# Patient Record
Sex: Female | Born: 1945
Health system: Southern US, Community
[De-identification: ages and names within clinical notes are randomized; demographics above are authoritative.]

## PROBLEM LIST (undated history)

## (undated) DIAGNOSIS — E041 Nontoxic single thyroid nodule: Secondary | ICD-10-CM

## (undated) DIAGNOSIS — R6882 Decreased libido: Secondary | ICD-10-CM

## (undated) DIAGNOSIS — N959 Unspecified menopausal and perimenopausal disorder: Secondary | ICD-10-CM

## (undated) DIAGNOSIS — K5790 Diverticulosis of intestine, part unspecified, without perforation or abscess without bleeding: Secondary | ICD-10-CM

## (undated) DIAGNOSIS — K219 Gastro-esophageal reflux disease without esophagitis: Secondary | ICD-10-CM

## (undated) DIAGNOSIS — IMO0002 Reserved for concepts with insufficient information to code with codable children: Secondary | ICD-10-CM

## (undated) DIAGNOSIS — E785 Hyperlipidemia, unspecified: Secondary | ICD-10-CM

## (undated) DIAGNOSIS — K5792 Diverticulitis of intestine, part unspecified, without perforation or abscess without bleeding: Secondary | ICD-10-CM

## (undated) DIAGNOSIS — Z780 Asymptomatic menopausal state: Secondary | ICD-10-CM

## (undated) DIAGNOSIS — M25511 Pain in right shoulder: Secondary | ICD-10-CM

## (undated) DIAGNOSIS — R011 Cardiac murmur, unspecified: Secondary | ICD-10-CM

## (undated) DIAGNOSIS — H25012 Cortical age-related cataract, left eye: Secondary | ICD-10-CM

## (undated) DIAGNOSIS — M8580 Other specified disorders of bone density and structure, unspecified site: Secondary | ICD-10-CM

## (undated) DIAGNOSIS — Z9109 Other allergy status, other than to drugs and biological substances: Secondary | ICD-10-CM

## (undated) DIAGNOSIS — M810 Age-related osteoporosis without current pathological fracture: Secondary | ICD-10-CM

## (undated) DIAGNOSIS — M549 Dorsalgia, unspecified: Secondary | ICD-10-CM

## (undated) DIAGNOSIS — M1990 Unspecified osteoarthritis, unspecified site: Secondary | ICD-10-CM

## (undated) DIAGNOSIS — J189 Pneumonia, unspecified organism: Secondary | ICD-10-CM

## (undated) DIAGNOSIS — N952 Postmenopausal atrophic vaginitis: Secondary | ICD-10-CM

## (undated) DIAGNOSIS — I10 Essential (primary) hypertension: Secondary | ICD-10-CM

## (undated) DIAGNOSIS — M5030 Other cervical disc degeneration, unspecified cervical region: Secondary | ICD-10-CM

## (undated) DIAGNOSIS — N816 Rectocele: Secondary | ICD-10-CM

## (undated) HISTORY — DX: Rectocele: N81.6

## (undated) HISTORY — DX: Asymptomatic menopausal state: Z78.0

## (undated) HISTORY — DX: Other allergy status, other than to drugs and biological substances: Z91.09

## (undated) HISTORY — DX: Postmenopausal atrophic vaginitis: N95.2

## (undated) HISTORY — PX: COLONOSCOPY: SHX5424

## (undated) HISTORY — DX: Essential (primary) hypertension: I10

## (undated) HISTORY — PX: ESOPHAGOGASTRODUODENOSCOPY: SHX1529

## (undated) HISTORY — PX: FOOT SURGERY: SHX648

## (undated) HISTORY — PX: CATARACT EXTRACTION: SUR2

## (undated) HISTORY — DX: Unspecified osteoarthritis, unspecified site: M19.90

## (undated) HISTORY — PX: CRYOTHERAPY: SHX1416

## (undated) HISTORY — DX: Other specified disorders of bone density and structure, unspecified site: M85.80

## (undated) HISTORY — DX: Reserved for concepts with insufficient information to code with codable children: IMO0002

## (undated) HISTORY — PX: JOINT REPLACEMENT: SHX530

## (undated) HISTORY — DX: Dorsalgia, unspecified: M54.9

## (undated) HISTORY — PX: TUBAL LIGATION: SHX77

## (undated) HISTORY — DX: Diverticulitis of intestine, part unspecified, without perforation or abscess without bleeding: K57.92

## (undated) HISTORY — DX: Decreased libido: R68.82

## (undated) HISTORY — DX: Unspecified menopausal and perimenopausal disorder: N95.9

## (undated) HISTORY — PX: EYE SURGERY: SHX253

## (undated) HISTORY — PX: BUNIONECTOMY: SHX129

---

## 1991-09-09 HISTORY — PX: ESOPHAGOGASTRODUODENOSCOPY: SHX1529

## 1998-09-08 HISTORY — PX: BREAST CYST ASPIRATION: SHX578

## 2007-04-22 ENCOUNTER — Ambulatory Visit: Payer: Self-pay | Admitting: Internal Medicine

## 2007-10-13 ENCOUNTER — Ambulatory Visit: Payer: Self-pay | Admitting: Unknown Physician Specialty

## 2012-08-26 ENCOUNTER — Ambulatory Visit: Payer: Self-pay | Admitting: Obstetrics and Gynecology

## 2013-08-02 ENCOUNTER — Ambulatory Visit: Payer: Self-pay | Admitting: Ophthalmology

## 2013-08-02 LAB — POTASSIUM: Potassium: 4 mmol/L (ref 3.5–5.1)

## 2013-08-17 ENCOUNTER — Ambulatory Visit: Payer: Self-pay | Admitting: Ophthalmology

## 2013-08-29 ENCOUNTER — Ambulatory Visit: Payer: Self-pay | Admitting: Obstetrics and Gynecology

## 2014-07-26 ENCOUNTER — Ambulatory Visit: Payer: Self-pay | Admitting: General Practice

## 2014-07-26 LAB — BASIC METABOLIC PANEL
ANION GAP: 2 — AB (ref 7–16)
BUN: 14 mg/dL (ref 7–18)
CREATININE: 0.67 mg/dL (ref 0.60–1.30)
Calcium, Total: 9.6 mg/dL (ref 8.5–10.1)
Chloride: 102 mmol/L (ref 98–107)
Co2: 33 mmol/L — ABNORMAL HIGH (ref 21–32)
EGFR (African American): >60
Glucose: 90 mg/dL (ref 65–99)
Osmolality: 274 (ref 275–301)
POTASSIUM: 3.9 mmol/L (ref 3.5–5.1)
SODIUM: 137 mmol/L (ref 136–145)

## 2014-07-26 LAB — CBC
HCT: 40 % (ref 35.0–47.0)
HGB: 13.2 g/dL (ref 12.0–16.0)
MCH: 31.8 pg (ref 26.0–34.0)
MCHC: 32.9 g/dL (ref 32.0–36.0)
MCV: 97 fL (ref 80–100)
PLATELETS: 407 10*3/uL (ref 150–440)
RBC: 4.14 10*6/uL (ref 3.80–5.20)
RDW: 13.1 % (ref 11.5–14.5)
WBC: 8.4 10*3/uL (ref 3.6–11.0)

## 2014-07-26 LAB — URINALYSIS, COMPLETE
BILIRUBIN, UR: NEGATIVE
Blood: NEGATIVE
Glucose,UR: NEGATIVE mg/dL (ref 0–75)
Ketone: NEGATIVE
LEUKOCYTE ESTERASE: NEGATIVE
Nitrite: NEGATIVE
PH: 6 (ref 4.5–8.0)
Protein: NEGATIVE
RBC,UR: 1 /HPF (ref 0–5)
Specific Gravity: 1.005 (ref 1.003–1.030)
Squamous Epithelial: <1
WBC UR: NONE SEEN /HPF (ref 0–5)

## 2014-07-26 LAB — SEDIMENTATION RATE: ERYTHROCYTE SED RATE: 18 mm/h (ref 0–30)

## 2014-07-26 LAB — PROTIME-INR
INR: 0.9
PROTHROMBIN TIME: 12.5 s (ref 11.5–14.7)

## 2014-07-26 LAB — MRSA PCR SCREENING

## 2014-07-26 LAB — APTT: ACTIVATED PTT: 30.1 s (ref 23.6–35.9)

## 2014-07-28 LAB — URINE CULTURE

## 2014-08-07 ENCOUNTER — Inpatient Hospital Stay: Payer: Self-pay | Admitting: General Practice

## 2014-08-07 HISTORY — PX: TOTAL HIP ARTHROPLASTY: SHX124

## 2014-08-08 LAB — BASIC METABOLIC PANEL
ANION GAP: 6 — AB (ref 7–16)
BUN: 7 mg/dL (ref 7–18)
CALCIUM: 8.1 mg/dL — AB (ref 8.5–10.1)
Chloride: 107 mmol/L (ref 98–107)
Co2: 29 mmol/L (ref 21–32)
Creatinine: 0.63 mg/dL (ref 0.60–1.30)
EGFR (African American): >60
GLUCOSE: 105 mg/dL — AB (ref 65–99)
Osmolality: 281 (ref 275–301)
Potassium: 3.8 mmol/L (ref 3.5–5.1)
SODIUM: 142 mmol/L (ref 136–145)

## 2014-08-08 LAB — PLATELET COUNT: Platelet: 328 10*3/uL (ref 150–440)

## 2014-08-08 LAB — HEMOGLOBIN: HGB: 10.1 g/dL — ABNORMAL LOW (ref 12.0–16.0)

## 2014-08-09 LAB — BASIC METABOLIC PANEL
Anion Gap: 7 (ref 7–16)
BUN: 14 mg/dL (ref 7–18)
CHLORIDE: 104 mmol/L (ref 98–107)
Calcium, Total: 8.3 mg/dL — ABNORMAL LOW (ref 8.5–10.1)
Co2: 28 mmol/L (ref 21–32)
Creatinine: 0.81 mg/dL (ref 0.60–1.30)
EGFR (African American): >60
EGFR (Non-African Amer.): >60
GLUCOSE: 113 mg/dL — AB (ref 65–99)
OSMOLALITY: 279 (ref 275–301)
POTASSIUM: 3.5 mmol/L (ref 3.5–5.1)
Sodium: 139 mmol/L (ref 136–145)

## 2014-08-09 LAB — PLATELET COUNT: Platelet: 326 10*3/uL (ref 150–440)

## 2014-08-09 LAB — HEMOGLOBIN: HGB: 10.1 g/dL — AB (ref 12.0–16.0)

## 2014-10-05 ENCOUNTER — Ambulatory Visit: Payer: Self-pay | Admitting: Obstetrics and Gynecology

## 2014-10-05 LAB — HM MAMMOGRAPHY

## 2014-12-29 NOTE — Op Note (Signed)
PATIENT NAME:  Christina Foley, Christina Foley MR#:  [id number] DATE OF BIRTH:  11-06-1945  DATE OF PROCEDURE:  08/17/2013  PREOPERATIVE DIAGNOSIS:  Senile cataract left eye.  POSTOPERATIVE DIAGNOSIS:  Senile cataract left eye.  PROCEDURE:  Phacoemulsification with posterior chamber intraocular lens implantation of the left eye.  LENS IMPLANT:  ZCB00 22.5-diopter posterior chamber intraocular lens.  ULTRASOUND TIME:  15% of 1 minute, 5 seconds.  CDE 9.3  SURGEON:  Mali Devetta Hagenow, MD  ANESTHESIA:  Topical with tetracaine drops and 2% Xylocaine jelly.  COMPLICATIONS:  None.  DESCRIPTION OF PROCEDURE:  The patient was identified in the holding room and transported to the operating room and placed in the supine position under the operating microscope.  The left eye was identified as the operative eye and it was prepped and draped in the usual sterile ophthalmic fashion.  A 1 millimeter clear-corneal paracentesis was made at the 1:30 position.  The anterior chamber was filled with Viscoat  viscoelastic.  A 2.4 millimeter keratome was used to make a near-clear corneal incision at the 10:30 position.  A curvilinear capsulorrhexis was made with a cystotome and capsulorrhexis forceps.  Balanced salt solution was used to hydrodissect and hydrodelineate the nucleus.  Phacoemulsification was then used in stop and chop fashion to remove the lens nucleus and epinucleus.  The remaining cortex was then removed using the irrigation and aspiration handpiece. Provisc was then placed into the capsular bag to distend it for lens placement.  A ZCB00 22.5-diopter  lens was then injected into the capsular bag.  The remaining viscoelastic was aspirated.  Wounds were hydrated with balanced salt solution.  The anterior chamber was inflated to a physiologic pressure with balanced salt solution.  0.1 mL of cefuroxime 10 mg/mL were injected into the anterior chamber for a dose of 1 mg of intracameral antibiotic at the completion  of the case. Miostat was placed into the anterior chamber to constrict the pupil.  No wound leaks were noted.  Topical Vigamox drops and Maxitrol ointment were applied to the eye.    The patient was taken to the recovery room in stable condition without complications of anesthesia or surgery.   ____________________________ Wyonia Hough, MD crb:lb D: 08/17/2013 14:31:00 ET T: 08/17/2013 14:47:04 ET JOB#: [id number]  cc: Wyonia Hough, MD, <Dictator> Leandrew Koyanagi MD ELECTRONICALLY SIGNED 08/24/2013 13:46

## 2014-12-30 NOTE — Op Note (Signed)
PATIENT NAME:  Christina Foley, Christina Foley MR#:  [id number] DATE OF BIRTH:  1945/10/11  DATE OF PROCEDURE:  08/07/2014  PREOPERATIVE DIAGNOSIS: Degenerative arthrosis of the right hip (primary).   POSTOPERATIVE DIAGNOSIS: Degenerative arthrosis of the right hip (primary).   PROCEDURE PERFORMED: Right total hip arthroplasty.   SURGEON: Skip Estimable, MD   ASSISTANT:   Vance Peper, PA (required to maintain retraction throughout the procedure)   ANESTHESIA: Spinal.   ESTIMATED BLOOD LOSS: 125 mL.   FLUIDS REPLACED: 2000 mL of crystalloid.   DRAINS:  Two medium drains, Hemovac reservoir.   IMPLANTS UTILIZED:  DePuy 12 mm small stature AML femoral stem, 50 mm outer diameter Pinnacle 100 acetabular component, +4 mm neutral Pinnacle Marathon polyethylene liner, and a 32 mm cobalt chrome hip ball with a +1 mm neck length.   INDICATIONS FOR SURGERY:  The patient is a 69 year old female who has been seen for complaints of progressive right hip and groin pain.  She has noted some decrease in her hip range of motion.  X-rays demonstrated severe degenerative changes with full-thickness loss of articular cartilage.  After discussion of the risks and benefits of surgical intervention, the patient expressed understanding of the risks, benefits, and agreed with plans for surgical intervention.   PROCEDURE IN DETAIL: The patient was brought into the Operating Room and, after adequate spinal anesthesia was achieved, the patient was placed in a left lateral decubitus position. Axillary roll was placed, and all bony prominences were well padded. The patient's right hip and leg were cleaned and prepped with alcohol and DuraPrep draped in the usual sterile fashion.  A "timeout" was performed as per usual protocol.    A lateral curvilinear incision was made, gently curving towards the posterior superior iliac spine.  IT band was incised along with the skin incision, and fibers of the gluteus maximus were split in line.  The piriformis tendon was identified, skeletonized, incised at its insertion on the proximal femur and reflected posteriorly. In a similar fashion, short external rotators were incised and reflected posteriorly. A T-type posterior capsulotomy was performed. Prior to dislocation of the femoral head, a threaded Steinmann pin was inserted through a separate stab incision into the pelvis superior to the acetabulum and then bent in the form of a stylus so as to assess limb length and hip offset throughout the procedure.   The femoral head was then dislocated posteriorly.  Inspection of the femoral head demonstrated a full-thickness loss of articular cartilage superiorly. The femoral neck cut was performed using an oscillating saw.  Attention was then directed to the acetabulum. The labrum was excised using electrocautery. The acetabulum was reamed in a sequential fashion up to a 49 mm diameter.  Good punctate bleeding bone was encountered. A 50 mm outer diameter Pinnacle 100 acetabular component was positioned and impacted into place. Excellent scratch fit was appreciated.  A +4 neutral polyethylene trial was inserted and attention was directed to the proximal femur.  Pilot hole for reaming of the proximal femoral canal was performed using a high-speed bur.  Proximal femoral canal was then reamed in a sequential fashion up to an 11.5 mm diameter.  This allowed for approximately 5.5 to 6 cm of scratch fit.   The proximal portion of the femur was then prepared using a 12 mm aggressive side-biting reamer. Serial broaches were inserted up to a 10 mm small stature broach. The calcar region was planed and trial reduction was performed with a 32 mm hip ball with  a +1 mm neck length. This allowed for excellent equalization of limb lengths and improved hip offset as well as excellent stability both anteriorly and posteriorly. Trial components were removed.   A silver shell was irrigated with normal saline with antibiotic  solution. A+ 4 mm neutral Pinnacle Marathon polyethylene liner was positioned and impacted into place.  Next, a 12 mm small stature AML femoral component was positioned and impacted into place.  Excellent scratch fit was appreciated. Trial reduction was again performed with a 32 mm hip ball with a +1 mm neck length.  Again, excellent equalization of limb lengths and hip offset was appreciated.  Excellent stability was noted both anteriorly and posteriorly.  Trial hip ball was removed.    The Morse  taper was cleaned and dried and a 32 mm cobalt chrome hip ball with a +1 mm neck length was placed on the trunnion and impacted into place. The hip was reduced and placed through a range of motion again with excellent stability appreciated both anteriorly and posteriorly.    The wound was irrigated with copious amounts of normal saline with antibiotic solution using pulsatile lavage and then suctioned dry.  Good hemostasis was appreciated. The posterior capsulotomy was repaired using #5 Ethibond. The piriformis tendon was reapproximated on the surface of the gluteus medius tendon using #5 Ethibond.  Two medium drains were placed in the wound bed and brought out through a separate stab incision and reattached to a Hemovac reservoir.  IT band was repaired using interrupted sutures of #1 Vicryl. The subcutaneous tissue was approximated in layers using first #0 Vicryl followed by 2-0 Vicryl. Skin was closed with skin staples. A sterile dressing was applied.   The patient tolerated the procedure well. She was transported to the recovery room in stable condition.      ____________________________ Laurice Record. Holley Bouche., MD jph:DT D: 08/07/2014 11:03:02 ET T: 08/07/2014 13:32:54 ET JOB#: [id number]  cc: Laurice Record. Holley Bouche., MD, <Dictator> JAMES P Holley Bouche MD ELECTRONICALLY SIGNED 08/11/2014 12:55

## 2014-12-30 NOTE — Discharge Summary (Signed)
PATIENT NAME:  Christina Foley, PONTILLO MR#:  [id number] DATE OF BIRTH:  03-10-1946  DATE OF ADMISSION:  08/07/2014 DATE OF DISCHARGE:  08/10/2014  ADMITTING DIAGNOSIS: Degenerative arthrosis of the right hip.   DISCHARGE DIAGNOSIS: Degenerative arthrosis of the right hip.   OPERATION: On 08/07/2014 the patient had a right total hip arthroplasty.   SURGEON: Skip Estimable, MD   ASSISTANT: Vance Peper, PA.   ANESTHESIA: Spinal.   ESTIMATED BLOOD LOSS: 125 mL.   IMPLANTS USED: DePuy 12 mm small stature AML femoral stem, 50 mm outer diameter Pinnacle 100 acetabular component, +4 mm neutral Pinnacle Marathon polyethylene liner, 32 mm cobalt chrome hip ball with a +1 mm neck length. The patient was stabilized, brought to the recovery room, and then brought down to the orthopedic floor.   HISTORY: The patient is a 69 year old female who presented for persistent pain involving her right hip. The patient has had activity modification with weightbearing activities. The patient has had decreased motion has tried is any articular injections with no relief.   PHYSICAL EXAMINATION:  GENERAL: An alert female with an antalgic gait involving her right lower extremity with no evidence of abductor lurch.   LUNGS: Clear to auscultation.  CARDIOVASCULAR: Regular rate and rhythm.  MUSCULOSKELETAL: In regard to the right hip, the patient has no soft tissue swelling or crepitus. The patient does have some tenderness anteriorly and with the axial load test. The patient has range of motion with full extension to 110 degrees flexion with 30 degrees abduction and adduction. The patient has 20 degrees internal rotation and 30 degrees of external rotation.   HOSPITAL COURSE: After initial admission on 08/07/2014, the patient was brought to the orthopedic floor. On postoperative day 1, the patient had a hemoglobin of 10.1, which remained stable at 10.1 the following day. The patient received no transfusions. The patient  worked with physical therapy, initially ambulating bed to chair and progressively ambulating 400 feet including stairs on the day before discharge. The patient was ready to go home with home health and physical therapy.   DISCHARGE INSTRUCTIONS: The patient will followup with [facility] orthopedics on 09/19/2014. The patient will do weight bear as tolerated on the affected leg. The patient will elevate her leg with 1-2 pillows and use posterior hip precautions. The patient will use knee-high TED hose on both legs, removed at bedtime. The patient will elevate her heels off the bed and use incentive spirometer. The patient was encouraged to do cough and deep breathing. The patient will do a regular diet. The patient will try not to get her dressing dirty or wet. The patient will have a dressing change as needed with physical therapy. The patient will have her staples removed and benzoin and Steri-Strips applied with physical therapy. The patient will call the clinic if there is any bright red bleeding, calf pain, bowel or bladder difficulty, or fever greater than 101.5. The patient will be doing home health physical therapy working on gait training and strengthening.   DISCHARGE MEDICATIONS: Resume home medications and to add tramadol 50 mg 1-2 tablets every 4 hours as needed for mild to moderate pain, Oxycodone 5 mg 1-2 tablets every 4 hours as needed for severe pain, and Lovenox 30 mg subcutaneous every 12 hours for 14 days and then discontinue.    ____________________________ Lenna Sciara. Reche Dixon, [place] jtm:bm D: 08/10/2014 06:33:36 ET T: 08/10/2014 07:31:43 ET JOB#: [id number]  cc: J. Reche Dixon, [place], <Dictator> J Selim Durden [facility] PA ELECTRONICALLY SIGNED 08/15/2014  9:57 

## 2015-01-01 LAB — SURGICAL PATHOLOGY

## 2015-01-23 LAB — HM PAP SMEAR: HM PAP: NEGATIVE

## 2015-09-21 ENCOUNTER — Other Ambulatory Visit: Payer: Self-pay | Admitting: Obstetrics and Gynecology

## 2015-09-21 DIAGNOSIS — Z1231 Encounter for screening mammogram for malignant neoplasm of breast: Secondary | ICD-10-CM

## 2015-09-21 DIAGNOSIS — X32XXXA Exposure to sunlight, initial encounter: Secondary | ICD-10-CM | POA: Diagnosis not present

## 2015-09-21 DIAGNOSIS — L570 Actinic keratosis: Secondary | ICD-10-CM | POA: Diagnosis not present

## 2015-09-21 DIAGNOSIS — D2261 Melanocytic nevi of right upper limb, including shoulder: Secondary | ICD-10-CM | POA: Diagnosis not present

## 2015-09-25 DIAGNOSIS — Z96641 Presence of right artificial hip joint: Secondary | ICD-10-CM | POA: Diagnosis not present

## 2015-10-08 ENCOUNTER — Ambulatory Visit
Admission: RE | Admit: 2015-10-08 | Discharge: 2015-10-08 | Disposition: A | Discharge status: Home / Self Care | Payer: PPO | Source: Ambulatory Visit | Attending: Obstetrics and Gynecology | Admitting: Obstetrics and Gynecology

## 2015-10-08 DIAGNOSIS — Z1231 Encounter for screening mammogram for malignant neoplasm of breast: Secondary | ICD-10-CM | POA: Diagnosis not present

## 2015-11-16 DIAGNOSIS — D2271 Melanocytic nevi of right lower limb, including hip: Secondary | ICD-10-CM | POA: Diagnosis not present

## 2015-11-16 DIAGNOSIS — H61031 Chondritis of right external ear: Secondary | ICD-10-CM | POA: Diagnosis not present

## 2015-11-16 DIAGNOSIS — L820 Inflamed seborrheic keratosis: Secondary | ICD-10-CM | POA: Diagnosis not present

## 2015-11-16 DIAGNOSIS — D225 Melanocytic nevi of trunk: Secondary | ICD-10-CM | POA: Diagnosis not present

## 2016-01-07 DIAGNOSIS — M7542 Impingement syndrome of left shoulder: Secondary | ICD-10-CM | POA: Diagnosis not present

## 2016-01-07 DIAGNOSIS — M25512 Pain in left shoulder: Secondary | ICD-10-CM | POA: Diagnosis not present

## 2016-01-29 ENCOUNTER — Encounter: Payer: Self-pay | Admitting: Obstetrics and Gynecology

## 2016-02-07 ENCOUNTER — Encounter: Payer: Self-pay | Admitting: Obstetrics and Gynecology

## 2016-02-07 ENCOUNTER — Ambulatory Visit (INDEPENDENT_AMBULATORY_CARE_PROVIDER_SITE_OTHER): Payer: PPO | Admitting: Obstetrics and Gynecology

## 2016-02-07 VITALS — BP 149/85 | HR 72 | Ht 63.0 in | Wt 139.6 lb

## 2016-02-07 DIAGNOSIS — K5790 Diverticulosis of intestine, part unspecified, without perforation or abscess without bleeding: Secondary | ICD-10-CM | POA: Diagnosis not present

## 2016-02-07 DIAGNOSIS — I10 Essential (primary) hypertension: Secondary | ICD-10-CM | POA: Diagnosis not present

## 2016-02-07 DIAGNOSIS — R888 Abnormal findings in other body fluids and substances: Secondary | ICD-10-CM

## 2016-02-07 DIAGNOSIS — Z0000 Encounter for general adult medical examination without abnormal findings: Secondary | ICD-10-CM

## 2016-02-07 DIAGNOSIS — B977 Papillomavirus as the cause of diseases classified elsewhere: Secondary | ICD-10-CM | POA: Diagnosis not present

## 2016-02-07 DIAGNOSIS — N762 Acute vulvitis: Secondary | ICD-10-CM

## 2016-02-07 DIAGNOSIS — Z01419 Encounter for gynecological examination (general) (routine) without abnormal findings: Secondary | ICD-10-CM

## 2016-02-07 DIAGNOSIS — IMO0002 Reserved for concepts with insufficient information to code with codable children: Secondary | ICD-10-CM | POA: Insufficient documentation

## 2016-02-07 DIAGNOSIS — Z1211 Encounter for screening for malignant neoplasm of colon: Secondary | ICD-10-CM

## 2016-02-07 DIAGNOSIS — M8580 Other specified disorders of bone density and structure, unspecified site: Secondary | ICD-10-CM | POA: Insufficient documentation

## 2016-02-07 DIAGNOSIS — Z780 Asymptomatic menopausal state: Secondary | ICD-10-CM | POA: Insufficient documentation

## 2016-02-07 DIAGNOSIS — N952 Postmenopausal atrophic vaginitis: Secondary | ICD-10-CM | POA: Diagnosis not present

## 2016-02-07 MED ORDER — CLOBETASOL PROPIONATE 0.05 % EX OINT: 1.0000 "application " | TOPICAL_OINTMENT | Freq: Two times a day (BID) | CUTANEOUS | Status: DC

## 2016-02-07 MED ORDER — CLOBETASOL PROPIONATE 0.05 % EX OINT
1.0000 | TOPICAL_OINTMENT | Freq: Two times a day (BID) | CUTANEOUS | Status: DC
Start: 2016-02-07 — End: 2016-02-07

## 2016-02-07 NOTE — Progress Notes (Signed)
GYN ANNUAL PREVENTATIVE CARE ENCOUNTER NOTE  Subjective:       Christina Foley is a 70 y.o. G3P2002 female here for a routine annual gynecologic exam.  Current complaints: None  Currently using Estrace vaginal cream for atrophy..     Gynecologic History No LMP recorded. Patient is postmenopausal. Contraception: post menopausal status Last Pap: . Results were: normal Last mammogram: 10/08/2015. Results were: normal  vitamins 1  Obstetric History OB History  Gravida Para Term Preterm AB SAB TAB Ectopic Multiple Living  2 2 2       2     # Outcome Date GA Lbr Len/2nd Weight Sex Delivery Anes PTL Lv  2 Term      Vag-Spont   Y  1 Term      Vag-Spont   Y      Past Medical History  Diagnosis Date  . Osteoarthritis   . Environmental allergies   . Back pain   . Hypertension   . Diverticulitis   . HPV test positive   . Postmenopausal atrophic vaginitis   . Menopause   . Osteopenia   . Menopausal and postmenopausal disorder   . Rectocele   . Decreased libido     Past Surgical History  Procedure Laterality Date  . Eye surgery      cataract   . Breast cyst aspiration Bilateral 2000    benign  . Foot surgery    . Cryotherapy      Current Outpatient Prescriptions on File Prior to Visit  Medication Sig Dispense Refill  . calcium carbonate (OS-CAL - DOSED IN MG OF ELEMENTAL CALCIUM) 1250 (500 Ca) MG tablet Take by mouth.    . estradiol (ESTRACE VAGINAL) 0.1 MG/GM vaginal cream Place vaginally.    Marland Kitchen lisinopril-hydrochlorothiazide (PRINZIDE,ZESTORETIC) 20-12.5 MG tablet Take by mouth.    . Loratadine 10 MG CAPS Take by mouth.    . vitamin C (ASCORBIC ACID) 500 MG tablet Take by mouth.     No current facility-administered medications on file prior to visit.    Allergies no known allergies  Social History   Social History  . Marital Status: Married    Spouse Name: N/A  . Number of Children: N/A  . Years of Education: N/A   Occupational History  . Not on file.    Social History Main Topics  . Smoking status: Never Smoker   . Smokeless tobacco: Not on file  . Alcohol Use: Yes     Comment: wine q nite  . Drug Use: No  . Sexual Activity: Yes   Other Topics Concern  . Not on file   Social History Narrative    Family History  Problem Relation Age of Onset  . Breast cancer Maternal Aunt 70  . Thyroid cancer Mother   . Heart disease Father   . Depression Paternal Grandmother   . Ovarian cancer Neg Hx     The following portions of the patient's history were reviewed and updated as appropriate: allergies, current medications, past family history, past medical history, past social history, past surgical history and problem list.  Review of Systems Review of Systems  Constitutional: Negative.  Negative for fever, chills, weight loss and malaise/fatigue.  HENT: Negative.   Eyes: Negative.   Respiratory: Negative.   Cardiovascular: Negative.   Gastrointestinal: Negative.   Genitourinary: Negative.   Musculoskeletal: Negative.   Skin: Negative for itching and rash.  Endo/Heme/Allergies: Negative.   Psychiatric/Behavioral: Negative.      Objective:  Physical Exam  Constitutional: She is oriented to person, place, and time. She appears well-developed and well-nourished.  HENT:  Head: Normocephalic and atraumatic.  Eyes: Conjunctivae and EOM are normal. No scleral icterus.  Neck: Normal range of motion. Neck supple. No thyromegaly present.  Cardiovascular: Normal rate and regular rhythm.   No murmur heard. Pulmonary/Chest: Breath sounds normal.  Abdominal: Soft. Bowel sounds are normal. She exhibits no distension and no mass. There is no tenderness. No hernia.  Genitourinary:  External genitalia-moderate vulvar hyperemia without epithelial skin breakdown or leukoplakia, symmetric BUS normal Vagina-fair estrogen effect Cervix-no lesions Uterus-midplane, normal size and shape, mobile, nontender Adnexa-nonpalpable and  nontender Rectovaginal-normal external exam; normal sphincter tone; no rectal masses  Musculoskeletal: Normal range of motion. She exhibits no edema or tenderness.  Lymphadenopathy:    She has no cervical adenopathy.  Neurological: She is alert and oriented to person, place, and time.  Skin: Skin is warm and dry. No rash noted. No erythema.  Psychiatric: She has a normal mood and affect. Her behavior is normal.    Assessment:   Annual gynecologic examination 70 y.o. Contraception: post menopausal status Normal BMI There are no active problems to display for this patient.    Plan:  Pap: Not needed and Not done Mammogram: Ordered Labs: Lipid 1, FBS, TSH, Hemoglobin A1C and Vit D Level"". Routine preventative health maintenance measures emphasized: Diet/Weight control, Tobacco Cessation and Alcohol/Drug use Refill Estrace cream; continue intravaginal twice weekly Temovate ointment 0.05% to be applied topically to the vulva twice a day for 1 week, then once a day for 1 week. If symptoms persist over the next 4 weeks, return for evaluation and possible biopsy Return to [place], [place]   Note: This dictation was prepared with Dragon dictation along with smaller phrase technology. Any transcriptional errors that result from this process are unintentional.

## 2016-02-07 NOTE — Patient Instructions (Signed)
1. No Pap smear is needed until 2019 2. Mammogram is to be done in January 2018 3. Stool guaiac cards for colon cancer screening given 4. Screening blood work is to be done by Dr. Emily Filbert 5. Estrace cream is refilled; continue using it twice a week intravaginal 6. Temovate ointment 0.05% is to be applied topically to the vulva twice a day for 1 week, then once a day for 1 week for vulvar irritation 7. If vulvar symptoms persist over the next month, return for follow-up 8. Return in 1 year for routine physical

## 2016-02-12 ENCOUNTER — Telehealth: Payer: Self-pay | Admitting: Obstetrics and Gynecology

## 2016-02-12 MED ORDER — ESTRADIOL 0.1 MG/GM VA CREA: 0.5000 | TOPICAL_CREAM | VAGINAL | Status: DC

## 2016-02-12 NOTE — Telephone Encounter (Signed)
SHE GOT MEDICATION FROM PHARMACY/ THE INSTRUCTIONS WERE DIFFERENT THAN WHAT DR DE SAID. SHE WANTS CLARIFICATION. AND SHE HAD A REACTION WITH HER LUBRICANT AND WANTS TO KNOW IF THERE IS A HYPO-ALERGINIC LUBRICANT.

## 2016-02-12 NOTE — Telephone Encounter (Signed)
Pts questions answered. Refilled estrace. Recommended JH20 gel.

## 2016-04-28 DIAGNOSIS — Z0000 Encounter for general adult medical examination without abnormal findings: Secondary | ICD-10-CM | POA: Diagnosis not present

## 2016-05-05 DIAGNOSIS — N951 Menopausal and female climacteric states: Secondary | ICD-10-CM | POA: Diagnosis not present

## 2016-05-05 DIAGNOSIS — Z23 Encounter for immunization: Secondary | ICD-10-CM | POA: Diagnosis not present

## 2016-05-05 DIAGNOSIS — Z0000 Encounter for general adult medical examination without abnormal findings: Secondary | ICD-10-CM | POA: Diagnosis not present

## 2016-05-06 DIAGNOSIS — Z1211 Encounter for screening for malignant neoplasm of colon: Secondary | ICD-10-CM | POA: Diagnosis not present

## 2016-05-09 LAB — FECAL OCCULT BLOOD, IMMUNOCHEMICAL: FECAL OCCULT BLD: NEGATIVE

## 2016-05-13 ENCOUNTER — Telehealth: Payer: Self-pay | Admitting: Obstetrics and Gynecology

## 2016-05-13 NOTE — Telephone Encounter (Signed)
ERROR

## 2016-05-21 DIAGNOSIS — N951 Menopausal and female climacteric states: Secondary | ICD-10-CM | POA: Diagnosis not present

## 2016-07-11 DIAGNOSIS — H2511 Age-related nuclear cataract, right eye: Secondary | ICD-10-CM | POA: Diagnosis not present

## 2016-08-15 DIAGNOSIS — B078 Other viral warts: Secondary | ICD-10-CM | POA: Diagnosis not present

## 2016-08-15 DIAGNOSIS — D485 Neoplasm of uncertain behavior of skin: Secondary | ICD-10-CM | POA: Diagnosis not present

## 2016-08-15 DIAGNOSIS — D0439 Carcinoma in situ of skin of other parts of face: Secondary | ICD-10-CM | POA: Diagnosis not present

## 2016-09-11 DIAGNOSIS — M818 Other osteoporosis without current pathological fracture: Secondary | ICD-10-CM | POA: Diagnosis not present

## 2016-09-11 DIAGNOSIS — K219 Gastro-esophageal reflux disease without esophagitis: Secondary | ICD-10-CM | POA: Diagnosis not present

## 2016-09-12 ENCOUNTER — Other Ambulatory Visit: Payer: Self-pay | Admitting: Obstetrics and Gynecology

## 2016-09-12 DIAGNOSIS — Z1231 Encounter for screening mammogram for malignant neoplasm of breast: Secondary | ICD-10-CM

## 2016-09-16 DIAGNOSIS — Z96641 Presence of right artificial hip joint: Secondary | ICD-10-CM | POA: Diagnosis not present

## 2016-10-14 ENCOUNTER — Ambulatory Visit
Admission: RE | Admit: 2016-10-14 | Discharge: 2016-10-14 | Disposition: A | Discharge status: Home / Self Care | Payer: PPO | Source: Ambulatory Visit | Attending: Obstetrics and Gynecology | Admitting: Obstetrics and Gynecology

## 2016-10-14 DIAGNOSIS — Z1231 Encounter for screening mammogram for malignant neoplasm of breast: Secondary | ICD-10-CM

## 2016-11-17 DIAGNOSIS — B078 Other viral warts: Secondary | ICD-10-CM | POA: Diagnosis not present

## 2016-11-17 DIAGNOSIS — D225 Melanocytic nevi of trunk: Secondary | ICD-10-CM | POA: Diagnosis not present

## 2016-11-17 DIAGNOSIS — R208 Other disturbances of skin sensation: Secondary | ICD-10-CM | POA: Diagnosis not present

## 2016-11-17 DIAGNOSIS — Z85828 Personal history of other malignant neoplasm of skin: Secondary | ICD-10-CM | POA: Diagnosis not present

## 2016-11-17 DIAGNOSIS — D2261 Melanocytic nevi of right upper limb, including shoulder: Secondary | ICD-10-CM | POA: Diagnosis not present

## 2016-11-17 DIAGNOSIS — L570 Actinic keratosis: Secondary | ICD-10-CM | POA: Diagnosis not present

## 2017-01-02 DIAGNOSIS — M25511 Pain in right shoulder: Secondary | ICD-10-CM | POA: Diagnosis not present

## 2017-01-02 DIAGNOSIS — M7521 Bicipital tendinitis, right shoulder: Secondary | ICD-10-CM | POA: Diagnosis not present

## 2017-03-10 NOTE — Progress Notes (Signed)
GYN ANNUAL PREVENTATIVE CARE ENCOUNTER NOTE  Subjective:       Christina Foley is a 71 y.o. G38P2002 female here for a routine annual gynecologic exam.  Current complaints: None  Currently using Estrace vaginal cream once a week for atrophy.  Major interval health issues include possible diagnosis of osteopenia from DEXA scan ordered by Dr. Emily Filbert; patient tried Fosamax but had to discontinue it due to GERD symptoms. Patient is not currently interested in alternative regimen such as Evista, Miacalcin nasal spray, or hormone replacement therapy. Patient is exercising and taking calcium with vitamin D supplementation  Bowel and bladder function are normal.   Gynecologic History No LMP recorded. Patient is postmenopausal. Contraception: post menopausal status Last Pap: . 01/2015 neg/neg Results were: normal Last mammogram: 10/2016 birad 1Results were: normal    Obstetric History OB History  Gravida Para Term Preterm AB Living  2 2 2     2   SAB TAB Ectopic Multiple Live Births          2    # Outcome Date GA Lbr Len/2nd Weight Sex Delivery Anes PTL Lv  2 Term      Vag-Spont   LIV  1 Term      Vag-Spont   LIV      Past Medical History:  Diagnosis Date  . Back pain   . Decreased libido   . Diverticulitis   . Environmental allergies   . HPV test positive   . Hypertension   . Menopausal and postmenopausal disorder   . Menopause   . Osteoarthritis   . Osteopenia   . Postmenopausal atrophic vaginitis   . Rectocele     Past Surgical History:  Procedure Laterality Date  . BREAST CYST ASPIRATION Bilateral 2000   benign  . CRYOTHERAPY    . EYE SURGERY     cataract   . FOOT SURGERY      Current Outpatient Prescriptions on File Prior to Visit  Medication Sig Dispense Refill  . ALPRAZolam (XANAX) 0.25 MG tablet Take by mouth.    . calcium carbonate (OS-CAL - DOSED IN MG OF ELEMENTAL CALCIUM) 1250 (500 Ca) MG tablet Take by mouth.    . clobetasol ointment  (TEMOVATE) 1.49 % Apply 1 application topically 2 (two) times daily. Twice a week for 1 week, once a week for 1 week 30 g 0  . diphenoxylate-atropine (LOMOTIL) 2.5-0.025 MG tablet Take by mouth.    . estradiol (ESTRACE VAGINAL) 0.1 MG/GM vaginal cream Place 0.5 Applicatorfuls vaginally 2 (two) times a week. 42.5 g 1  . lisinopril-hydrochlorothiazide (PRINZIDE,ZESTORETIC) 20-12.5 MG tablet Take by mouth.    . Loratadine 10 MG CAPS Take by mouth.    . vitamin C (ASCORBIC ACID) 500 MG tablet Take by mouth.    . zolpidem (AMBIEN) 10 MG tablet Take by mouth.     No current facility-administered medications on file prior to visit.     No Known Allergies  Social History   Social History  . Marital status: Married    Spouse name: N/A  . Number of children: N/A  . Years of education: N/A   Occupational History  . Not on file.   Social History Main Topics  . Smoking status: Never Smoker  . Smokeless tobacco: Not on file  . Alcohol use Yes     Comment: wine q nite  . Drug use: No  . Sexual activity: Yes   Other Topics Concern  . Not on file  Social History Narrative  . No narrative on file    Family History  Problem Relation Age of Onset  . Breast cancer Maternal Aunt 70  . Thyroid cancer Mother   . Heart disease Father   . Depression Paternal Grandmother   . Ovarian cancer Neg Hx     The following portions of the patient's history were reviewed and updated as appropriate: allergies, current medications, past family history, past medical history, past social history, past surgical history and problem list.  Review of Systems Review of Systems  Constitutional: Negative.   HENT: Negative.   Eyes: Negative.   Respiratory: Negative.   Cardiovascular: Negative.   Gastrointestinal: Positive for heartburn.       History of GERD  Genitourinary: Negative.   Musculoskeletal: Negative.   Skin: Negative.   Neurological: Negative.   Endo/Heme/Allergies: Negative.    Psychiatric/Behavioral: Negative.      Objective:   BP (!) 148/70   Pulse 66   Ht 5\' 3"  (1.6 m)   Wt 140 lb 4.8 oz (63.6 kg)   BMI 24.85 kg/m   Physical Exam  Constitutional: She is oriented to person, place, and time. She appears well-developed and well-nourished.  HENT:  Head: Normocephalic and atraumatic.  Eyes: Conjunctivae and EOM are normal. No scleral icterus.  Neck: Normal range of motion. Neck supple. No thyromegaly present.  Cardiovascular: Normal rate and regular rhythm.   Murmur heard. 2/6 systolic ejection murmur  Pulmonary/Chest: Breath sounds normal.  Abdominal: Soft. Bowel sounds are normal. She exhibits no distension and no mass. There is no tenderness. No hernia.  Genitourinary:  Genitourinary Comments: External genitalia-mild vulvar hyperemia without epithelial skin breakdown or leukoplakia, symmetric BUS normal Vagina-fair estrogen effect Cervix-no lesions Uterus-midplane, normal size and shape, mobile, nontender Adnexa-nonpalpable and nontender Rectovaginal-normal external exam; normal sphincter tone; no rectal masses  Musculoskeletal: Normal range of motion. She exhibits no edema or tenderness.  Lymphadenopathy:    She has no cervical adenopathy.  Neurological: She is alert and oriented to person, place, and time.  Skin: Skin is warm and dry. No rash noted. No erythema.  Psychiatric: She has a normal mood and affect. Her behavior is normal.    Assessment:   Annual gynecologic examination 71 y.o. Contraception: post menopausal status Normal BMI Patient Active Problem List   Diagnosis Date Noted  . Vaginal atrophy 02/07/2016  . Vulvitis 02/07/2016  . Menopause 02/07/2016  . Osteopenia 02/07/2016  . HPV test positive 02/07/2016  . Essential hypertension 02/07/2016  . Diverticulosis 02/07/2016    Plan:  Pap: Not needed and Not done Mammogram: utd Labs:thru pcp Routine preventative health maintenance measures emphasized: Diet/Weight  control, Tobacco Cessation and Alcohol/Drug use Refill Estrace cream; continue intravaginal twice weekly Continue with calcium and vitamin D supplementation; alternative medication management for osteopenia was reviewed Return to [place], CMA  Brayton Mars, MD   Note: This dictation was prepared with Dragon dictation along with smaller phrase technology. Any transcriptional errors that result from this process are unintentional.

## 2017-03-17 ENCOUNTER — Encounter: Payer: Self-pay | Admitting: Obstetrics and Gynecology

## 2017-03-17 ENCOUNTER — Ambulatory Visit (INDEPENDENT_AMBULATORY_CARE_PROVIDER_SITE_OTHER): Payer: PPO | Admitting: Obstetrics and Gynecology

## 2017-03-17 VITALS — BP 148/70 | HR 66 | Ht 63.0 in | Wt 140.3 lb

## 2017-03-17 DIAGNOSIS — M8580 Other specified disorders of bone density and structure, unspecified site: Secondary | ICD-10-CM | POA: Diagnosis not present

## 2017-03-17 DIAGNOSIS — Z780 Asymptomatic menopausal state: Secondary | ICD-10-CM | POA: Diagnosis not present

## 2017-03-17 DIAGNOSIS — K5790 Diverticulosis of intestine, part unspecified, without perforation or abscess without bleeding: Secondary | ICD-10-CM

## 2017-03-17 DIAGNOSIS — N952 Postmenopausal atrophic vaginitis: Secondary | ICD-10-CM

## 2017-03-17 DIAGNOSIS — Z1211 Encounter for screening for malignant neoplasm of colon: Secondary | ICD-10-CM | POA: Diagnosis not present

## 2017-03-17 DIAGNOSIS — I10 Essential (primary) hypertension: Secondary | ICD-10-CM | POA: Diagnosis not present

## 2017-03-17 DIAGNOSIS — Z01419 Encounter for gynecological examination (general) (routine) without abnormal findings: Secondary | ICD-10-CM

## 2017-03-17 MED ORDER — ESTRADIOL 0.1 MG/GM VA CREA: 0.5000 | TOPICAL_CREAM | VAGINAL | 6 refills | Status: AC

## 2017-03-17 NOTE — Patient Instructions (Signed)
1. No Pap smear needed 2. Mammogram already completed 3. Stool guaiac cards are given for colon cancer screening 4. Continue with calcium and vitamin D supplementation 5. Continue with exercise, weightbearing, and healthy dieting 6. Return in 1 year for annual exam   Health Maintenance for Postmenopausal Women Menopause is a normal process in which your reproductive ability comes to an end. This process happens gradually over a span of months to years, usually between the ages of 45 and 15. Menopause is complete when you have missed 12 consecutive menstrual periods. It is important to talk with your health care provider about some of the most common conditions that affect postmenopausal women, such as heart disease, cancer, and bone loss (osteoporosis). Adopting a healthy lifestyle and getting preventive care can help to promote your health and wellness. Those actions can also lower your chances of developing some of these common conditions. What should I know about menopause? During menopause, you may experience a number of symptoms, such as:  Moderate-to-severe hot flashes.  Night sweats.  Decrease in sex drive.  Mood swings.  Headaches.  Tiredness.  Irritability.  Memory problems.  Insomnia.  Choosing to treat or not to treat menopausal changes is an individual decision that you make with your health care provider. What should I know about hormone replacement therapy and supplements? Hormone therapy products are effective for treating symptoms that are associated with menopause, such as hot flashes and night sweats. Hormone replacement carries certain risks, especially as you become older. If you are thinking about using estrogen or estrogen with progestin treatments, discuss the benefits and risks with your health care provider. What should I know about heart disease and stroke? Heart disease, heart attack, and stroke become more likely as you age. This may be due, in part, to  the hormonal changes that your body experiences during menopause. These can affect how your body processes dietary fats, triglycerides, and cholesterol. Heart attack and stroke are both medical emergencies. There are many things that you can do to help prevent heart disease and stroke:  Have your blood pressure checked at least every 1-2 years. High blood pressure causes heart disease and increases the risk of stroke.  If you are 26-47 years old, ask your health care provider if you should take aspirin to prevent a heart attack or a stroke.  Do not use any tobacco products, including cigarettes, chewing tobacco, or electronic cigarettes. If you need help quitting, ask your health care provider.  It is important to eat a healthy diet and maintain a healthy weight. ? Be sure to include plenty of vegetables, fruits, low-fat dairy products, and lean protein. ? Avoid eating foods that are high in solid fats, added sugars, or salt (sodium).  Get regular exercise. This is one of the most important things that you can do for your health. ? Try to exercise for at least 150 minutes each week. The type of exercise that you do should increase your heart rate and make you sweat. This is known as moderate-intensity exercise. ? Try to do strengthening exercises at least twice each week. Do these in addition to the moderate-intensity exercise.  Know your numbers.Ask your health care provider to check your cholesterol and your blood glucose. Continue to have your blood tested as directed by your health care provider.  What should I know about cancer screening? There are several types of cancer. Take the following steps to reduce your risk and to catch any cancer development as early  as possible. Breast Cancer  Practice breast self-awareness. ? This means understanding how your breasts normally appear and feel. ? It also means doing regular breast self-exams. Let your health care provider know about any  changes, no matter how small.  If you are 40 or older, have a clinician do a breast exam (clinical breast exam or CBE) every year. Depending on your age, family history, and medical history, it may be recommended that you also have a yearly breast X-ray (mammogram).  If you have a family history of breast cancer, talk with your health care provider about genetic screening.  If you are at high risk for breast cancer, talk with your health care provider about having an MRI and a mammogram every year.  Breast cancer (BRCA) gene test is recommended for women who have family members with BRCA-related cancers. Results of the assessment will determine the need for genetic counseling and BRCA1 and for BRCA2 testing. BRCA-related cancers include these types: ? Breast. This occurs in males or females. ? Ovarian. ? Tubal. This may also be called fallopian tube cancer. ? Cancer of the abdominal or pelvic lining (peritoneal cancer). ? Prostate. ? Pancreatic.  Cervical, Uterine, and Ovarian Cancer Your health care provider may recommend that you be screened regularly for cancer of the pelvic organs. These include your ovaries, uterus, and vagina. This screening involves a pelvic exam, which includes checking for microscopic changes to the surface of your cervix (Pap test).  For women ages 21-65, health care providers may recommend a pelvic exam and a Pap test every three years. For women ages 32-65, they may recommend the Pap test and pelvic exam, combined with testing for human papilloma virus (HPV), every five years. Some types of HPV increase your risk of cervical cancer. Testing for HPV may also be done on women of any age who have unclear Pap test results.  Other health care providers may not recommend any screening for nonpregnant women who are considered low risk for pelvic cancer and have no symptoms. Ask your health care provider if a screening pelvic exam is right for you.  If you have had past  treatment for cervical cancer or a condition that could lead to cancer, you need Pap tests and screening for cancer for at least 20 years after your treatment. If Pap tests have been discontinued for you, your risk factors (such as having a new sexual partner) need to be reassessed to determine if you should start having screenings again. Some women have medical problems that increase the chance of getting cervical cancer. In these cases, your health care provider may recommend that you have screening and Pap tests more often.  If you have a family history of uterine cancer or ovarian cancer, talk with your health care provider about genetic screening.  If you have vaginal bleeding after reaching menopause, tell your health care provider.  There are currently no reliable tests available to screen for ovarian cancer.  Lung Cancer Lung cancer screening is recommended for adults 16-32 years old who are at high risk for lung cancer because of a history of smoking. A yearly low-dose CT scan of the lungs is recommended if you:  Currently smoke.  Have a history of at least 30 pack-years of smoking and you currently smoke or have quit within the past 15 years. A pack-year is smoking an average of one pack of cigarettes per day for one year.  Yearly screening should:  Continue until it has been 15  years since you quit.  Stop if you develop a health problem that would prevent you from having lung cancer treatment.  Colorectal Cancer  This type of cancer can be detected and can often be prevented.  Routine colorectal cancer screening usually begins at age 63 and continues through age 66.  If you have risk factors for colon cancer, your health care provider may recommend that you be screened at an earlier age.  If you have a family history of colorectal cancer, talk with your health care provider about genetic screening.  Your health care provider may also recommend using home test kits to check  for hidden blood in your stool.  A small camera at the end of a tube can be used to examine your colon directly (sigmoidoscopy or colonoscopy). This is done to check for the earliest forms of colorectal cancer.  Direct examination of the colon should be repeated every 5-10 years until age 15. However, if early forms of precancerous polyps or small growths are found or if you have a family history or genetic risk for colorectal cancer, you may need to be screened more often.  Skin Cancer  Check your skin from head to toe regularly.  Monitor any moles. Be sure to tell your health care provider: ? About any new moles or changes in moles, especially if there is a change in a mole's shape or color. ? If you have a mole that is larger than the size of a pencil eraser.  If any of your family members has a history of skin cancer, especially at a young age, talk with your health care provider about genetic screening.  Always use sunscreen. Apply sunscreen liberally and repeatedly throughout the day.  Whenever you are outside, protect yourself by wearing long sleeves, pants, a wide-brimmed hat, and sunglasses.  What should I know about osteoporosis? Osteoporosis is a condition in which bone destruction happens more quickly than new bone creation. After menopause, you may be at an increased risk for osteoporosis. To help prevent osteoporosis or the bone fractures that can happen because of osteoporosis, the following is recommended:  If you are 29-87 years old, get at least 1,000 mg of calcium and at least 600 mg of vitamin D per day.  If you are older than age 33 but younger than age 72, get at least 1,200 mg of calcium and at least 600 mg of vitamin D per day.  If you are older than age 32, get at least 1,200 mg of calcium and at least 800 mg of vitamin D per day.  Smoking and excessive alcohol intake increase the risk of osteoporosis. Eat foods that are rich in calcium and vitamin D, and do  weight-bearing exercises several times each week as directed by your health care provider. What should I know about how menopause affects my mental health? Depression may occur at any age, but it is more common as you become older. Common symptoms of depression include:  Low or sad mood.  Changes in sleep patterns.  Changes in appetite or eating patterns.  Feeling an overall lack of motivation or enjoyment of activities that you previously enjoyed.  Frequent crying spells.  Talk with your health care provider if you think that you are experiencing depression. What should I know about immunizations? It is important that you get and maintain your immunizations. These include:  Tetanus, diphtheria, and pertussis (Tdap) booster vaccine.  Influenza every year before the flu season begins.  Pneumonia vaccine.  Shingles vaccine.  Your health care provider may also recommend other immunizations. This information is not intended to replace advice given to you by your health care provider. Make sure you discuss any questions you have with your health care provider. Document Released: 10/17/2005 Document Revised: 03/14/2016 Document Reviewed: 05/29/2015 Elsevier Interactive Patient Education  2018 [employer].

## 2017-04-08 DIAGNOSIS — M7062 Trochanteric bursitis, left hip: Secondary | ICD-10-CM | POA: Diagnosis not present

## 2017-04-08 DIAGNOSIS — M25552 Pain in left hip: Secondary | ICD-10-CM | POA: Diagnosis not present

## 2017-04-30 DIAGNOSIS — E782 Mixed hyperlipidemia: Secondary | ICD-10-CM | POA: Diagnosis not present

## 2017-04-30 DIAGNOSIS — Z0000 Encounter for general adult medical examination without abnormal findings: Secondary | ICD-10-CM | POA: Diagnosis not present

## 2017-04-30 DIAGNOSIS — M7062 Trochanteric bursitis, left hip: Secondary | ICD-10-CM | POA: Diagnosis not present

## 2017-05-07 DIAGNOSIS — Z0000 Encounter for general adult medical examination without abnormal findings: Secondary | ICD-10-CM | POA: Diagnosis not present

## 2017-05-07 DIAGNOSIS — Z79899 Other long term (current) drug therapy: Secondary | ICD-10-CM | POA: Diagnosis not present

## 2017-05-07 DIAGNOSIS — E782 Mixed hyperlipidemia: Secondary | ICD-10-CM | POA: Diagnosis not present

## 2017-07-17 ENCOUNTER — Other Ambulatory Visit: Payer: Self-pay | Admitting: Obstetrics and Gynecology

## 2017-07-17 DIAGNOSIS — Z1211 Encounter for screening for malignant neoplasm of colon: Secondary | ICD-10-CM | POA: Diagnosis not present

## 2017-07-21 LAB — FECAL OCCULT BLOOD, IMMUNOCHEMICAL: FECAL OCCULT BLD: NEGATIVE

## 2017-07-21 LAB — SPECIMEN STATUS REPORT

## 2017-08-03 DIAGNOSIS — D2271 Melanocytic nevi of right lower limb, including hip: Secondary | ICD-10-CM | POA: Diagnosis not present

## 2017-08-03 DIAGNOSIS — D2262 Melanocytic nevi of left upper limb, including shoulder: Secondary | ICD-10-CM | POA: Diagnosis not present

## 2017-08-03 DIAGNOSIS — D225 Melanocytic nevi of trunk: Secondary | ICD-10-CM | POA: Diagnosis not present

## 2017-08-03 DIAGNOSIS — Z85828 Personal history of other malignant neoplasm of skin: Secondary | ICD-10-CM | POA: Diagnosis not present

## 2017-08-05 ENCOUNTER — Other Ambulatory Visit: Payer: Self-pay | Admitting: Obstetrics and Gynecology

## 2017-08-05 DIAGNOSIS — Z1231 Encounter for screening mammogram for malignant neoplasm of breast: Secondary | ICD-10-CM

## 2017-08-14 DIAGNOSIS — H169 Unspecified keratitis: Secondary | ICD-10-CM | POA: Diagnosis not present

## 2017-08-27 DIAGNOSIS — M7541 Impingement syndrome of right shoulder: Secondary | ICD-10-CM | POA: Diagnosis not present

## 2017-08-27 DIAGNOSIS — M25511 Pain in right shoulder: Secondary | ICD-10-CM | POA: Diagnosis not present

## 2017-08-28 ENCOUNTER — Other Ambulatory Visit: Payer: Self-pay | Admitting: Physician Assistant

## 2017-08-28 DIAGNOSIS — M7541 Impingement syndrome of right shoulder: Secondary | ICD-10-CM

## 2017-09-09 ENCOUNTER — Ambulatory Visit
Admission: RE | Admit: 2017-09-09 | Discharge: 2017-09-09 | Disposition: A | Discharge status: Home / Self Care | Payer: PPO | Source: Ambulatory Visit | Attending: Physician Assistant | Admitting: Physician Assistant

## 2017-09-09 DIAGNOSIS — M7541 Impingement syndrome of right shoulder: Secondary | ICD-10-CM | POA: Diagnosis not present

## 2017-09-09 DIAGNOSIS — M75121 Complete rotator cuff tear or rupture of right shoulder, not specified as traumatic: Secondary | ICD-10-CM | POA: Diagnosis not present

## 2017-09-09 DIAGNOSIS — M25511 Pain in right shoulder: Secondary | ICD-10-CM | POA: Insufficient documentation

## 2017-10-02 DIAGNOSIS — Z96641 Presence of right artificial hip joint: Secondary | ICD-10-CM | POA: Diagnosis not present

## 2017-10-02 DIAGNOSIS — M75121 Complete rotator cuff tear or rupture of right shoulder, not specified as traumatic: Secondary | ICD-10-CM | POA: Diagnosis not present

## 2017-10-05 DIAGNOSIS — M7581 Other shoulder lesions, right shoulder: Secondary | ICD-10-CM | POA: Diagnosis not present

## 2017-10-05 DIAGNOSIS — M75121 Complete rotator cuff tear or rupture of right shoulder, not specified as traumatic: Secondary | ICD-10-CM | POA: Diagnosis not present

## 2017-10-16 ENCOUNTER — Other Ambulatory Visit: Payer: Self-pay | Admitting: Obstetrics and Gynecology

## 2017-10-16 ENCOUNTER — Ambulatory Visit
Admission: RE | Admit: 2017-10-16 | Discharge: 2017-10-16 | Disposition: A | Discharge status: Home / Self Care | Payer: PPO | Source: Ambulatory Visit | Attending: Obstetrics and Gynecology | Admitting: Obstetrics and Gynecology

## 2017-10-16 DIAGNOSIS — Z1231 Encounter for screening mammogram for malignant neoplasm of breast: Secondary | ICD-10-CM | POA: Diagnosis not present

## 2017-10-16 DIAGNOSIS — R928 Other abnormal and inconclusive findings on diagnostic imaging of breast: Secondary | ICD-10-CM

## 2017-10-21 ENCOUNTER — Other Ambulatory Visit: Payer: Self-pay

## 2017-10-23 ENCOUNTER — Ambulatory Visit
Admission: RE | Admit: 2017-10-23 | Discharge: 2017-10-23 | Disposition: A | Discharge status: Home / Self Care | Payer: PPO | Source: Ambulatory Visit | Attending: Obstetrics and Gynecology | Admitting: Obstetrics and Gynecology

## 2017-10-23 DIAGNOSIS — R928 Other abnormal and inconclusive findings on diagnostic imaging of breast: Secondary | ICD-10-CM | POA: Diagnosis not present

## 2017-10-23 DIAGNOSIS — R922 Inconclusive mammogram: Secondary | ICD-10-CM | POA: Diagnosis not present

## 2017-10-28 ENCOUNTER — Encounter
Admission: RE | Disposition: A | Discharge status: Home / Self Care | Payer: Self-pay | Source: Ambulatory Visit | Attending: Surgery

## 2017-10-28 ENCOUNTER — Ambulatory Visit
Admission: RE | Admit: 2017-10-28 | Discharge: 2017-10-28 | Disposition: A | Discharge status: Home / Self Care | Payer: PPO | Source: Ambulatory Visit | Attending: Surgery | Admitting: Surgery

## 2017-10-28 ENCOUNTER — Ambulatory Visit: Payer: PPO | Admitting: Anesthesiology

## 2017-10-28 DIAGNOSIS — M75101 Unspecified rotator cuff tear or rupture of right shoulder, not specified as traumatic: Secondary | ICD-10-CM | POA: Diagnosis not present

## 2017-10-28 DIAGNOSIS — I10 Essential (primary) hypertension: Secondary | ICD-10-CM | POA: Insufficient documentation

## 2017-10-28 DIAGNOSIS — M7581 Other shoulder lesions, right shoulder: Secondary | ICD-10-CM | POA: Diagnosis not present

## 2017-10-28 DIAGNOSIS — Z79899 Other long term (current) drug therapy: Secondary | ICD-10-CM | POA: Insufficient documentation

## 2017-10-28 DIAGNOSIS — K219 Gastro-esophageal reflux disease without esophagitis: Secondary | ICD-10-CM | POA: Insufficient documentation

## 2017-10-28 DIAGNOSIS — M75121 Complete rotator cuff tear or rupture of right shoulder, not specified as traumatic: Secondary | ICD-10-CM | POA: Diagnosis not present

## 2017-10-28 DIAGNOSIS — M7521 Bicipital tendinitis, right shoulder: Secondary | ICD-10-CM | POA: Insufficient documentation

## 2017-10-28 DIAGNOSIS — M25511 Pain in right shoulder: Secondary | ICD-10-CM | POA: Diagnosis not present

## 2017-10-28 DIAGNOSIS — G8918 Other acute postprocedural pain: Secondary | ICD-10-CM | POA: Diagnosis not present

## 2017-10-28 DIAGNOSIS — M7541 Impingement syndrome of right shoulder: Secondary | ICD-10-CM | POA: Insufficient documentation

## 2017-10-28 DIAGNOSIS — M24111 Other articular cartilage disorders, right shoulder: Secondary | ICD-10-CM | POA: Diagnosis not present

## 2017-10-28 HISTORY — PX: SHOULDER ARTHROSCOPY: SHX128

## 2017-10-28 HISTORY — DX: Pain in right shoulder: M25.511

## 2017-10-28 HISTORY — DX: Cardiac murmur, unspecified: R01.1

## 2017-10-28 HISTORY — DX: Gastro-esophageal reflux disease without esophagitis: K21.9

## 2017-10-28 SURGERY — ARTHROSCOPY, SHOULDER
Anesthesia: Regional | Laterality: Right

## 2017-10-28 MED ORDER — LACTATED RINGERS IV SOLN
INTRAVENOUS | Status: DC
  Administered 2017-10-28 (×2): via INTRAVENOUS

## 2017-10-28 MED ORDER — ONDANSETRON HCL 4 MG PO TABS: 4.0000 mg | ORAL_TABLET | Freq: Four times a day (QID) | ORAL | Status: DC | PRN

## 2017-10-28 MED ORDER — LIDOCAINE HCL (CARDIAC) 20 MG/ML IV SOLN
INTRAVENOUS | Status: DC | PRN
  Administered 2017-10-28: 30 mg via INTRATRACHEAL

## 2017-10-28 MED ORDER — DEXAMETHASONE SODIUM PHOSPHATE 4 MG/ML IJ SOLN
INTRAMUSCULAR | Status: DC | PRN
  Administered 2017-10-28: 4 mg via INTRAVENOUS

## 2017-10-28 MED ORDER — LIDOCAINE-EPINEPHRINE 1 %-1:100000 IJ SOLN
INTRAMUSCULAR | Status: DC | PRN
  Administered 2017-10-28: 30 mL

## 2017-10-28 MED ORDER — ACETAMINOPHEN 10 MG/ML IV SOLN: 1000.0000 mg | Freq: Once | INTRAVENOUS | Status: DC | PRN

## 2017-10-28 MED ORDER — OXYCODONE HCL 5 MG PO TABS: 5.0000 mg | ORAL_TABLET | ORAL | 0 refills | Status: DC | PRN

## 2017-10-28 MED ORDER — OXYCODONE HCL 5 MG PO TABS: 5.0000 mg | ORAL_TABLET | Freq: Once | ORAL | Status: DC | PRN

## 2017-10-28 MED ORDER — METOCLOPRAMIDE HCL 5 MG/ML IJ SOLN: 5.0000 mg | Freq: Three times a day (TID) | INTRAMUSCULAR | Status: DC | PRN

## 2017-10-28 MED ORDER — OXYCODONE HCL 5 MG PO TABS: 5.0000 mg | ORAL_TABLET | ORAL | Status: DC | PRN

## 2017-10-28 MED ORDER — METOCLOPRAMIDE HCL 5 MG PO TABS: 5.0000 mg | ORAL_TABLET | Freq: Three times a day (TID) | ORAL | Status: DC | PRN

## 2017-10-28 MED ORDER — GLYCOPYRROLATE 0.2 MG/ML IJ SOLN
INTRAMUSCULAR | Status: DC | PRN
  Administered 2017-10-28: 0.1 mg via INTRAVENOUS

## 2017-10-28 MED ORDER — CEFAZOLIN SODIUM-DEXTROSE 2-4 GM/100ML-% IV SOLN
2.0000 g | Freq: Once | INTRAVENOUS | Status: AC
  Administered 2017-10-28: 2 g via INTRAVENOUS

## 2017-10-28 MED ORDER — LACTATED RINGERS IV SOLN: INTRAVENOUS | Status: DC

## 2017-10-28 MED ORDER — FENTANYL CITRATE (PF) 100 MCG/2ML IJ SOLN
25.0000 ug | INTRAMUSCULAR | Status: DC | PRN
Start: 2017-10-28 — End: 2017-10-28

## 2017-10-28 MED ORDER — OXYCODONE HCL 5 MG/5ML PO SOLN: 5.0000 mg | Freq: Once | ORAL | Status: DC | PRN

## 2017-10-28 MED ORDER — ONDANSETRON HCL 4 MG/2ML IJ SOLN
4.0000 mg | Freq: Four times a day (QID) | INTRAMUSCULAR | Status: DC | PRN
  Administered 2017-10-28: 4 mg via INTRAVENOUS

## 2017-10-28 MED ORDER — MIDAZOLAM HCL 5 MG/5ML IJ SOLN
INTRAMUSCULAR | Status: DC | PRN
  Administered 2017-10-28: 1 mg via INTRAVENOUS

## 2017-10-28 MED ORDER — ONDANSETRON HCL 4 MG/2ML IJ SOLN: 4.0000 mg | Freq: Once | INTRAMUSCULAR | Status: DC | PRN

## 2017-10-28 MED ORDER — EPHEDRINE SULFATE 50 MG/ML IJ SOLN
INTRAMUSCULAR | Status: DC | PRN
  Administered 2017-10-28: 10 mg via INTRAVENOUS

## 2017-10-28 MED ORDER — PROPOFOL 10 MG/ML IV BOLUS
INTRAVENOUS | Status: DC | PRN
  Administered 2017-10-28: 150 mg via INTRAVENOUS

## 2017-10-28 MED ORDER — FENTANYL CITRATE (PF) 100 MCG/2ML IJ SOLN
INTRAMUSCULAR | Status: DC | PRN
  Administered 2017-10-28: 50 ug via INTRAVENOUS

## 2017-10-28 MED ORDER — ROPIVACAINE HCL 5 MG/ML IJ SOLN
INTRAMUSCULAR | Status: DC | PRN
  Administered 2017-10-28: 35 mL

## 2017-10-28 MED ORDER — POTASSIUM CHLORIDE IN NACL 20-0.9 MEQ/L-% IV SOLN: INTRAVENOUS | Status: DC

## 2017-10-28 SURGICAL SUPPLY — 40 items
ANCHOR JUGGERKNOT WTAP NDL 2.9 (Anchor) ×4 IMPLANT
ANCHOR SUT QUATTRO KNTLS 4.5 (Anchor) ×4 IMPLANT
ANCHOR SUT W/ ORTHOCORD (Anchor) ×2 IMPLANT
BIT DRILL JUGRKNT W/NDL BIT2.9 (DRILL) ×1 IMPLANT
BLADE FULL RADIUS 3.5 (BLADE) ×2 IMPLANT
BUR ACROMIONIZER 4.0 (BURR) ×2 IMPLANT
CANNULA SHAVER 8MMX76MM (CANNULA) ×4 IMPLANT
CHLORAPREP W/TINT 26ML (MISCELLANEOUS) ×2 IMPLANT
COVER LIGHT HANDLE UNIVERSAL (MISCELLANEOUS) ×4 IMPLANT
COVER MAYO STAND STRL (DRAPES) ×2 IMPLANT
DRAPE IMP U-DRAPE 54X76 (DRAPES) ×4 IMPLANT
DRILL JUGGERKNOT W/NDL BIT 2.9 (DRILL) ×2
ELECT REM PT RETURN 9FT ADLT (ELECTROSURGICAL) ×2
ELECTRODE REM PT RTRN 9FT ADLT (ELECTROSURGICAL) ×1 IMPLANT
GAUZE PETRO XEROFOAM 1X8 (MISCELLANEOUS) ×2 IMPLANT
GAUZE SPONGE 4X4 12PLY STRL (GAUZE/BANDAGES/DRESSINGS) ×2 IMPLANT
GLOVE BIO SURGEON STRL SZ8 (GLOVE) ×4 IMPLANT
GLOVE INDICATOR 8.0 STRL GRN (GLOVE) ×2 IMPLANT
GOWN STRL REUS W/ TWL LRG LVL3 (GOWN DISPOSABLE) ×1 IMPLANT
GOWN STRL REUS W/ TWL XL LVL3 (GOWN DISPOSABLE) ×1 IMPLANT
GOWN STRL REUS W/TWL LRG LVL3 (GOWN DISPOSABLE) ×1
GOWN STRL REUS W/TWL XL LVL3 (GOWN DISPOSABLE) ×1
GRASPER SUT 15 45D LOW PRO (SUTURE) ×2 IMPLANT
IV LACTATED RINGER IRRG 3000ML (IV SOLUTION) ×2
IV LR IRRIG 3000ML ARTHROMATIC (IV SOLUTION) ×2 IMPLANT
MANIFOLD 4PT FOR NEPTUNE1 (MISCELLANEOUS) ×2 IMPLANT
MAT BLUE FLOOR 46X72 FLO (MISCELLANEOUS) ×2 IMPLANT
NEEDLE HYPO 21X1.5 SAFETY (NEEDLE) ×2 IMPLANT
NEEDLE REVERSE CUT 1/2 CRC (NEEDLE) IMPLANT
PACK ARTHROSCOPY SHOULDER (MISCELLANEOUS) ×2 IMPLANT
SLING ULTRA II M (MISCELLANEOUS) ×2 IMPLANT
STAPLER SKIN PROX 35W (STAPLE) ×2 IMPLANT
STRAP BODY AND KNEE 60X3 (MISCELLANEOUS) ×4 IMPLANT
SUT ETHIBOND 0 MO6 C/R (SUTURE) ×2 IMPLANT
SUT VIC AB 2-0 CT1 27 (SUTURE) ×2
SUT VIC AB 2-0 CT1 TAPERPNT 27 (SUTURE) ×2 IMPLANT
TAPE MICROFOAM 4IN (TAPE) ×2 IMPLANT
TUBING ARTHRO INFLOW-ONLY STRL (TUBING) ×2 IMPLANT
TUBING CONNECTING 10 (TUBING) ×2 IMPLANT
WAND HAND CNTRL MULTIVAC 90 (MISCELLANEOUS) ×2 IMPLANT

## 2017-10-28 NOTE — Transfer of Care (Signed)
Immediate Anesthesia Transfer of Care Note  Patient: Christina Foley  Procedure(s) Performed: ARTHROSCOPY SHOULDER EITH DEBRIDEMENT DECOMPRESSIOIN AND REAPIR OF ROTATOR CUFF REPAIR (Right )  Patient Location: PACU  Anesthesia Type: General, Regional  Level of Consciousness: awake, alert  and patient cooperative  Airway and Oxygen Therapy: Patient Spontanous Breathing and Patient connected to supplemental oxygen  Post-op Assessment: Post-op Vital signs reviewed, Patient's Cardiovascular Status Stable, Respiratory Function Stable, Patent Airway and No signs of Nausea or vomiting  Post-op Vital Signs: Reviewed and stable  Complications: No apparent anesthesia complications

## 2017-10-28 NOTE — Anesthesia Preprocedure Evaluation (Signed)
Anesthesia Evaluation  Patient identified by MRN, date of birth, ID band Patient awake    Reviewed: Allergy & Precautions, NPO status , Patient's Chart, lab work & pertinent test results  History of Anesthesia Complications Negative for: history of anesthetic complications  Airway Mallampati: I  TM Distance: >3 FB Neck ROM: Full    Dental  (+)    Pulmonary neg pulmonary ROS,    Pulmonary exam normal breath sounds clear to auscultation       Cardiovascular Exercise Tolerance: Good hypertension, + Valvular Problems/Murmurs (murmur)  Rhythm:Regular Rate:Normal + Systolic murmurs    Neuro/Psych negative neurological ROS     GI/Hepatic GERD  ,  Endo/Other  negative endocrine ROS  Renal/GU negative Renal ROS     Musculoskeletal   Abdominal   Peds  Hematology negative hematology ROS (+)   Anesthesia Other Findings   Reproductive/Obstetrics                             Anesthesia Physical Anesthesia Plan  ASA: II  Anesthesia Plan: General and Regional   Post-op Pain Management:  Regional for Post-op pain and GA combined w/ Regional for post-op pain   Induction: Intravenous  PONV Risk Score and Plan: 2 and Ondansetron and Treatment may vary due to age or medical condition  Airway Management Planned: LMA  Additional Equipment:   Intra-op Plan:   Post-operative Plan: Extubation in OR  Informed Consent: I have reviewed the patients History and Physical, chart, labs and discussed the procedure including the risks, benefits and alternatives for the proposed anesthesia with the patient or authorized representative who has indicated his/her understanding and acceptance.     Plan Discussed with: CRNA  Anesthesia Plan Comments:         Anesthesia Quick Evaluation

## 2017-10-28 NOTE — Anesthesia Postprocedure Evaluation (Signed)
Anesthesia Post Note  Patient: Christina Foley  Procedure(s) Performed: ARTHROSCOPY SHOULDER EITH DEBRIDEMENT DECOMPRESSIOIN AND REAPIR OF ROTATOR CUFF REPAIR (Right )  Patient location during evaluation: PACU Anesthesia Type: Regional Level of consciousness: awake and alert, oriented and patient cooperative Pain management: pain level controlled Vital Signs Assessment: post-procedure vital signs reviewed and stable Respiratory status: spontaneous breathing, nonlabored ventilation and respiratory function stable Cardiovascular status: blood pressure returned to baseline and stable Postop Assessment: adequate PO intake Anesthetic complications: no    Darrin Nipper

## 2017-10-28 NOTE — Anesthesia Procedure Notes (Addendum)
Procedure Name: LMA Insertion Date/Time: 10/28/2017 12:18 PM Performed by: Cameron Ali, CRNA Pre-anesthesia Checklist: Patient identified, Emergency Drugs available, Suction available, Timeout performed and Patient being monitored Patient Re-evaluated:Patient Re-evaluated prior to induction Oxygen Delivery Method: Circle system utilized Preoxygenation: Pre-oxygenation with 100% oxygen Induction Type: IV induction LMA: LMA inserted LMA Size: 3.0 Number of attempts: 1 Placement Confirmation: positive ETCO2 and breath sounds checked- equal and bilateral Tube secured with: Tape Dental Injury: Teeth and Oropharynx as per pre-operative assessment

## 2017-10-28 NOTE — H&P (Signed)
Paper H&P to be scanned into permanent record. H&P reviewed and patient re-examined. No changes. 

## 2017-10-28 NOTE — Discharge Instructions (Signed)
General Anesthesia, Adult, Care After These instructions provide you with information about caring for yourself after your procedure. Your health care provider may also give you more specific instructions. Your treatment has been planned according to current medical practices, but problems sometimes occur. Call your health care provider if you have any problems or questions after your procedure. What can I expect after the procedure? After the procedure, it is common to have:  Vomiting.  A sore throat.  Mental slowness.  It is common to feel:  Nauseous.  Cold or shivery.  Sleepy.  Tired.  Sore or achy, even in parts of your body where you did not have surgery.  Follow these instructions at home: For at least 24 hours after the procedure:  Do not: ? Participate in activities where you could fall or become injured. ? Drive. ? Use heavy machinery. ? Drink alcohol. ? Take sleeping pills or medicines that cause drowsiness. ? Make important decisions or sign legal documents. ? Take care of children on your own.  Rest. Eating and drinking  If you vomit, drink water, juice, or soup when you can drink without vomiting.  Drink enough fluid to keep your urine clear or pale yellow.  Make sure you have little or no nausea before eating solid foods.  Follow the diet recommended by your health care provider. General instructions  Have a responsible adult stay with you until you are awake and alert.  Return to your normal activities as told by your health care provider. Ask your health care provider what activities are safe for you.  Take over-the-counter and prescription medicines only as told by your health care provider.  If you smoke, do not smoke without supervision.  Keep all follow-up visits as told by your health care provider. This is important. Contact a health care provider if:  You continue to have nausea or vomiting at home, and medicines are not helpful.  You  cannot drink fluids or start eating again.  You cannot urinate after 8-12 hours.  You develop a skin rash.  You have fever.  You have increasing redness at the site of your procedure. Get help right away if:  You have difficulty breathing.  You have chest pain.  You have unexpected bleeding.  You feel that you are having a life-threatening or urgent problem. This information is not intended to replace advice given to you by your health care provider. Make sure you discuss any questions you have with your health care provider. Document Released: 12/01/2000 Document Revised: 01/28/2016 Document Reviewed: 08/09/2015 Elsevier Interactive Patient Education  2018 [employer].   Keep dressing dry and intact.  May shower after dressing changed on post-op day #4 (Sunday).  Cover staples with Band-Aids after drying off. Apply ice frequently to shoulder. Take ibuprofen 600-800 mg TID with meals for 7-10 days, then as necessary. Take oxycodone as prescribed when needed.  May supplement with ES Tylenol if necessary. Keep shoulder immobilizer on at all times except may remove for bathing purposes. Follow-up in 10-14 days or as scheduled.

## 2017-10-28 NOTE — Progress Notes (Signed)
Assisted Darrin Nipper, ANMD with right, ultrasound guided, supraclavicular block. Side rails up, monitors on throughout procedure. See vital signs in flow sheet. Tolerated Procedure well.

## 2017-10-28 NOTE — Op Note (Signed)
10/28/2017  2:47 PM  Patient:   Christina Foley  Pre-Op Diagnosis:   Impingement/tendinopathy with rotator cuff tear, right shoulder.  Post-Op Diagnosis: Impingement/tendinopathy with rotator cuff tear, labral fraying, and biceps tendinopathy, right shoulder.  Procedure: Limited arthroscopic debridement, arthroscopic subscapularis tendon repair, arthroscopic subacromial decompression, mini-open rotator cuff repair, and mini-open biceps tenodesis, right shoulder.  Anesthesia: General LMA with interscalene block placed preoperatively by the anesthesiologist.  Surgeon:   Pascal Lux, MD  Assistant:   None  Findings: As above.  There was mild fraying of the labrum anteriorly and posterior superiorly without frank detachment from the glenoid.  There was a tongue-type full-thickness tear involving the entire supraspinatus insertion, as well as a partial-thickness tear involving the superior articular insertional fibers of the subscapularis tendon.  The articular surfaces of the glenoid and humerus both were in excellent condition.  Complications: None  Fluids:   1100 cc  Estimated blood loss: 5 cc  Tourniquet time: None  Drains: None  Closure: Staples   Brief clinical note: The patient is a 72 year old female with a history of right shoulder pain. The patient's symptoms have progressed despite medications, activity modification, etc. The patient's history and examination are consistent with impingement/tendinopathy with a rotator cuff tear. These findings were confirmed by MRI scan. The patient presents at this time for definitive management of these shoulder symptoms.  Procedure: The patient underwent placement of an interscalene block by the anesthesiologist in the preoperative holding area before being brought into the operating room and lain in the supine position. The patient then underwent general laryngeal mask anesthesia before being  repositioned in the beach chair position using the beach chair positioner. The right shoulder and upper extremity were prepped with ChloraPrep solution before being draped sterilely. Preoperative antibiotics were administered. A timeout was performed to confirm the proper surgical site before the expected portal sites and incision site were injected with 0.5% Sensorcaine with epinephrine. A posterior portal was created and the glenohumeral joint thoroughly inspected with the findings as described above. An anterior portal was created using an outside-in technique. The labrum and rotator cuff were further probed, again confirming the above-noted findings. Areas of labral fraying were debrided back to stable margins using the full-radius resector, as were the frayed margins of the subscapularis and supraspinatus tendon tears. The ArthroCare wand was inserted and used to obtain hemostasis as well as to "anneal" the labrum superiorly and anteriorly.   Utilizing a separate superolateral portal site created via an outside-in technique, the superior portion of the subscapularis tendon was repaired using a single Mitek BioKnotless anchor placed to the anterior portal. The repair was deemed stable both to probing as well as to external rotatory stressing. The instruments were removed from the joint after suctioning the excess fluid.  The camera was repositioned through the posterior portal into the subacromial space. A separate lateral portal was created using an outside-in technique. The 3.5 mm full-radius resector was introduced and used to perform a subtotal bursectomy. The ArthroCare wand was then inserted and used to remove the periosteal tissue off the undersurface of the anterior third of the acromion as well as to recess the coracoacromial ligament from its attachment along the anterior and lateral margins of the acromion. The 4.0 mm acromionizing bur was introduced and used to complete the decompression by  removing the undersurface of the anterior third of the acromion. The full radius resector was reintroduced to remove any residual bony debris before the ArthroCare wand was  reintroduced to obtain hemostasis. The instruments were then removed from the subacromial space after suctioning the excess fluid.  An approximately 4-5 cm incision was made over the anterolateral aspect of the shoulder beginning at the anterolateral corner of the acromion and extending distally in line with the bicipital groove, incorporating the superolateral portal site. This incision was carried down through the subcutaneous tissues to expose the deltoid fascia. The raphae between the anterior and middle thirds was identified and this plane developed to provide access into the subacromial space. Additional bursal tissues were debrided sharply using Metzenbaum scissors. The rotator cuff tear was readily identified. The margins were debrided sharply with a #15 blade and the exposed greater tuberosity roughened with a rongeur. The tear was repaired using two Biomet 2.9 mm JuggerKnot anchors. These sutures were then brought back laterally and secured using two [place] QuatroLink anchors to create a two-layer closure. In addition, several side-to-side sutures were placed anteriorly and posteriorly to complete the repair. An apparent watertight closure was obtained.  The bicipital groove was identified by palpation and opened for 1-1.5 cm. The biceps tendon stump was retrieved through this defect. The floor of the bicipital groove was roughened with a curet before another Biomet 2.9 mm JuggerKnot anchor was inserted. Both sets of sutures were passed through the biceps tendon and tied securely to effect the tenodesis. The bicipital sheath was reapproximated using two #0 Ethibond interrupted sutures, incorporating the biceps tendon to further reinforce the tenodesis.  The wound was copiously irrigated with sterile saline solution before the  deltoid raphae was reapproximated using 2-0 Vicryl interrupted sutures. The subcutaneous tissues were closed in two layers using 2-0 Vicryl interrupted sutures before the skin was closed using staples. The portal sites also were closed using staples. A sterile bulky dressing was applied to the shoulder before the arm was placed into a shoulder immobilizer. The patient was then awakened, extubated, and returned to the recovery room in satisfactory condition after tolerating the procedure well.

## 2017-10-28 NOTE — Anesthesia Procedure Notes (Signed)
Anesthesia Regional Block: Interscalene brachial plexus block   Pre-Anesthetic Checklist: ,, timeout performed, Correct Patient, Correct Site, Correct Laterality, Correct Procedure, Correct Position, site marked, Risks and benefits discussed,  Surgical consent,  Pre-op evaluation,  At surgeon's request and post-op pain management  Laterality: Right  Prep: chloraprep       Needles:  Injection technique: Single-shot  Needle Type: Stimiplex     Needle Length: 4cm  Needle Gauge: 21     Additional Needles:   Procedures:,,,, ultrasound used (permanent image in chart),,,,  Narrative:  Start time: 10/28/2017 11:40 AM End time: 10/28/2017 11:47 AM Injection made incrementally with aspirations every 5 mL.  Performed by: Personally  Anesthesiologist: Darrin Nipper, MD  Additional Notes: Functioning IV was confirmed and monitors applied. Ultrasound guidance: relevant anatomy identified, needle position confirmed, local anesthetic spread visualized around nerve(s)., vascular puncture avoided.  Image printed for medical record.  Negative aspiration and no paresthesias; incremental administration of local anesthetic. The patient tolerated the procedure well. Vitals signes recorded in RN notes.

## 2017-10-29 ENCOUNTER — Encounter: Payer: Self-pay | Admitting: Surgery

## 2017-11-06 DIAGNOSIS — E782 Mixed hyperlipidemia: Secondary | ICD-10-CM | POA: Diagnosis not present

## 2017-11-09 DIAGNOSIS — Z9889 Other specified postprocedural states: Secondary | ICD-10-CM | POA: Diagnosis not present

## 2017-11-19 DIAGNOSIS — Z9889 Other specified postprocedural states: Secondary | ICD-10-CM | POA: Diagnosis not present

## 2017-11-26 DIAGNOSIS — Z9889 Other specified postprocedural states: Secondary | ICD-10-CM | POA: Diagnosis not present

## 2017-12-03 DIAGNOSIS — Z9889 Other specified postprocedural states: Secondary | ICD-10-CM | POA: Diagnosis not present

## 2017-12-07 DIAGNOSIS — Z9889 Other specified postprocedural states: Secondary | ICD-10-CM | POA: Diagnosis not present

## 2017-12-10 DIAGNOSIS — Z9889 Other specified postprocedural states: Secondary | ICD-10-CM | POA: Diagnosis not present

## 2017-12-14 DIAGNOSIS — Z9889 Other specified postprocedural states: Secondary | ICD-10-CM | POA: Diagnosis not present

## 2017-12-17 DIAGNOSIS — Z9889 Other specified postprocedural states: Secondary | ICD-10-CM | POA: Diagnosis not present

## 2017-12-29 DIAGNOSIS — Z9889 Other specified postprocedural states: Secondary | ICD-10-CM | POA: Diagnosis not present

## 2017-12-31 DIAGNOSIS — Z9889 Other specified postprocedural states: Secondary | ICD-10-CM | POA: Diagnosis not present

## 2018-01-04 DIAGNOSIS — M4726 Other spondylosis with radiculopathy, lumbar region: Secondary | ICD-10-CM | POA: Diagnosis not present

## 2018-01-04 DIAGNOSIS — M5416 Radiculopathy, lumbar region: Secondary | ICD-10-CM | POA: Diagnosis not present

## 2018-01-04 DIAGNOSIS — M7062 Trochanteric bursitis, left hip: Secondary | ICD-10-CM | POA: Diagnosis not present

## 2018-01-05 DIAGNOSIS — Z9889 Other specified postprocedural states: Secondary | ICD-10-CM | POA: Diagnosis not present

## 2018-01-06 DIAGNOSIS — R12 Heartburn: Secondary | ICD-10-CM | POA: Diagnosis not present

## 2018-01-06 DIAGNOSIS — Z1211 Encounter for screening for malignant neoplasm of colon: Secondary | ICD-10-CM | POA: Diagnosis not present

## 2018-01-08 DIAGNOSIS — Z9889 Other specified postprocedural states: Secondary | ICD-10-CM | POA: Diagnosis not present

## 2018-01-12 DIAGNOSIS — Z9889 Other specified postprocedural states: Secondary | ICD-10-CM | POA: Diagnosis not present

## 2018-01-14 DIAGNOSIS — Z9889 Other specified postprocedural states: Secondary | ICD-10-CM | POA: Diagnosis not present

## 2018-01-19 DIAGNOSIS — Z9889 Other specified postprocedural states: Secondary | ICD-10-CM | POA: Diagnosis not present

## 2018-01-20 DIAGNOSIS — R12 Heartburn: Secondary | ICD-10-CM | POA: Diagnosis not present

## 2018-01-21 DIAGNOSIS — Z9889 Other specified postprocedural states: Secondary | ICD-10-CM | POA: Diagnosis not present

## 2018-01-26 DIAGNOSIS — Z9889 Other specified postprocedural states: Secondary | ICD-10-CM | POA: Diagnosis not present

## 2018-01-28 DIAGNOSIS — Z9889 Other specified postprocedural states: Secondary | ICD-10-CM | POA: Diagnosis not present

## 2018-02-02 DIAGNOSIS — Z9889 Other specified postprocedural states: Secondary | ICD-10-CM | POA: Diagnosis not present

## 2018-02-16 DIAGNOSIS — Z9889 Other specified postprocedural states: Secondary | ICD-10-CM | POA: Diagnosis not present

## 2018-02-26 DIAGNOSIS — M24111 Other articular cartilage disorders, right shoulder: Secondary | ICD-10-CM | POA: Diagnosis not present

## 2018-02-26 DIAGNOSIS — M75121 Complete rotator cuff tear or rupture of right shoulder, not specified as traumatic: Secondary | ICD-10-CM | POA: Diagnosis not present

## 2018-02-26 DIAGNOSIS — M7581 Other shoulder lesions, right shoulder: Secondary | ICD-10-CM | POA: Diagnosis not present

## 2018-03-15 NOTE — Progress Notes (Signed)
GYN ANNUAL PREVENTATIVE CARE ENCOUNTER NOTE  Subjective:       Christina Foley is a 72 y.o. G52P2002 female here for a routine annual gynecologic exam.    Current complaints: None  Currently using Estrace vaginal cream once a week for atrophy.  Patient still has intermittent dryness symptoms.  Osteopenia was noted by DEXA scan last year.  She is taking calcium and vitamin D supplementation.  She is not interested in additional supplemental therapies at this time.  In the past she did use HRT prescribed previously by Dr. Davis Gourd.  Dr. Emily Filbert is monitoring her osteopenia and she will have a DEXA scan in 1 year.  Bowel and bladder function are normal.   Gynecologic History No LMP recorded. Patient is postmenopausal. Contraception: post menopausal status Last Pap: . 01/2015 neg/neg Results were: normal Last mammogram: 10/2017 birad 1Results were: normal    Obstetric History OB History  Gravida Para Term Preterm AB Living  2 2 2     2   SAB TAB Ectopic Multiple Live Births          2    # Outcome Date GA Lbr Len/2nd Weight Sex Delivery Anes PTL Lv  2 Term      Vag-Spont   LIV  1 Term      Vag-Spont   LIV    Past Medical History:  Diagnosis Date  . Back pain   . Decreased libido   . Diverticulitis   . Environmental allergies    seasonal  . GERD (gastroesophageal reflux disease)   . Heart murmur    1970s  . HPV test positive   . Hypertension   . Menopausal and postmenopausal disorder   . Menopause   . Osteoarthritis   . Osteopenia   . Postmenopausal atrophic vaginitis   . Rectocele   . Shoulder pain, right    rotator cuff tear    Past Surgical History:  Procedure Laterality Date  . BREAST CYST ASPIRATION Bilateral 2000   benign  . COLONOSCOPY    . CRYOTHERAPY    . ESOPHAGOGASTRODUODENOSCOPY    . EYE SURGERY Left    cataract   . FOOT SURGERY    . JOINT REPLACEMENT Right    total hip  . SHOULDER ARTHROSCOPY Right 10/28/2017   Procedure: ARTHROSCOPY  SHOULDER EITH DEBRIDEMENT DECOMPRESSIOIN AND REAPIR OF ROTATOR CUFF REPAIR;  Surgeon: Corky Mull, MD;  Location: [facility];  Service: Orthopedics;  Laterality: Right;  . TUBAL LIGATION      Current Outpatient Medications on File Prior to Visit  Medication Sig Dispense Refill  . acetaminophen (TYLENOL) 500 MG tablet Take 500 mg by mouth every 6 (six) hours as needed.    . ALPRAZolam (XANAX) 0.25 MG tablet Take by mouth.    . Biotin 2500 MCG CAPS Take by mouth daily.    . calcium carbonate (OS-CAL - DOSED IN MG OF ELEMENTAL CALCIUM) 1250 (500 Ca) MG tablet Take by mouth.    . Cholecalciferol (VITAMIN D3) 2000 units TABS Take by mouth daily.    . diphenoxylate-atropine (LOMOTIL) 2.5-0.025 MG tablet Take 1 tablet by mouth 4 (four) times daily as needed.     Marland Kitchen EPINEPHrine (EPIPEN IJ) Inject as directed. For yellow jacket stings    . estradiol (ESTRACE VAGINAL) 0.1 MG/GM vaginal cream Place 0.5 Applicatorfuls vaginally 2 (two) times a week. 42.5 g 6  . Lactobacillus (PROBIOTIC ACIDOPHILUS) TABS Take by mouth.    Marland Kitchen lisinopril-hydrochlorothiazide (PRINZIDE,ZESTORETIC) 20-12.5 MG  tablet Take by mouth.    . Loratadine 10 MG CAPS Take by mouth.    . metaxalone (SKELAXIN) 800 MG tablet Take 800 mg by mouth 3 (three) times daily as needed for muscle spasms.    . Omega 3-6-9 Fatty Acids (OMEGA-3 & OMEGA-6 FISH OIL PO) Take by mouth.    Marland Kitchen omeprazole (PRILOSEC) 20 MG capsule Take 20 mg by mouth daily.    Marland Kitchen oxyCODONE (ROXICODONE) 5 MG immediate release tablet Take 1-2 tablets (5-10 mg total) by mouth every 4 (four) hours as needed for severe pain. 50 tablet 0  . prednisoLONE acetate (PRED FORTE) 1 % ophthalmic suspension 1 drop 4 (four) times daily.    . ranitidine (ZANTAC) 150 MG capsule Take 150 mg by mouth 2 (two) times daily.    Marland Kitchen triamcinolone (NASACORT ALLERGY 24HR) 55 MCG/ACT AERO nasal inhaler Place 2 sprays into the nose daily.    . vitamin C (ASCORBIC ACID) 500 MG tablet Take by mouth  daily.      No current facility-administered medications on file prior to visit.     Allergies  Allergen Reactions  . Alendronate Other (See Comments)    Severe reflux    Social History   Socioeconomic History  . Marital status: Married    Spouse name: Not on file  . Number of children: Not on file  . Years of education: Not on file  . Highest education level: Not on file  Occupational History  . Not on file  Social Needs  . Financial resource strain: Not on file  . Food insecurity:    Worry: Not on file    Inability: Not on file  . Transportation needs:    Medical: Not on file    Non-medical: Not on file  Tobacco Use  . Smoking status: Never Smoker  . Smokeless tobacco: Never Used  Substance and Sexual Activity  . Alcohol use: Yes    Alcohol/week: 1.2 oz    Types: 2 Glasses of wine per week    Comment: occas  . Drug use: No  . Sexual activity: Yes    Birth control/protection: Post-menopausal  Lifestyle  . Physical activity:    Days per week: Not on file    Minutes per session: Not on file  . Stress: Not on file  Relationships  . Social connections:    Talks on phone: Not on file    Gets together: Not on file    Attends religious service: Not on file    Active member of club or organization: Not on file    Attends meetings of clubs or organizations: Not on file    Relationship status: Not on file  . Intimate partner violence:    Fear of current or ex partner: Not on file    Emotionally abused: Not on file    Physically abused: Not on file    Forced sexual activity: Not on file  Other Topics Concern  . Not on file  Social History Narrative  . Not on file    Family History  Problem Relation Age of Onset  . Breast cancer Maternal Aunt 70  . Thyroid cancer Mother   . Heart disease Father   . Hypertension Father   . Depression Paternal Grandmother   . Hypertension Brother   . Ovarian cancer Neg Hx     The following portions of the patient's  history were reviewed and updated as appropriate: allergies, current medications, past family history, past medical history,  past social history, past surgical history and problem list.  Review of Systems Review of Systems  Constitutional:       No vasomotor symptoms  Eyes: Negative.   Respiratory: Negative.   Cardiovascular: Negative.   Gastrointestinal: Negative.   Genitourinary: Negative.        Vaginal dryness persists; has not used Estrace cream due to recent  shoulder surgery  Musculoskeletal: Negative.        Status post rotator cuff surgery-right  Skin:       Vulvar irritation x1 week  Neurological: Negative.   Endo/Heme/Allergies: Negative.   Psychiatric/Behavioral: Negative.     Objective:  BP 120/67   Pulse 69   Ht 5\' 4"  (1.626 m)   Wt 140 lb 6.4 oz (63.7 kg)   BMI 24.10 kg/m   Physical Exam  Constitutional: She is oriented to person, place, and time. She appears well-developed and well-nourished.  HENT:  Head: Normocephalic and atraumatic.  Eyes: Conjunctivae and EOM are normal. No scleral icterus.  Neck: Normal range of motion. Neck supple. No thyromegaly present.  Cardiovascular: Normal rate and regular rhythm.  2/6 systolic ejection murmur  Pulmonary/Chest: Breath sounds normal.  Abdominal: Soft. She exhibits no distension and no mass. There is no tenderness. No hernia.  Genitourinary:  Genitourinary Comments: External genitalia-mild vulvar hyperemia without epithelial skin breakdown or leukoplakia, symmetric BUS normal Vagina-fair estrogen effect Cervix-no lesions; no cervical motion tenderness Uterus-midplane, norm; al size and shape, mobile, nontender Adnexa-nonpalpable and nontender Rectovaginal-normal external exam; internal exam deferred due to upcoming colonoscopy  Musculoskeletal: Normal range of motion. She exhibits no edema or tenderness.  Lymphadenopathy:    She has no cervical adenopathy.  Neurological: She is alert and oriented to person,  place, and time.  Skin: Skin is warm and dry. No rash noted. No erythema.  Psychiatric: She has a normal mood and affect. Her behavior is normal.    Assessment:   Annual gynecologic examination 72 y.o. Contraception: post menopausal status Normal BMI Patient Active Problem List   Diagnosis Date Noted  . Vaginal atrophy 02/07/2016  . Vulvitis 02/07/2016  . Menopause 02/07/2016  . Osteopenia 02/07/2016  . HPV test positive 02/07/2016  . Essential hypertension 02/07/2016  . Diverticulosis 02/07/2016    Plan:  Pap: Not needed and Not done Mammogram: utd Labs:thru pcp Routine preventative health maintenance measures emphasized: Diet/Weight control, Tobacco Cessation and Alcohol/Drug use Refill Estrace cream; continue intravaginal twice weekly Continue with calcium and vitamin D supplementation; alternative medication management for osteopenia was reviewed Patient may use Mycolog cream topically to the vulva twice a day for 10 to 14 days for vulvar irritation Return to [place], CMA  Brayton Mars, MD   Note: This dictation was prepared with Dragon dictation along with smaller phrase technology. Any transcriptional errors that result from this process are unintentional.

## 2018-03-18 ENCOUNTER — Ambulatory Visit (INDEPENDENT_AMBULATORY_CARE_PROVIDER_SITE_OTHER): Payer: PPO | Admitting: Obstetrics and Gynecology

## 2018-03-18 ENCOUNTER — Encounter: Payer: Self-pay | Admitting: Obstetrics and Gynecology

## 2018-03-18 VITALS — BP 120/67 | HR 69 | Ht 64.0 in | Wt 140.4 lb

## 2018-03-18 DIAGNOSIS — I10 Essential (primary) hypertension: Secondary | ICD-10-CM

## 2018-03-18 DIAGNOSIS — Z01419 Encounter for gynecological examination (general) (routine) without abnormal findings: Secondary | ICD-10-CM | POA: Diagnosis not present

## 2018-03-18 DIAGNOSIS — Z1211 Encounter for screening for malignant neoplasm of colon: Secondary | ICD-10-CM

## 2018-03-18 DIAGNOSIS — Z780 Asymptomatic menopausal state: Secondary | ICD-10-CM

## 2018-03-18 DIAGNOSIS — M8580 Other specified disorders of bone density and structure, unspecified site: Secondary | ICD-10-CM | POA: Diagnosis not present

## 2018-03-18 DIAGNOSIS — N952 Postmenopausal atrophic vaginitis: Secondary | ICD-10-CM | POA: Diagnosis not present

## 2018-03-18 NOTE — Patient Instructions (Signed)
1.  Pap smear not done.  No further Pap smears are needed. 2.  Mammogram already done this year 3.  Screening labs are to be done through primary care 4.  Screening colonoscopy is scheduled in 2 weeks 5.  Continue with healthy eating and exercise. 6.  Continue with calcium and vitamin D supplementation daily 7.  Continue with Estrace cream intravaginal twice a week 8.  Recommend Mycolog topically to the vulva twice a day for 10 days for vulvar irritation 9.  Return in 1 year for annual exam   Health Maintenance for Postmenopausal Women Menopause is a normal process in which your reproductive ability comes to an end. This process happens gradually over a span of months to years, usually between the ages of 75 and 72. Menopause is complete when you have missed 12 consecutive menstrual periods. It is important to talk with your health care provider about some of the most common conditions that affect postmenopausal women, such as heart disease, cancer, and bone loss (osteoporosis). Adopting a healthy lifestyle and getting preventive care can help to promote your health and wellness. Those actions can also lower your chances of developing some of these common conditions. What should I know about menopause? During menopause, you may experience a number of symptoms, such as:  Moderate-to-severe hot flashes.  Night sweats.  Decrease in sex drive.  Mood swings.  Headaches.  Tiredness.  Irritability.  Memory problems.  Insomnia.  Choosing to treat or not to treat menopausal changes is an individual decision that you make with your health care provider. What should I know about hormone replacement therapy and supplements? Hormone therapy products are effective for treating symptoms that are associated with menopause, such as hot flashes and night sweats. Hormone replacement carries certain risks, especially as you become older. If you are thinking about using estrogen or estrogen with  progestin treatments, discuss the benefits and risks with your health care provider. What should I know about heart disease and stroke? Heart disease, heart attack, and stroke become more likely as you age. This may be due, in part, to the hormonal changes that your body experiences during menopause. These can affect how your body processes dietary fats, triglycerides, and cholesterol. Heart attack and stroke are both medical emergencies. There are many things that you can do to help prevent heart disease and stroke:  Have your blood pressure checked at least every 1-2 years. High blood pressure causes heart disease and increases the risk of stroke.  If you are 40-37 years old, ask your health care provider if you should take aspirin to prevent a heart attack or a stroke.  Do not use any tobacco products, including cigarettes, chewing tobacco, or electronic cigarettes. If you need help quitting, ask your health care provider.  It is important to eat a healthy diet and maintain a healthy weight. ? Be sure to include plenty of vegetables, fruits, low-fat dairy products, and lean protein. ? Avoid eating foods that are high in solid fats, added sugars, or salt (sodium).  Get regular exercise. This is one of the most important things that you can do for your health. ? Try to exercise for at least 150 minutes each week. The type of exercise that you do should increase your heart rate and make you sweat. This is known as moderate-intensity exercise. ? Try to do strengthening exercises at least twice each week. Do these in addition to the moderate-intensity exercise.  Know your numbers.Ask your health care provider  to check your cholesterol and your blood glucose. Continue to have your blood tested as directed by your health care provider.  What should I know about cancer screening? There are several types of cancer. Take the following steps to reduce your risk and to catch any cancer development as  early as possible. Breast Cancer  Practice breast self-awareness. ? This means understanding how your breasts normally appear and feel. ? It also means doing regular breast self-exams. Let your health care provider know about any changes, no matter how small.  If you are 41 or older, have a clinician do a breast exam (clinical breast exam or CBE) every year. Depending on your age, family history, and medical history, it may be recommended that you also have a yearly breast X-ray (mammogram).  If you have a family history of breast cancer, talk with your health care provider about genetic screening.  If you are at high risk for breast cancer, talk with your health care provider about having an MRI and a mammogram every year.  Breast cancer (BRCA) gene test is recommended for women who have family members with BRCA-related cancers. Results of the assessment will determine the need for genetic counseling and BRCA1 and for BRCA2 testing. BRCA-related cancers include these types: ? Breast. This occurs in males or females. ? Ovarian. ? Tubal. This may also be called fallopian tube cancer. ? Cancer of the abdominal or pelvic lining (peritoneal cancer). ? Prostate. ? Pancreatic.  Cervical, Uterine, and Ovarian Cancer Your health care provider may recommend that you be screened regularly for cancer of the pelvic organs. These include your ovaries, uterus, and vagina. This screening involves a pelvic exam, which includes checking for microscopic changes to the surface of your cervix (Pap test).  For women ages 21-65, health care providers may recommend a pelvic exam and a Pap test every three years. For women ages 52-65, they may recommend the Pap test and pelvic exam, combined with testing for human papilloma virus (HPV), every five years. Some types of HPV increase your risk of cervical cancer. Testing for HPV may also be done on women of any age who have unclear Pap test results.  Other health  care providers may not recommend any screening for nonpregnant women who are considered low risk for pelvic cancer and have no symptoms. Ask your health care provider if a screening pelvic exam is right for you.  If you have had past treatment for cervical cancer or a condition that could lead to cancer, you need Pap tests and screening for cancer for at least 20 years after your treatment. If Pap tests have been discontinued for you, your risk factors (such as having a new sexual partner) need to be reassessed to determine if you should start having screenings again. Some women have medical problems that increase the chance of getting cervical cancer. In these cases, your health care provider may recommend that you have screening and Pap tests more often.  If you have a family history of uterine cancer or ovarian cancer, talk with your health care provider about genetic screening.  If you have vaginal bleeding after reaching menopause, tell your health care provider.  There are currently no reliable tests available to screen for ovarian cancer.  Lung Cancer Lung cancer screening is recommended for adults 34-35 years old who are at high risk for lung cancer because of a history of smoking. A yearly low-dose CT scan of the lungs is recommended if you:  Currently  smoke.  Have a history of at least 30 pack-years of smoking and you currently smoke or have quit within the past 15 years. A pack-year is smoking an average of one pack of cigarettes per day for one year.  Yearly screening should:  Continue until it has been 15 years since you quit.  Stop if you develop a health problem that would prevent you from having lung cancer treatment.  Colorectal Cancer  This type of cancer can be detected and can often be prevented.  Routine colorectal cancer screening usually begins at age 22 and continues through age 36.  If you have risk factors for colon cancer, your health care provider may  recommend that you be screened at an earlier age.  If you have a family history of colorectal cancer, talk with your health care provider about genetic screening.  Your health care provider may also recommend using home test kits to check for hidden blood in your stool.  A small camera at the end of a tube can be used to examine your colon directly (sigmoidoscopy or colonoscopy). This is done to check for the earliest forms of colorectal cancer.  Direct examination of the colon should be repeated every 5-10 years until age 78. However, if early forms of precancerous polyps or small growths are found or if you have a family history or genetic risk for colorectal cancer, you may need to be screened more often.  Skin Cancer  Check your skin from head to toe regularly.  Monitor any moles. Be sure to tell your health care provider: ? About any new moles or changes in moles, especially if there is a change in a mole's shape or color. ? If you have a mole that is larger than the size of a pencil eraser.  If any of your family members has a history of skin cancer, especially at a young age, talk with your health care provider about genetic screening.  Always use sunscreen. Apply sunscreen liberally and repeatedly throughout the day.  Whenever you are outside, protect yourself by wearing long sleeves, pants, a wide-brimmed hat, and sunglasses.  What should I know about osteoporosis? Osteoporosis is a condition in which bone destruction happens more quickly than new bone creation. After menopause, you may be at an increased risk for osteoporosis. To help prevent osteoporosis or the bone fractures that can happen because of osteoporosis, the following is recommended:  If you are 42-50 years old, get at least 1,000 mg of calcium and at least 600 mg of vitamin D per day.  If you are older than age 76 but younger than age 41, get at least 1,200 mg of calcium and at least 600 mg of vitamin D per  day.  If you are older than age 38, get at least 1,200 mg of calcium and at least 800 mg of vitamin D per day.  Smoking and excessive alcohol intake increase the risk of osteoporosis. Eat foods that are rich in calcium and vitamin D, and do weight-bearing exercises several times each week as directed by your health care provider. What should I know about how menopause affects my mental health? Depression may occur at any age, but it is more common as you become older. Common symptoms of depression include:  Low or sad mood.  Changes in sleep patterns.  Changes in appetite or eating patterns.  Feeling an overall lack of motivation or enjoyment of activities that you previously enjoyed.  Frequent crying spells.  Talk  with your health care provider if you think that you are experiencing depression. What should I know about immunizations? It is important that you get and maintain your immunizations. These include:  Tetanus, diphtheria, and pertussis (Tdap) booster vaccine.  Influenza every year before the flu season begins.  Pneumonia vaccine.  Shingles vaccine.  Your health care provider may also recommend other immunizations. This information is not intended to replace advice given to you by your health care provider. Make sure you discuss any questions you have with your health care provider. Document Released: 10/17/2005 Document Revised: 03/14/2016 Document Reviewed: 05/29/2015 Elsevier Interactive Patient Education  2018 [employer]  

## 2018-03-31 ENCOUNTER — Ambulatory Visit
Admission: RE | Admit: 2018-03-31 | Discharge: 2018-03-31 | Disposition: A | Discharge status: Home / Self Care | Payer: PPO | Source: Ambulatory Visit | Attending: Unknown Physician Specialty | Admitting: Unknown Physician Specialty

## 2018-03-31 ENCOUNTER — Encounter
Admission: RE | Disposition: A | Discharge status: Home / Self Care | Payer: Self-pay | Source: Ambulatory Visit | Attending: Unknown Physician Specialty

## 2018-03-31 ENCOUNTER — Ambulatory Visit: Payer: PPO | Admitting: Family

## 2018-03-31 ENCOUNTER — Encounter: Payer: Self-pay | Admitting: Anesthesiology

## 2018-03-31 DIAGNOSIS — M1990 Unspecified osteoarthritis, unspecified site: Secondary | ICD-10-CM | POA: Insufficient documentation

## 2018-03-31 DIAGNOSIS — Z7951 Long term (current) use of inhaled steroids: Secondary | ICD-10-CM | POA: Diagnosis not present

## 2018-03-31 DIAGNOSIS — K219 Gastro-esophageal reflux disease without esophagitis: Secondary | ICD-10-CM | POA: Diagnosis present

## 2018-03-31 DIAGNOSIS — K449 Diaphragmatic hernia without obstruction or gangrene: Secondary | ICD-10-CM | POA: Insufficient documentation

## 2018-03-31 DIAGNOSIS — K5790 Diverticulosis of intestine, part unspecified, without perforation or abscess without bleeding: Secondary | ICD-10-CM | POA: Diagnosis not present

## 2018-03-31 DIAGNOSIS — K648 Other hemorrhoids: Secondary | ICD-10-CM | POA: Diagnosis not present

## 2018-03-31 DIAGNOSIS — Z79890 Hormone replacement therapy (postmenopausal): Secondary | ICD-10-CM | POA: Insufficient documentation

## 2018-03-31 DIAGNOSIS — K209 Esophagitis, unspecified: Secondary | ICD-10-CM | POA: Diagnosis not present

## 2018-03-31 DIAGNOSIS — Z1211 Encounter for screening for malignant neoplasm of colon: Secondary | ICD-10-CM | POA: Insufficient documentation

## 2018-03-31 DIAGNOSIS — I10 Essential (primary) hypertension: Secondary | ICD-10-CM | POA: Insufficient documentation

## 2018-03-31 DIAGNOSIS — K210 Gastro-esophageal reflux disease with esophagitis: Secondary | ICD-10-CM | POA: Insufficient documentation

## 2018-03-31 DIAGNOSIS — K5730 Diverticulosis of large intestine without perforation or abscess without bleeding: Secondary | ICD-10-CM | POA: Diagnosis not present

## 2018-03-31 DIAGNOSIS — K208 Other esophagitis: Secondary | ICD-10-CM | POA: Diagnosis not present

## 2018-03-31 DIAGNOSIS — K635 Polyp of colon: Secondary | ICD-10-CM | POA: Diagnosis not present

## 2018-03-31 DIAGNOSIS — M8580 Other specified disorders of bone density and structure, unspecified site: Secondary | ICD-10-CM | POA: Diagnosis not present

## 2018-03-31 DIAGNOSIS — Z79899 Other long term (current) drug therapy: Secondary | ICD-10-CM | POA: Diagnosis not present

## 2018-03-31 DIAGNOSIS — K222 Esophageal obstruction: Secondary | ICD-10-CM | POA: Diagnosis not present

## 2018-03-31 DIAGNOSIS — R12 Heartburn: Secondary | ICD-10-CM | POA: Diagnosis not present

## 2018-03-31 DIAGNOSIS — C189 Malignant neoplasm of colon, unspecified: Secondary | ICD-10-CM | POA: Diagnosis not present

## 2018-03-31 HISTORY — PX: ESOPHAGOGASTRODUODENOSCOPY (EGD) WITH PROPOFOL: SHX5813

## 2018-03-31 HISTORY — PX: COLONOSCOPY WITH PROPOFOL: SHX5780

## 2018-03-31 SURGERY — COLONOSCOPY WITH PROPOFOL
Anesthesia: General

## 2018-03-31 MED ORDER — SODIUM CHLORIDE 0.9 % IV SOLN
INTRAVENOUS | Status: DC
  Administered 2018-03-31: 1000 mL via INTRAVENOUS

## 2018-03-31 MED ORDER — PROPOFOL 10 MG/ML IV BOLUS
INTRAVENOUS | Status: DC | PRN
  Administered 2018-03-31: 30 mg via INTRAVENOUS
  Administered 2018-03-31: 80 mg via INTRAVENOUS
  Administered 2018-03-31: 20 mg via INTRAVENOUS

## 2018-03-31 MED ORDER — LIDOCAINE HCL (PF) 1 % IJ SOLN
2.0000 mL | Freq: Once | INTRAMUSCULAR | Status: AC
  Administered 2018-03-31: 0.3 mL via INTRADERMAL

## 2018-03-31 MED ORDER — LIDOCAINE HCL (PF) 1 % IJ SOLN
INTRAMUSCULAR | Status: AC
  Administered 2018-03-31: 0.3 mL via INTRADERMAL
  Filled 2018-03-31: qty 2

## 2018-03-31 MED ORDER — PROPOFOL 500 MG/50ML IV EMUL
INTRAVENOUS | Status: AC
  Filled 2018-03-31: qty 50

## 2018-03-31 MED ORDER — PIPERACILLIN-TAZOBACTAM 3.375 G IVPB 30 MIN
3.3750 g | Freq: Once | INTRAVENOUS | Status: AC
  Administered 2018-03-31: 3.375 g via INTRAVENOUS
  Filled 2018-03-31: qty 50

## 2018-03-31 MED ORDER — PIPERACILLIN-TAZOBACTAM 3.375 G IVPB
INTRAVENOUS | Status: AC
  Filled 2018-03-31: qty 50

## 2018-03-31 MED ORDER — PROPOFOL 500 MG/50ML IV EMUL
INTRAVENOUS | Status: DC | PRN
  Administered 2018-03-31: 110 ug/kg/min via INTRAVENOUS

## 2018-03-31 MED ORDER — SODIUM CHLORIDE 0.9 % IV SOLN: INTRAVENOUS | Status: DC

## 2018-03-31 NOTE — Op Note (Signed)
[facility] Gastroenterology Patient Name: Christina Foley Procedure Date: 03/31/2018 7:18 AM MRN: [id number] Account #: [id number] Date of Birth: 01-02-1946 Admit Type: Outpatient Age: 72 Room: [facility] ENDO ROOM 3 Gender: Female Note Status: Finalized Procedure:            Upper GI endoscopy Indications:          Heartburn, Follow-up of gastro-esophageal reflux disease Providers:            Manya Silvas, MD Referring MD:         Rusty Aus, MD (Referring MD) Medicines:            Propofol per Anesthesia Complications:        No immediate complications. Procedure:            Pre-Anesthesia Assessment:                       - After reviewing the risks and benefits, the patient                        was deemed in satisfactory condition to undergo the                        procedure.                       After obtaining informed consent, the endoscope was                        passed under direct vision. Throughout the procedure,                        the patient's blood pressure, pulse, and oxygen                        saturations were monitored continuously. The Endoscope                        was introduced through the mouth, and advanced to the                        second part of duodenum. The upper GI endoscopy was                        accomplished without difficulty. The patient tolerated                        the procedure well. Findings:      LA Grade A (one or more mucosal breaks less than 5 mm, not extending       between tops of 2 mucosal folds) esophagitis with no bleeding was found       36 cm from the incisors. Biopsies were taken with a cold forceps for       histology. At the end of the procedure I passed a guidewire into the       stomach and removed thet scope And passed a 45F Savary dilator.      A small hiatal hernia was present. Stomach other wise normal.      The examined duodenum was normal. Impression:           - LA  Grade A reflux esophagitis.  Rule out Barrett's                        esophagus. Biopsied.                       - Small hiatal hernia.                       - Normal examined duodenum. Recommendation:       - Await pathology results.                       - Perform a colonoscopy as previously scheduled. Manya Silvas, MD 03/31/2018 7:51:52 AM This report has been signed electronically. Number of Addenda: 0 Note Initiated On: 03/31/2018 7:18 AM      [facility]

## 2018-03-31 NOTE — Anesthesia Post-op Follow-up Note (Signed)
Anesthesia QCDR form completed.        

## 2018-03-31 NOTE — Anesthesia Preprocedure Evaluation (Signed)
Anesthesia Evaluation  Patient identified by MRN, date of birth, ID band Patient awake    Reviewed: Allergy & Precautions, NPO status , Patient's Chart, lab work & pertinent test results  History of Anesthesia Complications Negative for: history of anesthetic complications  Airway Mallampati: II  TM Distance: >3 FB Neck ROM: Full    Dental  (+)    Pulmonary neg pulmonary ROS,    Pulmonary exam normal breath sounds clear to auscultation       Cardiovascular Exercise Tolerance: Good hypertension, Normal cardiovascular exam+ Valvular Problems/Murmurs  Rhythm:Regular Rate:Normal + Systolic murmurs    Neuro/Psych negative neurological ROS  negative psych ROS   GI/Hepatic Neg liver ROS, GERD  ,  Endo/Other  negative endocrine ROS  Renal/GU negative Renal ROS  negative genitourinary   Musculoskeletal  (+) Arthritis , Osteoarthritis,    Abdominal Normal abdominal exam  (+)   Peds  Hematology negative hematology ROS (+)   Anesthesia Other Findings   Reproductive/Obstetrics                             Anesthesia Physical  Anesthesia Plan  ASA: II  Anesthesia Plan: General   Post-op Pain Management:  Regional for Post-op pain and GA combined w/ Regional for post-op pain   Induction: Intravenous  PONV Risk Score and Plan: 2 and TIVA  Airway Management Planned: Nasal Cannula  Additional Equipment:   Intra-op Plan:   Post-operative Plan: Extubation in OR  Informed Consent: I have reviewed the patients History and Physical, chart, labs and discussed the procedure including the risks, benefits and alternatives for the proposed anesthesia with the patient or authorized representative who has indicated his/her understanding and acceptance.   Dental advisory given  Plan Discussed with: CRNA and Surgeon  Anesthesia Plan Comments:         Anesthesia Quick Evaluation

## 2018-03-31 NOTE — Op Note (Signed)
[facility] Gastroenterology Patient Name: Christina Foley Procedure Date: 03/31/2018 7:19 AM MRN: [id number] Account #: [id number] Date of Birth: 07-17-1946 Admit Type: Outpatient Age: 71 Room: [facility] ENDO ROOM 3 Gender: Female Note Status: Finalized Procedure:            Colonoscopy Indications:          Screening for colorectal malignant neoplasm Providers:            Manya Silvas, MD Referring MD:         Rusty Aus, MD (Referring MD) Medicines:            Propofol per Anesthesia Complications:        No immediate complications. Procedure:            Pre-Anesthesia Assessment:                       - After reviewing the risks and benefits, the patient                        was deemed in satisfactory condition to undergo the                        procedure.                       After obtaining informed consent, the colonoscope was                        passed under direct vision. Throughout the procedure,                        the patient's blood pressure, pulse, and oxygen                        saturations were monitored continuously. The                        Colonoscope was introduced through the anus and                        advanced to the the cecum, identified by appendiceal                        orifice and ileocecal valve. The colonoscopy was                        performed without difficulty. The patient tolerated the                        procedure well. The quality of the bowel preparation                        was good. Findings:      A diminutive polyp was found in the cecum. The polyp was sessile. The       polyp was removed with a jumbo cold forceps. Resection and retrieval       were complete.      Many small-mouthed diverticula were found in the sigmoid colon,       descending colon, transverse colon and ascending colon.      The exam was otherwise without abnormality.  Impression:           - One diminutive polyp in  the cecum, removed with a                        jumbo cold forceps. Resected and retrieved.                       - Diverticulosis in the sigmoid colon, in the                        descending colon, in the transverse colon and in the                        ascending colon.                       - The examination was otherwise normal. Recommendation:       - Await pathology results. Manya Silvas, MD 03/31/2018 8:16:27 AM This report has been signed electronically. Number of Addenda: 0 Note Initiated On: 03/31/2018 7:19 AM Scope Withdrawal Time: 0 hours 8 minutes 15 seconds  Total Procedure Duration: 0 hours 17 minutes 57 seconds       [facility]

## 2018-03-31 NOTE — Transfer of Care (Signed)
Immediate Anesthesia Transfer of Care Note  Patient: Christina Foley  Procedure(s) Performed: COLONOSCOPY WITH PROPOFOL (N/A ) ESOPHAGOGASTRODUODENOSCOPY (EGD) WITH PROPOFOL (N/A )  Patient Location: PACU  Anesthesia Type:General  Level of Consciousness: drowsy  Airway & Oxygen Therapy: Patient Spontanous Breathing and Patient connected to nasal cannula oxygen  Post-op Assessment: Report given to RN and Post -op Vital signs reviewed and stable  Post vital signs: Reviewed and stable  Last Vitals:  Vitals Value Taken Time  BP    Temp 36.1 C 03/31/2018  8:15 AM  Pulse 51 03/31/2018  8:16 AM  Resp 16 03/31/2018  8:16 AM  SpO2 96 % 03/31/2018  8:16 AM  Vitals shown include unvalidated device data.  Last Pain:  Vitals:   03/31/18 0815  TempSrc: Tympanic  PainSc:          Complications: No apparent anesthesia complications

## 2018-03-31 NOTE — H&P (Signed)
Primary Care Physician:  Rusty Aus, MD Primary Gastroenterologist:  Dr. Vira Agar  Pre-Procedure History & Physical: HPI:  Christina Foley is a 72 y.o. female is here for an endoscopy and colonoscopy.  Done for screening colonoscopy and GERD.   Past Medical History:  Diagnosis Date  . Back pain   . Decreased libido   . Diverticulitis   . Environmental allergies    seasonal  . GERD (gastroesophageal reflux disease)   . Heart murmur    1970s  . HPV test positive   . Hypertension   . Menopausal and postmenopausal disorder   . Menopause   . Osteoarthritis   . Osteopenia   . Postmenopausal atrophic vaginitis   . Rectocele   . Shoulder pain, right    rotator cuff tear    Past Surgical History:  Procedure Laterality Date  . BREAST CYST ASPIRATION Bilateral 2000   benign  . COLONOSCOPY    . CRYOTHERAPY    . ESOPHAGOGASTRODUODENOSCOPY    . EYE SURGERY Left    cataract   . FOOT SURGERY    . JOINT REPLACEMENT Right    total hip  . SHOULDER ARTHROSCOPY Right 10/28/2017   Procedure: ARTHROSCOPY SHOULDER EITH DEBRIDEMENT DECOMPRESSIOIN AND REAPIR OF ROTATOR CUFF REPAIR;  Surgeon: Corky Mull, MD;  Location: [facility];  Service: Orthopedics;  Laterality: Right;  . TUBAL LIGATION      Prior to Admission medications   Medication Sig Start Date End Date Taking? Authorizing Provider  fluticasone (FLONASE) 50 MCG/ACT nasal spray Place 2 sprays into both nostrils daily.   Yes [provider]  metaxalone (SKELAXIN) 800 MG tablet Take 800 mg by mouth 3 (three) times daily. As needed   Yes [provider]  omeprazole (PRILOSEC) 20 MG capsule Take 20 mg by mouth daily.   Yes [provider]  acetaminophen (TYLENOL) 500 MG tablet Take 500 mg by mouth every 6 (six) hours as needed.    [provider]  ALPRAZolam Duanne Moron) 0.25 MG tablet Take by mouth. 05/03/15   [provider]  Biotin 2500 MCG CAPS Take by mouth daily.     [provider]  calcium carbonate (OS-CAL - DOSED IN MG OF ELEMENTAL CALCIUM) 1250 (500 Ca) MG tablet Take by mouth.    [provider]  Cholecalciferol (VITAMIN D3) 2000 units TABS Take by mouth daily.    [provider]  diphenoxylate-atropine (LOMOTIL) 2.5-0.025 MG tablet Take 1 tablet by mouth 4 (four) times daily as needed.  05/03/15   [provider]  EPINEPHrine (EPIPEN IJ) Inject as directed. For yellow jacket stings    [provider]  estradiol (ESTRACE VAGINAL) 0.1 MG/GM vaginal cream Place 0.5 Applicatorfuls vaginally 2 (two) times a week. 03/19/17   Defrancesco, Alanda Slim, MD  Lactobacillus (PROBIOTIC ACIDOPHILUS) TABS Take by mouth.    [provider]  lisinopril-hydrochlorothiazide (PRINZIDE,ZESTORETIC) 20-12.5 MG tablet Take by mouth. 05/03/15   [provider]  Loratadine 10 MG CAPS Take by mouth.    [provider]  Omega 3-6-9 Fatty Acids (OMEGA-3 & OMEGA-6 FISH OIL PO) Take by mouth.    [provider]  ranitidine (ZANTAC) 150 MG capsule Take 150 mg by mouth 2 (two) times daily.    [provider]  vitamin C (ASCORBIC ACID) 500 MG tablet Take by mouth daily.     [provider]    Allergies as of 01/08/2018 - Review Complete 10/28/2017  Allergen Reaction Noted  .  Alendronate Other (See Comments) 09/11/2016    Family History  Problem Relation Age of Onset  . Breast cancer Maternal Aunt 70  . Thyroid cancer Mother   . Heart disease Father   . Hypertension Father   . Depression Paternal Grandmother   . Hypertension Brother   . Ovarian cancer Neg Hx     Social History   Socioeconomic History  . Marital status: Married    Spouse name: Not on file  . Number of children: Not on file  . Years of education: Not on file  . Highest education level: Not on file  Occupational History  . Not on file  Social Needs  . Financial resource strain: Not on file  . Food insecurity:     Worry: Not on file    Inability: Not on file  . Transportation needs:    Medical: Not on file    Non-medical: Not on file  Tobacco Use  . Smoking status: Never Smoker  . Smokeless tobacco: Never Used  Substance and Sexual Activity  . Alcohol use: Yes    Alcohol/week: 1.2 oz    Types: 2 Glasses of wine per week    Comment: occas  . Drug use: No  . Sexual activity: Yes    Birth control/protection: Post-menopausal  Lifestyle  . Physical activity:    Days per week: 5 days    Minutes per session: 30 min  . Stress: Not on file  Relationships  . Social connections:    Talks on phone: Not on file    Gets together: Not on file    Attends religious service: Not on file    Active member of club or organization: Not on file    Attends meetings of clubs or organizations: Not on file    Relationship status: Not on file  . Intimate partner violence:    Fear of current or ex partner: Not on file    Emotionally abused: Not on file    Physically abused: Not on file    Forced sexual activity: Not on file  Other Topics Concern  . Not on file  Social History Narrative  . Not on file    Review of Systems: See HPI, otherwise negative ROS  Physical Exam: Temp 97.7 F (36.5 C) (Tympanic)   Resp 17   Ht 5\' 4"  (1.626 m)   Wt 61.7 kg (136 lb)   BMI 23.34 kg/m  General:   Alert,  pleasant and cooperative in NAD Head:  Normocephalic and atraumatic. Neck:  Supple; no masses or thyromegaly. Lungs:  Clear throughout to auscultation.    Heart:  Regular rate and rhythm. Abdomen:  Soft, nontender and nondistended. Normal bowel sounds, without guarding, and without rebound.   Neurologic:  Alert and  oriented x4;  grossly normal neurologically.  Impression/Plan: Christina Foley is here for an endoscopy and colonoscopy to be performed for screening colonoscopy and GERD.  Risks, benefits, limitations, and alternatives regarding  endoscopy and colonoscopy have been reviewed with the  patient.  Questions have been answered.  All parties agreeable.   Gaylyn Cheers, MD  03/31/2018, 7:22 AM

## 2018-04-01 ENCOUNTER — Encounter: Payer: Self-pay | Admitting: Unknown Physician Specialty

## 2018-04-01 LAB — SURGICAL PATHOLOGY

## 2018-04-01 NOTE — Anesthesia Postprocedure Evaluation (Signed)
Anesthesia Post Note  Patient: Christina Foley  Procedure(s) Performed: COLONOSCOPY WITH PROPOFOL (N/A ) ESOPHAGOGASTRODUODENOSCOPY (EGD) WITH PROPOFOL (N/A )  Patient location during evaluation: PACU Anesthesia Type: General Level of consciousness: awake and alert and oriented Pain management: pain level controlled Vital Signs Assessment: post-procedure vital signs reviewed and stable Respiratory status: spontaneous breathing Cardiovascular status: blood pressure returned to baseline Anesthetic complications: no     Last Vitals:  Vitals:   03/31/18 0815 03/31/18 0818  BP:  (!) 90/50  Resp:    Temp: (!) 36.1 C     Last Pain:  Vitals:   04/01/18 0741  TempSrc:   PainSc: 0-No pain                 Solon Alban

## 2018-05-17 DIAGNOSIS — E782 Mixed hyperlipidemia: Secondary | ICD-10-CM | POA: Diagnosis not present

## 2018-05-17 DIAGNOSIS — Z79899 Other long term (current) drug therapy: Secondary | ICD-10-CM | POA: Diagnosis not present

## 2018-05-24 DIAGNOSIS — Z0000 Encounter for general adult medical examination without abnormal findings: Secondary | ICD-10-CM | POA: Diagnosis not present

## 2018-05-24 DIAGNOSIS — Z79899 Other long term (current) drug therapy: Secondary | ICD-10-CM | POA: Diagnosis not present

## 2018-05-24 DIAGNOSIS — M818 Other osteoporosis without current pathological fracture: Secondary | ICD-10-CM | POA: Diagnosis not present

## 2018-05-24 DIAGNOSIS — E782 Mixed hyperlipidemia: Secondary | ICD-10-CM | POA: Diagnosis not present

## 2018-06-22 DIAGNOSIS — M810 Age-related osteoporosis without current pathological fracture: Secondary | ICD-10-CM | POA: Diagnosis not present

## 2018-07-26 DIAGNOSIS — D2261 Melanocytic nevi of right upper limb, including shoulder: Secondary | ICD-10-CM | POA: Diagnosis not present

## 2018-07-26 DIAGNOSIS — Z872 Personal history of diseases of the skin and subcutaneous tissue: Secondary | ICD-10-CM | POA: Diagnosis not present

## 2018-07-26 DIAGNOSIS — I788 Other diseases of capillaries: Secondary | ICD-10-CM | POA: Diagnosis not present

## 2018-07-26 DIAGNOSIS — D2272 Melanocytic nevi of left lower limb, including hip: Secondary | ICD-10-CM | POA: Diagnosis not present

## 2018-07-26 DIAGNOSIS — Z85828 Personal history of other malignant neoplasm of skin: Secondary | ICD-10-CM | POA: Diagnosis not present

## 2018-07-26 DIAGNOSIS — D2262 Melanocytic nevi of left upper limb, including shoulder: Secondary | ICD-10-CM | POA: Diagnosis not present

## 2018-07-29 DIAGNOSIS — K219 Gastro-esophageal reflux disease without esophagitis: Secondary | ICD-10-CM | POA: Diagnosis not present

## 2018-07-29 DIAGNOSIS — M810 Age-related osteoporosis without current pathological fracture: Secondary | ICD-10-CM | POA: Diagnosis not present

## 2018-08-10 DIAGNOSIS — H169 Unspecified keratitis: Secondary | ICD-10-CM | POA: Diagnosis not present

## 2018-08-26 DIAGNOSIS — M810 Age-related osteoporosis without current pathological fracture: Secondary | ICD-10-CM | POA: Diagnosis not present

## 2018-09-13 ENCOUNTER — Other Ambulatory Visit: Payer: Self-pay | Admitting: Internal Medicine

## 2018-09-13 DIAGNOSIS — Z1231 Encounter for screening mammogram for malignant neoplasm of breast: Secondary | ICD-10-CM

## 2018-09-15 DIAGNOSIS — H11001 Unspecified pterygium of right eye: Secondary | ICD-10-CM | POA: Diagnosis not present

## 2018-10-05 DIAGNOSIS — R300 Dysuria: Secondary | ICD-10-CM | POA: Diagnosis not present

## 2018-10-19 ENCOUNTER — Ambulatory Visit
Admission: RE | Admit: 2018-10-19 | Discharge: 2018-10-19 | Disposition: A | Discharge status: Home / Self Care | Payer: PPO | Source: Ambulatory Visit | Attending: Internal Medicine | Admitting: Internal Medicine

## 2018-10-19 DIAGNOSIS — Z1231 Encounter for screening mammogram for malignant neoplasm of breast: Secondary | ICD-10-CM | POA: Diagnosis not present

## 2018-10-27 DIAGNOSIS — M25552 Pain in left hip: Secondary | ICD-10-CM | POA: Diagnosis not present

## 2018-10-29 DIAGNOSIS — G8929 Other chronic pain: Secondary | ICD-10-CM | POA: Diagnosis not present

## 2018-10-29 DIAGNOSIS — M76892 Other specified enthesopathies of left lower limb, excluding foot: Secondary | ICD-10-CM | POA: Diagnosis not present

## 2018-10-29 DIAGNOSIS — M7062 Trochanteric bursitis, left hip: Secondary | ICD-10-CM | POA: Diagnosis not present

## 2018-10-29 DIAGNOSIS — M25552 Pain in left hip: Secondary | ICD-10-CM | POA: Diagnosis not present

## 2018-10-29 DIAGNOSIS — M1612 Unilateral primary osteoarthritis, left hip: Secondary | ICD-10-CM | POA: Diagnosis not present

## 2018-11-11 DIAGNOSIS — Z96641 Presence of right artificial hip joint: Secondary | ICD-10-CM | POA: Diagnosis not present

## 2018-11-11 DIAGNOSIS — M1611 Unilateral primary osteoarthritis, right hip: Secondary | ICD-10-CM | POA: Diagnosis not present

## 2019-02-22 DIAGNOSIS — K228 Other specified diseases of esophagus: Secondary | ICD-10-CM | POA: Diagnosis not present

## 2019-02-22 DIAGNOSIS — E782 Mixed hyperlipidemia: Secondary | ICD-10-CM | POA: Diagnosis not present

## 2019-02-22 DIAGNOSIS — M810 Age-related osteoporosis without current pathological fracture: Secondary | ICD-10-CM | POA: Diagnosis not present

## 2019-02-22 DIAGNOSIS — K210 Gastro-esophageal reflux disease with esophagitis: Secondary | ICD-10-CM | POA: Diagnosis not present

## 2019-02-22 DIAGNOSIS — I10 Essential (primary) hypertension: Secondary | ICD-10-CM | POA: Diagnosis not present

## 2019-03-23 ENCOUNTER — Encounter: Payer: PPO | Admitting: Obstetrics and Gynecology

## 2019-03-24 DIAGNOSIS — M810 Age-related osteoporosis without current pathological fracture: Secondary | ICD-10-CM | POA: Diagnosis not present

## 2019-03-31 DIAGNOSIS — M810 Age-related osteoporosis without current pathological fracture: Secondary | ICD-10-CM | POA: Diagnosis not present

## 2019-04-05 DIAGNOSIS — M79671 Pain in right foot: Secondary | ICD-10-CM | POA: Diagnosis not present

## 2019-04-05 DIAGNOSIS — M722 Plantar fascial fibromatosis: Secondary | ICD-10-CM | POA: Diagnosis not present

## 2019-04-26 DIAGNOSIS — M722 Plantar fascial fibromatosis: Secondary | ICD-10-CM | POA: Diagnosis not present

## 2019-04-26 DIAGNOSIS — M79671 Pain in right foot: Secondary | ICD-10-CM | POA: Diagnosis not present

## 2019-04-26 DIAGNOSIS — B070 Plantar wart: Secondary | ICD-10-CM | POA: Diagnosis not present

## 2019-05-19 DIAGNOSIS — M722 Plantar fascial fibromatosis: Secondary | ICD-10-CM | POA: Diagnosis not present

## 2019-05-19 DIAGNOSIS — B070 Plantar wart: Secondary | ICD-10-CM | POA: Diagnosis not present

## 2019-05-20 DIAGNOSIS — M818 Other osteoporosis without current pathological fracture: Secondary | ICD-10-CM | POA: Diagnosis not present

## 2019-05-20 DIAGNOSIS — E782 Mixed hyperlipidemia: Secondary | ICD-10-CM | POA: Diagnosis not present

## 2019-05-20 DIAGNOSIS — Z79899 Other long term (current) drug therapy: Secondary | ICD-10-CM | POA: Diagnosis not present

## 2019-05-27 DIAGNOSIS — E782 Mixed hyperlipidemia: Secondary | ICD-10-CM | POA: Diagnosis not present

## 2019-05-27 DIAGNOSIS — M818 Other osteoporosis without current pathological fracture: Secondary | ICD-10-CM | POA: Diagnosis not present

## 2019-05-27 DIAGNOSIS — Z0000 Encounter for general adult medical examination without abnormal findings: Secondary | ICD-10-CM | POA: Diagnosis not present

## 2019-05-31 ENCOUNTER — Other Ambulatory Visit: Payer: Self-pay | Admitting: Internal Medicine

## 2019-05-31 DIAGNOSIS — N644 Mastodynia: Secondary | ICD-10-CM

## 2019-05-31 DIAGNOSIS — Z1231 Encounter for screening mammogram for malignant neoplasm of breast: Secondary | ICD-10-CM

## 2019-06-02 ENCOUNTER — Ambulatory Visit
Admission: RE | Admit: 2019-06-02 | Discharge: 2019-06-02 | Disposition: A | Discharge status: Home / Self Care | Payer: PPO | Source: Ambulatory Visit | Attending: Internal Medicine | Admitting: Internal Medicine

## 2019-06-02 DIAGNOSIS — N644 Mastodynia: Secondary | ICD-10-CM

## 2019-06-02 DIAGNOSIS — R928 Other abnormal and inconclusive findings on diagnostic imaging of breast: Secondary | ICD-10-CM | POA: Diagnosis not present

## 2019-06-05 IMAGING — MG MM DIGITAL SCREENING BILAT W/ TOMO W/ CAD
8 of 12 series · 8 of 28 positions shown · non-contrast
Comparison: Previous exam(s).

CLINICAL DATA: Screening.

EXAM:
DIGITAL SCREENING BILATERAL MAMMOGRAM WITH TOMO AND CAD

[R CC]
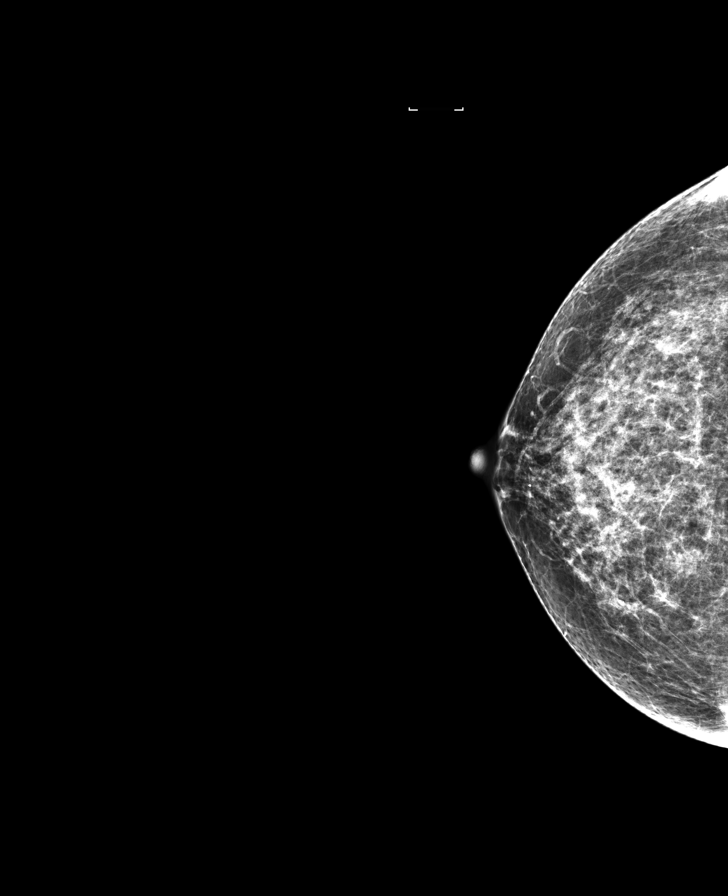

[L CC synth-2D]
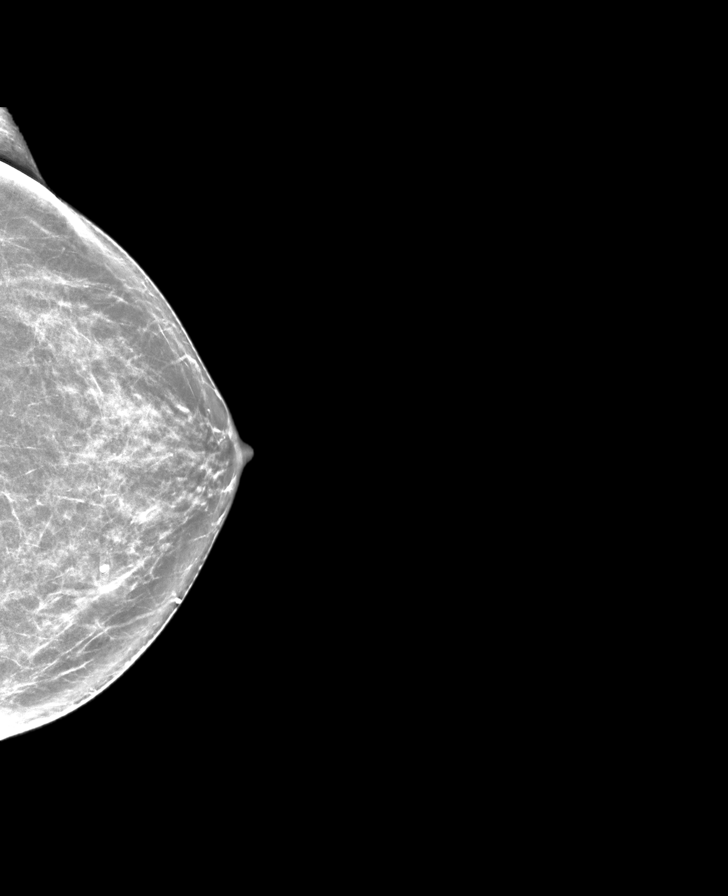

[R MLO]
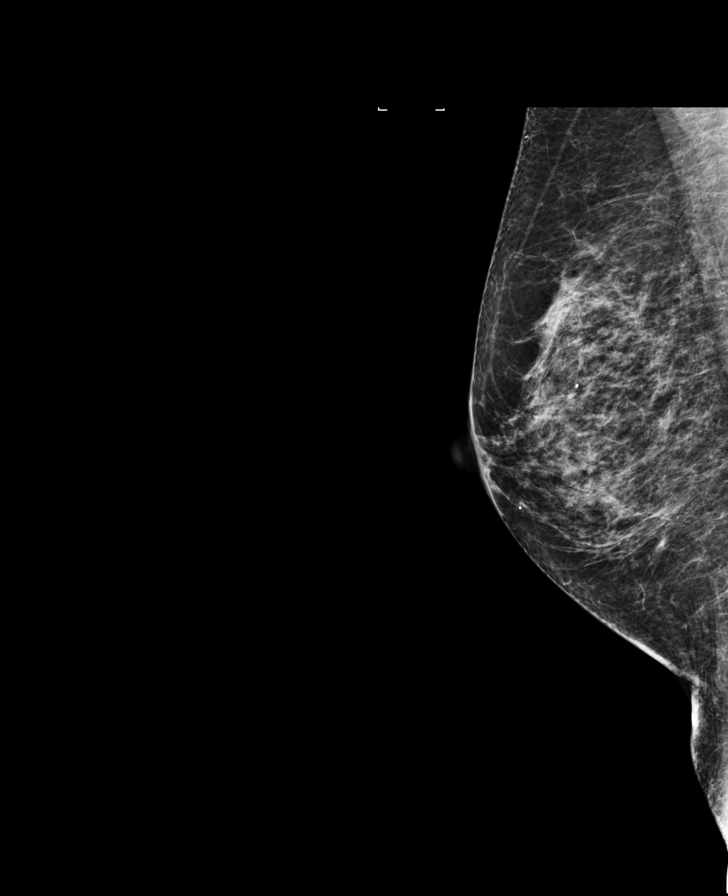

[L CC]
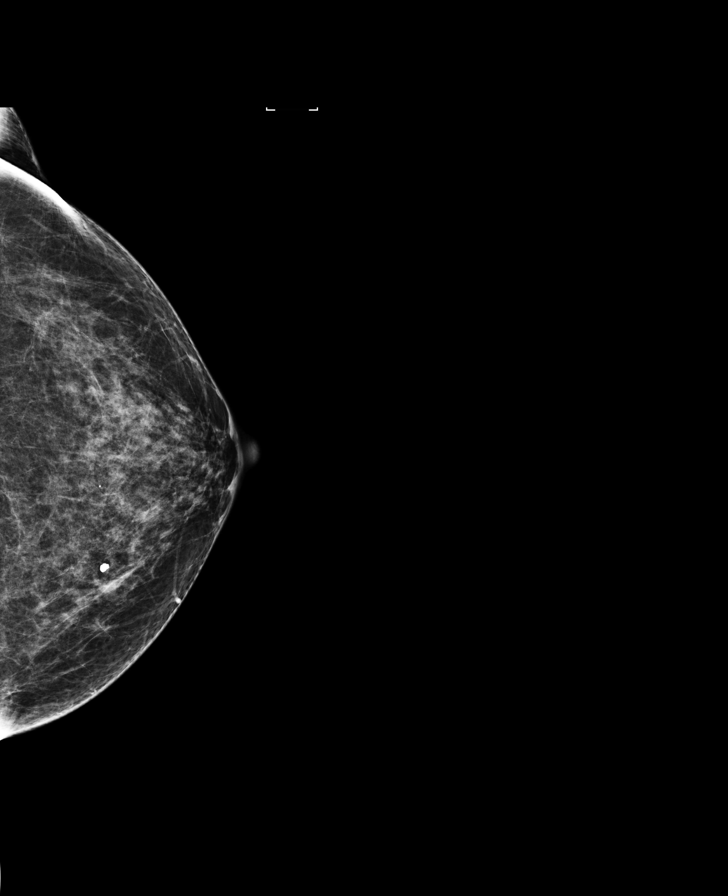

[R CC synth-2D]
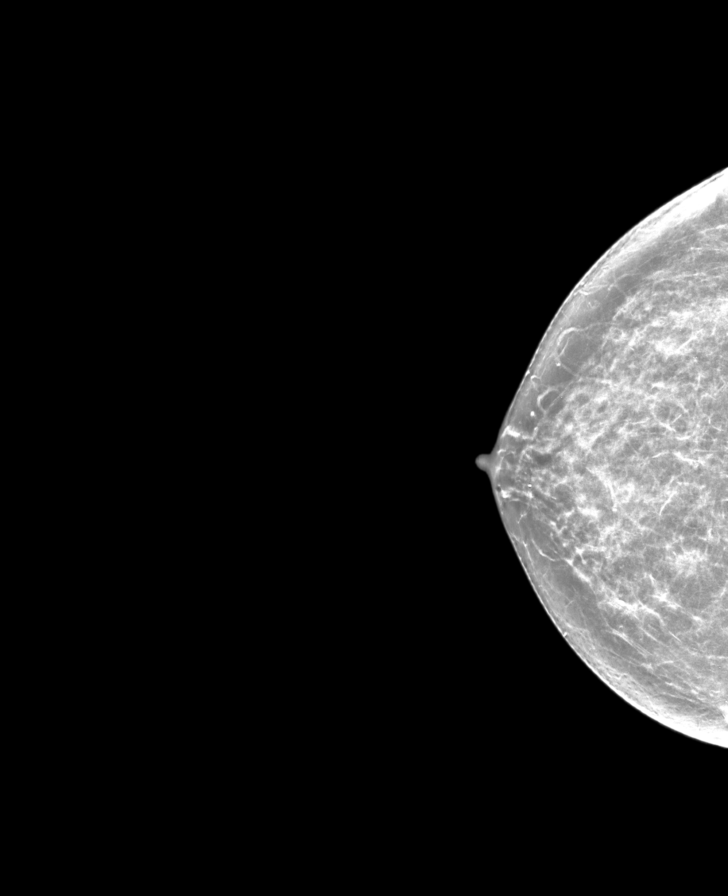

[L MLO synth-2D]
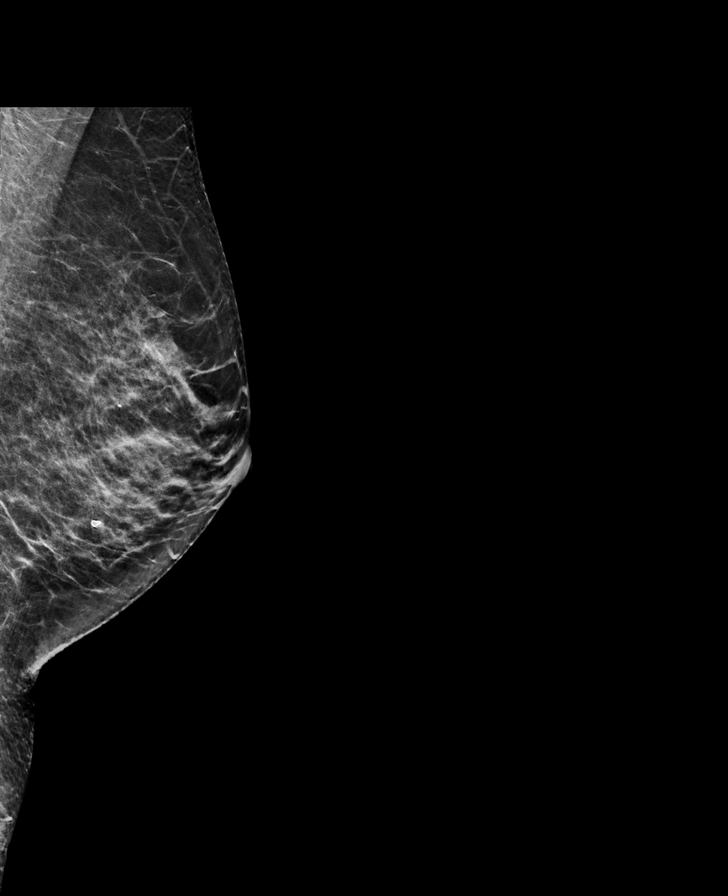

[R MLO synth-2D]
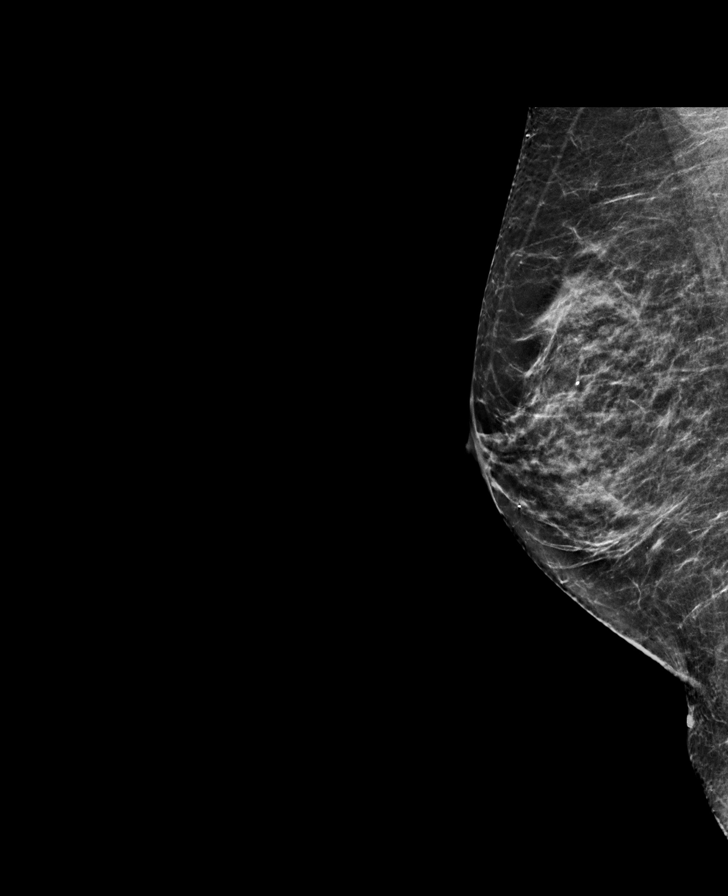

[L MLO]
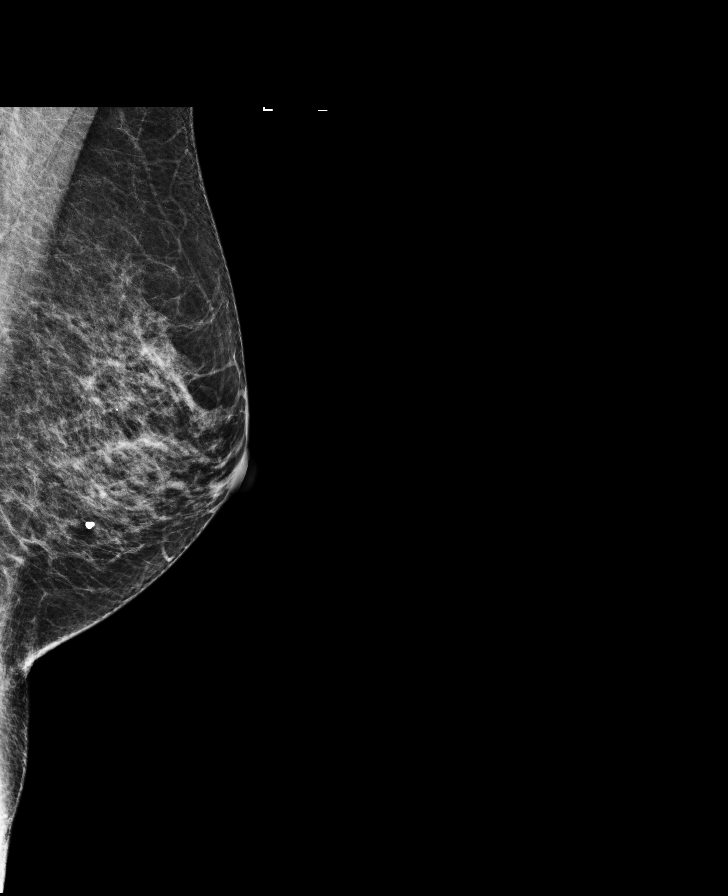

[8 of 28 positions shown; findings below may reference images not displayed]

ACR Breast Density Category c: The breast tissue is heterogeneously
dense, which may obscure small masses.
FINDINGS: In the right breast, possible distortion warrants further
evaluation. In the left breast, no findings suspicious for
malignancy. Images were processed with CAD.
IMPRESSION: Further evaluation is suggested for possible distortion in the right
breast.

RECOMMENDATION:
Diagnostic mammogram and possibly ultrasound of the right breast.
(Code:[id number])

The patient will be contacted regarding the findings, and additional
imaging will be scheduled.

BI-RADS CATEGORY  0: Incomplete. Need additional imaging evaluation
and/or prior mammograms for comparison.

## 2019-06-09 DIAGNOSIS — B070 Plantar wart: Secondary | ICD-10-CM | POA: Diagnosis not present

## 2019-06-09 DIAGNOSIS — M722 Plantar fascial fibromatosis: Secondary | ICD-10-CM | POA: Diagnosis not present

## 2019-06-28 DIAGNOSIS — B070 Plantar wart: Secondary | ICD-10-CM | POA: Diagnosis not present

## 2019-06-28 DIAGNOSIS — M722 Plantar fascial fibromatosis: Secondary | ICD-10-CM | POA: Diagnosis not present

## 2019-07-27 DIAGNOSIS — D2262 Melanocytic nevi of left upper limb, including shoulder: Secondary | ICD-10-CM | POA: Diagnosis not present

## 2019-07-27 DIAGNOSIS — D225 Melanocytic nevi of trunk: Secondary | ICD-10-CM | POA: Diagnosis not present

## 2019-07-27 DIAGNOSIS — Z85828 Personal history of other malignant neoplasm of skin: Secondary | ICD-10-CM | POA: Diagnosis not present

## 2019-07-27 DIAGNOSIS — D2261 Melanocytic nevi of right upper limb, including shoulder: Secondary | ICD-10-CM | POA: Diagnosis not present

## 2019-07-27 DIAGNOSIS — L821 Other seborrheic keratosis: Secondary | ICD-10-CM | POA: Diagnosis not present

## 2019-07-27 DIAGNOSIS — M810 Age-related osteoporosis without current pathological fracture: Secondary | ICD-10-CM | POA: Diagnosis not present

## 2019-07-27 DIAGNOSIS — D2271 Melanocytic nevi of right lower limb, including hip: Secondary | ICD-10-CM | POA: Diagnosis not present

## 2019-09-19 ENCOUNTER — Other Ambulatory Visit: Payer: Self-pay | Admitting: Internal Medicine

## 2019-09-19 DIAGNOSIS — Z1231 Encounter for screening mammogram for malignant neoplasm of breast: Secondary | ICD-10-CM

## 2019-10-28 DIAGNOSIS — M810 Age-related osteoporosis without current pathological fracture: Secondary | ICD-10-CM | POA: Diagnosis not present

## 2019-11-04 DIAGNOSIS — H2511 Age-related nuclear cataract, right eye: Secondary | ICD-10-CM | POA: Diagnosis not present

## 2019-11-15 DIAGNOSIS — Z96641 Presence of right artificial hip joint: Secondary | ICD-10-CM | POA: Diagnosis not present

## 2019-11-15 DIAGNOSIS — M7062 Trochanteric bursitis, left hip: Secondary | ICD-10-CM | POA: Diagnosis not present

## 2019-11-15 DIAGNOSIS — M1611 Unilateral primary osteoarthritis, right hip: Secondary | ICD-10-CM | POA: Diagnosis not present

## 2019-12-21 ENCOUNTER — Ambulatory Visit
Admission: RE | Admit: 2019-12-21 | Discharge: 2019-12-21 | Disposition: A | Discharge status: Home / Self Care | Payer: PPO | Source: Ambulatory Visit | Attending: Internal Medicine | Admitting: Internal Medicine

## 2019-12-21 DIAGNOSIS — Z1231 Encounter for screening mammogram for malignant neoplasm of breast: Secondary | ICD-10-CM

## 2020-02-29 DIAGNOSIS — K2100 Gastro-esophageal reflux disease with esophagitis, without bleeding: Secondary | ICD-10-CM | POA: Diagnosis not present

## 2020-02-29 DIAGNOSIS — K228 Other specified diseases of esophagus: Secondary | ICD-10-CM | POA: Diagnosis not present

## 2020-03-19 ENCOUNTER — Other Ambulatory Visit: Payer: Self-pay | Admitting: Student

## 2020-03-19 DIAGNOSIS — K2289 Other specified disease of esophagus: Secondary | ICD-10-CM

## 2020-03-19 DIAGNOSIS — K2100 Gastro-esophageal reflux disease with esophagitis, without bleeding: Secondary | ICD-10-CM

## 2020-03-28 DIAGNOSIS — E559 Vitamin D deficiency, unspecified: Secondary | ICD-10-CM | POA: Diagnosis not present

## 2020-03-28 DIAGNOSIS — M810 Age-related osteoporosis without current pathological fracture: Secondary | ICD-10-CM | POA: Diagnosis not present

## 2020-04-02 ENCOUNTER — Ambulatory Visit
Admission: RE | Admit: 2020-04-02 | Discharge: 2020-04-02 | Disposition: A | Discharge status: Home / Self Care | Payer: PPO | Source: Ambulatory Visit | Attending: Student | Admitting: Student

## 2020-04-02 ENCOUNTER — Other Ambulatory Visit: Payer: Self-pay

## 2020-04-02 DIAGNOSIS — K228 Other specified diseases of esophagus: Secondary | ICD-10-CM | POA: Diagnosis not present

## 2020-04-02 DIAGNOSIS — K2100 Gastro-esophageal reflux disease with esophagitis, without bleeding: Secondary | ICD-10-CM

## 2020-04-02 DIAGNOSIS — K2289 Other specified disease of esophagus: Secondary | ICD-10-CM

## 2020-05-21 DIAGNOSIS — M818 Other osteoporosis without current pathological fracture: Secondary | ICD-10-CM | POA: Diagnosis not present

## 2020-05-21 DIAGNOSIS — E782 Mixed hyperlipidemia: Secondary | ICD-10-CM | POA: Diagnosis not present

## 2020-05-22 DIAGNOSIS — Z01812 Encounter for preprocedural laboratory examination: Secondary | ICD-10-CM | POA: Diagnosis not present

## 2020-05-24 DIAGNOSIS — K222 Esophageal obstruction: Secondary | ICD-10-CM | POA: Diagnosis not present

## 2020-05-24 DIAGNOSIS — K209 Esophagitis, unspecified without bleeding: Secondary | ICD-10-CM | POA: Diagnosis not present

## 2020-05-24 DIAGNOSIS — K449 Diaphragmatic hernia without obstruction or gangrene: Secondary | ICD-10-CM | POA: Diagnosis not present

## 2020-05-24 DIAGNOSIS — K219 Gastro-esophageal reflux disease without esophagitis: Secondary | ICD-10-CM | POA: Diagnosis not present

## 2020-05-24 HISTORY — PX: ESOPHAGOGASTRODUODENOSCOPY: SHX1529

## 2020-05-30 DIAGNOSIS — E782 Mixed hyperlipidemia: Secondary | ICD-10-CM | POA: Diagnosis not present

## 2020-05-30 DIAGNOSIS — Z0000 Encounter for general adult medical examination without abnormal findings: Secondary | ICD-10-CM | POA: Diagnosis not present

## 2020-07-04 DIAGNOSIS — M8588 Other specified disorders of bone density and structure, other site: Secondary | ICD-10-CM | POA: Diagnosis not present

## 2020-07-26 DIAGNOSIS — X32XXXA Exposure to sunlight, initial encounter: Secondary | ICD-10-CM | POA: Diagnosis not present

## 2020-07-26 DIAGNOSIS — D2271 Melanocytic nevi of right lower limb, including hip: Secondary | ICD-10-CM | POA: Diagnosis not present

## 2020-07-26 DIAGNOSIS — D2262 Melanocytic nevi of left upper limb, including shoulder: Secondary | ICD-10-CM | POA: Diagnosis not present

## 2020-07-26 DIAGNOSIS — Z85828 Personal history of other malignant neoplasm of skin: Secondary | ICD-10-CM | POA: Diagnosis not present

## 2020-07-26 DIAGNOSIS — D2261 Melanocytic nevi of right upper limb, including shoulder: Secondary | ICD-10-CM | POA: Diagnosis not present

## 2020-07-26 DIAGNOSIS — L570 Actinic keratosis: Secondary | ICD-10-CM | POA: Diagnosis not present

## 2020-07-26 DIAGNOSIS — L648 Other androgenic alopecia: Secondary | ICD-10-CM | POA: Diagnosis not present

## 2020-07-26 DIAGNOSIS — D225 Melanocytic nevi of trunk: Secondary | ICD-10-CM | POA: Diagnosis not present

## 2020-08-17 DIAGNOSIS — M7062 Trochanteric bursitis, left hip: Secondary | ICD-10-CM | POA: Diagnosis not present

## 2020-08-17 DIAGNOSIS — M1612 Unilateral primary osteoarthritis, left hip: Secondary | ICD-10-CM | POA: Diagnosis not present

## 2020-08-17 DIAGNOSIS — M5432 Sciatica, left side: Secondary | ICD-10-CM | POA: Diagnosis not present

## 2020-09-26 DIAGNOSIS — E559 Vitamin D deficiency, unspecified: Secondary | ICD-10-CM | POA: Diagnosis not present

## 2020-09-26 DIAGNOSIS — M810 Age-related osteoporosis without current pathological fracture: Secondary | ICD-10-CM | POA: Diagnosis not present

## 2020-10-03 DIAGNOSIS — M810 Age-related osteoporosis without current pathological fracture: Secondary | ICD-10-CM | POA: Diagnosis not present

## 2020-10-04 DIAGNOSIS — B070 Plantar wart: Secondary | ICD-10-CM | POA: Diagnosis not present

## 2020-10-18 DIAGNOSIS — B070 Plantar wart: Secondary | ICD-10-CM | POA: Diagnosis not present

## 2020-11-01 DIAGNOSIS — M810 Age-related osteoporosis without current pathological fracture: Secondary | ICD-10-CM | POA: Diagnosis not present

## 2020-11-05 DIAGNOSIS — H2511 Age-related nuclear cataract, right eye: Secondary | ICD-10-CM | POA: Diagnosis not present

## 2020-11-15 DIAGNOSIS — Z96641 Presence of right artificial hip joint: Secondary | ICD-10-CM | POA: Diagnosis not present

## 2020-11-28 ENCOUNTER — Other Ambulatory Visit: Payer: Self-pay | Admitting: Internal Medicine

## 2020-11-28 DIAGNOSIS — Z1231 Encounter for screening mammogram for malignant neoplasm of breast: Secondary | ICD-10-CM

## 2021-01-01 ENCOUNTER — Ambulatory Visit
Admission: RE | Admit: 2021-01-01 | Discharge: 2021-01-01 | Disposition: A | Discharge status: Home / Self Care | Payer: PPO | Source: Ambulatory Visit | Attending: Internal Medicine | Admitting: Internal Medicine

## 2021-01-01 ENCOUNTER — Other Ambulatory Visit: Payer: Self-pay

## 2021-01-01 DIAGNOSIS — Z1231 Encounter for screening mammogram for malignant neoplasm of breast: Secondary | ICD-10-CM | POA: Diagnosis not present

## 2021-03-25 DIAGNOSIS — M5442 Lumbago with sciatica, left side: Secondary | ICD-10-CM | POA: Diagnosis not present

## 2021-03-25 DIAGNOSIS — M25552 Pain in left hip: Secondary | ICD-10-CM | POA: Diagnosis not present

## 2021-03-25 DIAGNOSIS — M5136 Other intervertebral disc degeneration, lumbar region: Secondary | ICD-10-CM | POA: Diagnosis not present

## 2021-03-25 DIAGNOSIS — M76892 Other specified enthesopathies of left lower limb, excluding foot: Secondary | ICD-10-CM | POA: Diagnosis not present

## 2021-03-25 DIAGNOSIS — M47816 Spondylosis without myelopathy or radiculopathy, lumbar region: Secondary | ICD-10-CM | POA: Diagnosis not present

## 2021-03-25 DIAGNOSIS — M1612 Unilateral primary osteoarthritis, left hip: Secondary | ICD-10-CM | POA: Diagnosis not present

## 2021-03-25 DIAGNOSIS — G8929 Other chronic pain: Secondary | ICD-10-CM | POA: Diagnosis not present

## 2021-03-25 DIAGNOSIS — M7062 Trochanteric bursitis, left hip: Secondary | ICD-10-CM | POA: Diagnosis not present

## 2021-04-02 ENCOUNTER — Other Ambulatory Visit: Payer: Self-pay | Admitting: Sports Medicine

## 2021-04-02 DIAGNOSIS — G8929 Other chronic pain: Secondary | ICD-10-CM

## 2021-04-02 DIAGNOSIS — M5136 Other intervertebral disc degeneration, lumbar region: Secondary | ICD-10-CM

## 2021-04-02 DIAGNOSIS — M47816 Spondylosis without myelopathy or radiculopathy, lumbar region: Secondary | ICD-10-CM

## 2021-04-09 ENCOUNTER — Ambulatory Visit
Admission: RE | Admit: 2021-04-09 | Discharge: 2021-04-09 | Disposition: A | Discharge status: Home / Self Care | Payer: PPO | Source: Ambulatory Visit | Attending: Sports Medicine | Admitting: Sports Medicine

## 2021-04-09 ENCOUNTER — Other Ambulatory Visit: Payer: Self-pay

## 2021-04-09 DIAGNOSIS — M5442 Lumbago with sciatica, left side: Secondary | ICD-10-CM | POA: Insufficient documentation

## 2021-04-09 DIAGNOSIS — M47816 Spondylosis without myelopathy or radiculopathy, lumbar region: Secondary | ICD-10-CM | POA: Diagnosis not present

## 2021-04-09 DIAGNOSIS — G8929 Other chronic pain: Secondary | ICD-10-CM

## 2021-04-09 DIAGNOSIS — M5136 Other intervertebral disc degeneration, lumbar region: Secondary | ICD-10-CM | POA: Diagnosis not present

## 2021-04-09 DIAGNOSIS — M545 Low back pain, unspecified: Secondary | ICD-10-CM | POA: Diagnosis not present

## 2021-04-25 DIAGNOSIS — B070 Plantar wart: Secondary | ICD-10-CM | POA: Diagnosis not present

## 2021-05-03 DIAGNOSIS — M5416 Radiculopathy, lumbar region: Secondary | ICD-10-CM | POA: Diagnosis not present

## 2021-05-03 DIAGNOSIS — M48062 Spinal stenosis, lumbar region with neurogenic claudication: Secondary | ICD-10-CM | POA: Diagnosis not present

## 2021-05-23 DIAGNOSIS — E538 Deficiency of other specified B group vitamins: Secondary | ICD-10-CM | POA: Diagnosis not present

## 2021-05-23 DIAGNOSIS — E782 Mixed hyperlipidemia: Secondary | ICD-10-CM | POA: Diagnosis not present

## 2021-05-31 DIAGNOSIS — E782 Mixed hyperlipidemia: Secondary | ICD-10-CM | POA: Diagnosis not present

## 2021-05-31 DIAGNOSIS — M818 Other osteoporosis without current pathological fracture: Secondary | ICD-10-CM | POA: Diagnosis not present

## 2021-05-31 DIAGNOSIS — Z0000 Encounter for general adult medical examination without abnormal findings: Secondary | ICD-10-CM | POA: Diagnosis not present

## 2021-05-31 DIAGNOSIS — Z23 Encounter for immunization: Secondary | ICD-10-CM | POA: Diagnosis not present

## 2021-05-31 DIAGNOSIS — L237 Allergic contact dermatitis due to plants, except food: Secondary | ICD-10-CM | POA: Diagnosis not present

## 2021-06-07 DIAGNOSIS — M48062 Spinal stenosis, lumbar region with neurogenic claudication: Secondary | ICD-10-CM | POA: Diagnosis not present

## 2021-06-07 DIAGNOSIS — M5136 Other intervertebral disc degeneration, lumbar region: Secondary | ICD-10-CM | POA: Diagnosis not present

## 2021-06-07 DIAGNOSIS — M5416 Radiculopathy, lumbar region: Secondary | ICD-10-CM | POA: Diagnosis not present

## 2021-06-10 DIAGNOSIS — M48062 Spinal stenosis, lumbar region with neurogenic claudication: Secondary | ICD-10-CM | POA: Diagnosis not present

## 2021-06-10 DIAGNOSIS — M5416 Radiculopathy, lumbar region: Secondary | ICD-10-CM | POA: Diagnosis not present

## 2021-06-26 DIAGNOSIS — M5416 Radiculopathy, lumbar region: Secondary | ICD-10-CM | POA: Diagnosis not present

## 2021-07-02 DIAGNOSIS — M5416 Radiculopathy, lumbar region: Secondary | ICD-10-CM | POA: Diagnosis not present

## 2021-07-04 DIAGNOSIS — M5416 Radiculopathy, lumbar region: Secondary | ICD-10-CM | POA: Diagnosis not present

## 2021-07-08 DIAGNOSIS — M5416 Radiculopathy, lumbar region: Secondary | ICD-10-CM | POA: Diagnosis not present

## 2021-07-11 DIAGNOSIS — M5416 Radiculopathy, lumbar region: Secondary | ICD-10-CM | POA: Diagnosis not present

## 2021-07-15 DIAGNOSIS — M5416 Radiculopathy, lumbar region: Secondary | ICD-10-CM | POA: Diagnosis not present

## 2021-07-15 DIAGNOSIS — M5136 Other intervertebral disc degeneration, lumbar region: Secondary | ICD-10-CM | POA: Diagnosis not present

## 2021-07-15 DIAGNOSIS — M48062 Spinal stenosis, lumbar region with neurogenic claudication: Secondary | ICD-10-CM | POA: Diagnosis not present

## 2021-07-18 DIAGNOSIS — M5416 Radiculopathy, lumbar region: Secondary | ICD-10-CM | POA: Diagnosis not present

## 2021-07-18 DIAGNOSIS — E041 Nontoxic single thyroid nodule: Secondary | ICD-10-CM | POA: Diagnosis not present

## 2021-07-18 DIAGNOSIS — R590 Localized enlarged lymph nodes: Secondary | ICD-10-CM | POA: Diagnosis not present

## 2021-07-23 DIAGNOSIS — M5416 Radiculopathy, lumbar region: Secondary | ICD-10-CM | POA: Diagnosis not present

## 2021-07-25 DIAGNOSIS — M5416 Radiculopathy, lumbar region: Secondary | ICD-10-CM | POA: Diagnosis not present

## 2021-07-26 DIAGNOSIS — E041 Nontoxic single thyroid nodule: Secondary | ICD-10-CM | POA: Diagnosis not present

## 2021-07-29 DIAGNOSIS — M5416 Radiculopathy, lumbar region: Secondary | ICD-10-CM | POA: Diagnosis not present

## 2021-07-30 DIAGNOSIS — D2271 Melanocytic nevi of right lower limb, including hip: Secondary | ICD-10-CM | POA: Diagnosis not present

## 2021-07-30 DIAGNOSIS — L570 Actinic keratosis: Secondary | ICD-10-CM | POA: Diagnosis not present

## 2021-07-30 DIAGNOSIS — B070 Plantar wart: Secondary | ICD-10-CM | POA: Diagnosis not present

## 2021-07-30 DIAGNOSIS — L309 Dermatitis, unspecified: Secondary | ICD-10-CM | POA: Diagnosis not present

## 2021-07-30 DIAGNOSIS — M79672 Pain in left foot: Secondary | ICD-10-CM | POA: Diagnosis not present

## 2021-07-30 DIAGNOSIS — M79671 Pain in right foot: Secondary | ICD-10-CM | POA: Diagnosis not present

## 2021-07-30 DIAGNOSIS — D2261 Melanocytic nevi of right upper limb, including shoulder: Secondary | ICD-10-CM | POA: Diagnosis not present

## 2021-07-30 DIAGNOSIS — L821 Other seborrheic keratosis: Secondary | ICD-10-CM | POA: Diagnosis not present

## 2021-07-30 DIAGNOSIS — X32XXXA Exposure to sunlight, initial encounter: Secondary | ICD-10-CM | POA: Diagnosis not present

## 2021-07-30 DIAGNOSIS — Z85828 Personal history of other malignant neoplasm of skin: Secondary | ICD-10-CM | POA: Diagnosis not present

## 2021-08-06 DIAGNOSIS — M5416 Radiculopathy, lumbar region: Secondary | ICD-10-CM | POA: Diagnosis not present

## 2021-08-08 DIAGNOSIS — M5416 Radiculopathy, lumbar region: Secondary | ICD-10-CM | POA: Diagnosis not present

## 2021-08-20 DIAGNOSIS — M5416 Radiculopathy, lumbar region: Secondary | ICD-10-CM | POA: Diagnosis not present

## 2021-09-05 DIAGNOSIS — M5416 Radiculopathy, lumbar region: Secondary | ICD-10-CM | POA: Diagnosis not present

## 2021-09-13 DIAGNOSIS — M5416 Radiculopathy, lumbar region: Secondary | ICD-10-CM | POA: Diagnosis not present

## 2021-09-18 DIAGNOSIS — M5416 Radiculopathy, lumbar region: Secondary | ICD-10-CM | POA: Diagnosis not present

## 2021-09-20 DIAGNOSIS — M5416 Radiculopathy, lumbar region: Secondary | ICD-10-CM | POA: Diagnosis not present

## 2021-09-20 DIAGNOSIS — M5136 Other intervertebral disc degeneration, lumbar region: Secondary | ICD-10-CM | POA: Diagnosis not present

## 2021-09-26 DIAGNOSIS — M5416 Radiculopathy, lumbar region: Secondary | ICD-10-CM | POA: Diagnosis not present

## 2021-09-27 DIAGNOSIS — M810 Age-related osteoporosis without current pathological fracture: Secondary | ICD-10-CM | POA: Diagnosis not present

## 2021-09-30 DIAGNOSIS — M810 Age-related osteoporosis without current pathological fracture: Secondary | ICD-10-CM | POA: Diagnosis not present

## 2021-09-30 DIAGNOSIS — E042 Nontoxic multinodular goiter: Secondary | ICD-10-CM | POA: Diagnosis not present

## 2021-10-01 DIAGNOSIS — M5416 Radiculopathy, lumbar region: Secondary | ICD-10-CM | POA: Diagnosis not present

## 2021-10-02 DIAGNOSIS — M48062 Spinal stenosis, lumbar region with neurogenic claudication: Secondary | ICD-10-CM | POA: Diagnosis not present

## 2021-10-02 DIAGNOSIS — K2100 Gastro-esophageal reflux disease with esophagitis, without bleeding: Secondary | ICD-10-CM | POA: Diagnosis not present

## 2021-10-02 DIAGNOSIS — M5416 Radiculopathy, lumbar region: Secondary | ICD-10-CM | POA: Diagnosis not present

## 2021-10-08 DIAGNOSIS — E042 Nontoxic multinodular goiter: Secondary | ICD-10-CM | POA: Diagnosis not present

## 2021-10-09 DIAGNOSIS — M5416 Radiculopathy, lumbar region: Secondary | ICD-10-CM | POA: Diagnosis not present

## 2021-10-15 DIAGNOSIS — E042 Nontoxic multinodular goiter: Secondary | ICD-10-CM | POA: Diagnosis not present

## 2021-10-17 DIAGNOSIS — M5416 Radiculopathy, lumbar region: Secondary | ICD-10-CM | POA: Diagnosis not present

## 2021-11-11 ENCOUNTER — Other Ambulatory Visit: Payer: Self-pay | Admitting: Orthopedic Surgery

## 2021-11-11 DIAGNOSIS — S62617A Displaced fracture of proximal phalanx of left little finger, initial encounter for closed fracture: Secondary | ICD-10-CM | POA: Diagnosis not present

## 2021-11-12 ENCOUNTER — Other Ambulatory Visit: Payer: Self-pay

## 2021-11-12 ENCOUNTER — Encounter
Admission: RE | Admit: 2021-11-12 | Discharge: 2021-11-12 | Disposition: A | Discharge status: Home / Self Care | Payer: PPO | Source: Ambulatory Visit | Attending: Orthopedic Surgery | Admitting: Orthopedic Surgery

## 2021-11-12 VITALS — Ht 63.0 in | Wt 136.0 lb

## 2021-11-12 DIAGNOSIS — I10 Essential (primary) hypertension: Secondary | ICD-10-CM

## 2021-11-12 DIAGNOSIS — Z01812 Encounter for preprocedural laboratory examination: Secondary | ICD-10-CM

## 2021-11-12 HISTORY — DX: Cortical age-related cataract, left eye: H25.012

## 2021-11-12 HISTORY — DX: Age-related osteoporosis without current pathological fracture: M81.0

## 2021-11-12 HISTORY — DX: Diverticulosis of intestine, part unspecified, without perforation or abscess without bleeding: K57.90

## 2021-11-12 HISTORY — DX: Nontoxic single thyroid nodule: E04.1

## 2021-11-12 HISTORY — DX: Other cervical disc degeneration, unspecified cervical region: M50.30

## 2021-11-12 HISTORY — DX: Hyperlipidemia, unspecified: E78.5

## 2021-11-12 NOTE — Patient Instructions (Addendum)
Your procedure is scheduled on: Thursday, March 9 ?Report to the Registration Desk on the 1st floor of the [facility]. ?To find out your arrival time, please call [phone] between 1PM - 3PM on: Wednesday, March 8 ? ?REMEMBER: ?Instructions that are not followed completely may result in serious medical risk, up to and including death; or upon the discretion of your surgeon and anesthesiologist your surgery may need to be rescheduled. ? ?Do not eat food after midnight the night before surgery.  ?No gum chewing, lozengers or hard candies. ? ?You may however, drink CLEAR liquids up to 2 hours before you are scheduled to arrive for your surgery. Do not drink anything within 2 hours of your scheduled arrival time. ? ?Clear liquids include: ?- water  ?- apple juice without pulp ?- gatorade (not RED colors) ?- black coffee or tea (Do NOT add milk or creamers to the coffee or tea) ?Do NOT drink anything that is not on this list. ? ?In addition, your doctor has ordered for you to drink the provided  ?Ensure Pre-Surgery Clear Carbohydrate Drink  ?Drinking this carbohydrate drink up to two hours before surgery helps to reduce insulin resistance and improve patient outcomes. Please complete drinking 2 hours prior to scheduled arrival time. ? ?TAKE THESE MEDICATIONS THE MORNING OF SURGERY WITH A SIP OF WATER: ? ?Minoxidil ?Pantoprazole (Protonix) ? ?One week prior to surgery: ?Stop Anti-inflammatories (NSAIDS) such as Advil, Aleve, Ibuprofen, Motrin, Naproxen, Naprosyn and Aspirin based products such as Excedrin, Goodys Powder, BC Powder. ?Stop ANY OVER THE COUNTER supplements until after surgery. ?You may however, continue to take Tylenol if needed for pain up until the day of surgery. ? ?No Alcohol for 24 hours before or after surgery. ? ?No Smoking including e-cigarettes for 24 hours prior to surgery.  ?No chewable tobacco products for at least 6 hours prior to surgery.  ?No nicotine patches on the day of  surgery. ? ?Do not use any "recreational" drugs for at least a week prior to your surgery.  ?Please be advised that the combination of cocaine and anesthesia may have negative outcomes, up to and including death. ?If you test positive for cocaine, your surgery will be cancelled. ? ?On the morning of surgery brush your teeth with toothpaste and water, you may rinse your mouth with mouthwash if you wish. ?Do not swallow any toothpaste or mouthwash. ? ?Use CHG Soap or wipes as directed on instruction sheet. ? ?Do not wear jewelry, make-up, hairpins, clips or nail polish. ? ?Do not wear lotions, powders, or perfumes.  ? ?Do not shave body from the neck down 48 hours prior to surgery just in case you cut yourself which could leave a site for infection.  ?Also, freshly shaved skin may become irritated if using the CHG soap. ? ?Contact lenses, hearing aids and dentures may not be worn into surgery. ? ?Do not bring valuables to the hospital. [facility] is not responsible for any missing/lost belongings or valuables.  ? ?Notify your doctor if there is any change in your medical condition (cold, fever, infection). ? ?Wear comfortable clothing (specific to your surgery type) to the hospital. ? ?After surgery, you can help prevent lung complications by doing breathing exercises.  ?Take deep breaths and cough every 1-2 hours. Your doctor may order a device called an Incentive Spirometer to help you take deep breaths. ? ?If you are being discharged the day of surgery, you will not be allowed to drive home. ?You will need  a responsible adult (18 years or older) to drive you home and stay with you that night.  ? ?If you are taking public transportation, you will need to have a responsible adult (18 years or older) with you. ?Please confirm with your physician that it is acceptable to use public transportation.  ? ?Please call the [facility] at [phone] if you have any questions about these  instructions. ? ?Surgery Visitation Policy: ? ?Patients undergoing a surgery or procedure may have one family member or support person with them as long as that person is not COVID-19 positive or experiencing its symptoms.  ?That person may remain in the waiting area during the procedure and may rotate out with other people. ?

## 2021-11-13 ENCOUNTER — Encounter: Payer: Self-pay | Admitting: Urgent Care

## 2021-11-13 ENCOUNTER — Encounter
Admission: RE | Admit: 2021-11-13 | Discharge: 2021-11-13 | Disposition: A | Discharge status: Home / Self Care | Payer: PPO | Source: Ambulatory Visit | Attending: Orthopedic Surgery | Admitting: Orthopedic Surgery

## 2021-11-13 DIAGNOSIS — Z01818 Encounter for other preprocedural examination: Secondary | ICD-10-CM | POA: Diagnosis not present

## 2021-11-13 DIAGNOSIS — I10 Essential (primary) hypertension: Secondary | ICD-10-CM

## 2021-11-13 DIAGNOSIS — Z01812 Encounter for preprocedural laboratory examination: Secondary | ICD-10-CM

## 2021-11-13 DIAGNOSIS — M810 Age-related osteoporosis without current pathological fracture: Secondary | ICD-10-CM | POA: Diagnosis not present

## 2021-11-13 LAB — POTASSIUM: Potassium: 3.9 mmol/L (ref 3.5–5.1)

## 2021-11-14 ENCOUNTER — Ambulatory Visit: Payer: PPO

## 2021-11-14 ENCOUNTER — Ambulatory Visit
Admission: RE | Admit: 2021-11-14 | Discharge: 2021-11-14 | Disposition: A | Discharge status: Home / Self Care | Payer: PPO | Attending: Orthopedic Surgery | Admitting: Orthopedic Surgery

## 2021-11-14 ENCOUNTER — Ambulatory Visit: Payer: PPO | Admitting: Urgent Care

## 2021-11-14 ENCOUNTER — Other Ambulatory Visit: Payer: Self-pay

## 2021-11-14 ENCOUNTER — Encounter
Admission: RE | Disposition: A | Discharge status: Home / Self Care | Payer: Self-pay | Source: Home / Self Care | Attending: Orthopedic Surgery

## 2021-11-14 ENCOUNTER — Ambulatory Visit: Payer: PPO | Admitting: Certified Registered"

## 2021-11-14 ENCOUNTER — Encounter: Payer: Self-pay | Admitting: Orthopedic Surgery

## 2021-11-14 DIAGNOSIS — W1789XA Other fall from one level to another, initial encounter: Secondary | ICD-10-CM | POA: Insufficient documentation

## 2021-11-14 DIAGNOSIS — K219 Gastro-esophageal reflux disease without esophagitis: Secondary | ICD-10-CM | POA: Insufficient documentation

## 2021-11-14 DIAGNOSIS — Z96641 Presence of right artificial hip joint: Secondary | ICD-10-CM | POA: Insufficient documentation

## 2021-11-14 DIAGNOSIS — S62617A Displaced fracture of proximal phalanx of left little finger, initial encounter for closed fracture: Secondary | ICD-10-CM | POA: Insufficient documentation

## 2021-11-14 DIAGNOSIS — I10 Essential (primary) hypertension: Secondary | ICD-10-CM | POA: Insufficient documentation

## 2021-11-14 HISTORY — PX: OPEN REDUCTION INTERNAL FIXATION (ORIF) PROXIMAL PHALANX: SHX6235

## 2021-11-14 SURGERY — OPEN REDUCTION INTERNAL FIXATION (ORIF) PROXIMAL PHALANX
Anesthesia: General | Site: Finger | Laterality: Left

## 2021-11-14 MED ORDER — SODIUM CHLORIDE 0.9 % IV SOLN: INTRAVENOUS | Status: DC

## 2021-11-14 MED ORDER — ONDANSETRON HCL 4 MG/2ML IJ SOLN: 4.0000 mg | Freq: Four times a day (QID) | INTRAMUSCULAR | Status: DC | PRN

## 2021-11-14 MED ORDER — ONDANSETRON HCL 4 MG/2ML IJ SOLN
INTRAMUSCULAR | Status: AC
  Filled 2021-11-14: qty 2

## 2021-11-14 MED ORDER — CEFAZOLIN SODIUM-DEXTROSE 2-4 GM/100ML-% IV SOLN
2.0000 g | INTRAVENOUS | Status: AC
  Administered 2021-11-14: 12:00:00 2 g via INTRAVENOUS

## 2021-11-14 MED ORDER — CHLORHEXIDINE GLUCONATE 0.12 % MT SOLN
OROMUCOSAL | Status: AC
  Administered 2021-11-14: 10:00:00 15 mL via OROMUCOSAL
  Filled 2021-11-14: qty 15

## 2021-11-14 MED ORDER — GLYCOPYRROLATE 0.2 MG/ML IJ SOLN
INTRAMUSCULAR | Status: AC
  Filled 2021-11-14: qty 1

## 2021-11-14 MED ORDER — FENTANYL CITRATE (PF) 100 MCG/2ML IJ SOLN
INTRAMUSCULAR | Status: AC
Start: 2021-11-14 — End: ?
  Filled 2021-11-14: qty 2

## 2021-11-14 MED ORDER — OXYCODONE HCL 5 MG/5ML PO SOLN: 5.0000 mg | Freq: Once | ORAL | Status: DC | PRN

## 2021-11-14 MED ORDER — METOCLOPRAMIDE HCL 10 MG PO TABS: 5.0000 mg | ORAL_TABLET | Freq: Three times a day (TID) | ORAL | Status: DC | PRN

## 2021-11-14 MED ORDER — EPHEDRINE 5 MG/ML INJ
INTRAVENOUS | Status: AC
  Filled 2021-11-14: qty 5

## 2021-11-14 MED ORDER — ONDANSETRON HCL 4 MG PO TABS: 4.0000 mg | ORAL_TABLET | Freq: Four times a day (QID) | ORAL | Status: DC | PRN

## 2021-11-14 MED ORDER — DEXMEDETOMIDINE (PRECEDEX) IN NS 20 MCG/5ML (4 MCG/ML) IV SYRINGE
PREFILLED_SYRINGE | INTRAVENOUS | Status: DC | PRN
  Administered 2021-11-14: 12 ug via INTRAVENOUS

## 2021-11-14 MED ORDER — FENTANYL CITRATE (PF) 100 MCG/2ML IJ SOLN
INTRAMUSCULAR | Status: DC | PRN
  Administered 2021-11-14: 50 ug via INTRAVENOUS
  Administered 2021-11-14 (×2): 25 ug via INTRAVENOUS

## 2021-11-14 MED ORDER — DEXAMETHASONE SODIUM PHOSPHATE 10 MG/ML IJ SOLN
INTRAMUSCULAR | Status: DC | PRN
  Administered 2021-11-14: 10 mg via INTRAVENOUS

## 2021-11-14 MED ORDER — EPHEDRINE SULFATE (PRESSORS) 50 MG/ML IJ SOLN
INTRAMUSCULAR | Status: DC | PRN
  Administered 2021-11-14: 5 mg via INTRAVENOUS
  Administered 2021-11-14: 10 mg via INTRAVENOUS

## 2021-11-14 MED ORDER — METOCLOPRAMIDE HCL 5 MG/ML IJ SOLN: 5.0000 mg | Freq: Three times a day (TID) | INTRAMUSCULAR | Status: DC | PRN

## 2021-11-14 MED ORDER — CHLORHEXIDINE GLUCONATE 0.12 % MT SOLN
15.0000 mL | Freq: Once | OROMUCOSAL | Status: AC
Start: 2021-11-14 — End: 2021-11-14

## 2021-11-14 MED ORDER — LIDOCAINE HCL (PF) 2 % IJ SOLN
INTRAMUSCULAR | Status: AC
  Filled 2021-11-14: qty 5

## 2021-11-14 MED ORDER — ONDANSETRON HCL 4 MG/2ML IJ SOLN: 4.0000 mg | Freq: Once | INTRAMUSCULAR | Status: DC

## 2021-11-14 MED ORDER — ONDANSETRON HCL 4 MG/2ML IJ SOLN
INTRAMUSCULAR | Status: DC | PRN
  Administered 2021-11-14: 4 mg via INTRAVENOUS

## 2021-11-14 MED ORDER — LACTATED RINGERS IV SOLN: INTRAVENOUS | Status: DC

## 2021-11-14 MED ORDER — ACETAMINOPHEN 10 MG/ML IV SOLN
INTRAVENOUS | Status: AC
  Filled 2021-11-14: qty 100

## 2021-11-14 MED ORDER — LIDOCAINE HCL (PF) 2 % IJ SOLN
INTRAMUSCULAR | Status: DC | PRN
  Administered 2021-11-14: 50 mg

## 2021-11-14 MED ORDER — MIDAZOLAM HCL 5 MG/5ML IJ SOLN
INTRAMUSCULAR | Status: DC | PRN
  Administered 2021-11-14: 2 mg via INTRAVENOUS

## 2021-11-14 MED ORDER — FENTANYL CITRATE (PF) 100 MCG/2ML IJ SOLN
INTRAMUSCULAR | Status: AC
  Filled 2021-11-14: qty 2

## 2021-11-14 MED ORDER — ORAL CARE MOUTH RINSE: 15.0000 mL | Freq: Once | OROMUCOSAL | Status: AC

## 2021-11-14 MED ORDER — CEFAZOLIN SODIUM-DEXTROSE 2-4 GM/100ML-% IV SOLN
INTRAVENOUS | Status: AC
  Filled 2021-11-14: qty 100

## 2021-11-14 MED ORDER — OXYCODONE HCL 5 MG PO TABS: 5.0000 mg | ORAL_TABLET | Freq: Once | ORAL | Status: DC | PRN

## 2021-11-14 MED ORDER — BUPIVACAINE HCL (PF) 0.5 % IJ SOLN
INTRAMUSCULAR | Status: DC | PRN
  Administered 2021-11-14: 10 mL

## 2021-11-14 MED ORDER — BUPIVACAINE HCL (PF) 0.5 % IJ SOLN
INTRAMUSCULAR | Status: AC
  Filled 2021-11-14: qty 30

## 2021-11-14 MED ORDER — HYDROCODONE-ACETAMINOPHEN 7.5-325 MG PO TABS
1.0000 | ORAL_TABLET | Freq: Four times a day (QID) | ORAL | 0 refills | Status: DC | PRN
Start: 2021-11-14 — End: 2022-07-30

## 2021-11-14 MED ORDER — 0.9 % SODIUM CHLORIDE (POUR BTL) OPTIME
TOPICAL | Status: DC | PRN
  Administered 2021-11-14: 12:00:00 500 mL

## 2021-11-14 MED ORDER — GLYCOPYRROLATE 0.2 MG/ML IJ SOLN
INTRAMUSCULAR | Status: DC | PRN
  Administered 2021-11-14: .2 mg via INTRAVENOUS

## 2021-11-14 MED ORDER — FENTANYL CITRATE (PF) 100 MCG/2ML IJ SOLN: 25.0000 ug | INTRAMUSCULAR | Status: DC | PRN

## 2021-11-14 MED ORDER — MIDAZOLAM HCL 2 MG/2ML IJ SOLN
INTRAMUSCULAR | Status: AC
  Filled 2021-11-14: qty 2

## 2021-11-14 MED ORDER — DEXAMETHASONE SODIUM PHOSPHATE 10 MG/ML IJ SOLN
INTRAMUSCULAR | Status: AC
  Filled 2021-11-14: qty 1

## 2021-11-14 MED ORDER — OXYCODONE HCL 5 MG PO TABS
ORAL_TABLET | ORAL | Status: AC
  Filled 2021-11-14: qty 1

## 2021-11-14 MED ORDER — PROPOFOL 10 MG/ML IV BOLUS
INTRAVENOUS | Status: DC | PRN
  Administered 2021-11-14: 150 mg via INTRAVENOUS

## 2021-11-14 SURGICAL SUPPLY — 37 items
APL PRP STRL LF DISP 70% ISPRP (MISCELLANEOUS) ×1
BIT DRILL 1.1X60MM (BIT) IMPLANT
BNDG GAUZE 1X2.1 STRL (MISCELLANEOUS) ×3 IMPLANT
CHLORAPREP W/TINT 26 (MISCELLANEOUS) ×3 IMPLANT
CUFF TOURN SGL QUICK 18X4 (TOURNIQUET CUFF) IMPLANT
DRAPE FLUOR MINI C-ARM 54X84 (DRAPES) ×3 IMPLANT
DRILL 1.1X60MM (BIT) ×3
DRIVER BIT 1.5 (TRAUMA) ×2 IMPLANT
ELECT CAUTERY BLADE 6.4 (BLADE) ×3 IMPLANT
ELECT REM PT RETURN 9FT ADLT (ELECTROSURGICAL) ×3
ELECTRODE REM PT RTRN 9FT ADLT (ELECTROSURGICAL) ×1 IMPLANT
GAUZE SPONGE 4X4 12PLY STRL (GAUZE/BANDAGES/DRESSINGS) ×3 IMPLANT
GLOVE SURG SYN 9.0  PF PI (GLOVE) ×2
GLOVE SURG SYN 9.0 PF PI (GLOVE) ×1 IMPLANT
GLOVE SURG UNDER POLY LF SZ9 (GLOVE) ×3 IMPLANT
GOWN SRG 2XL LVL 4 RGLN SLV (GOWNS) ×1 IMPLANT
GOWN STRL NON-REIN 2XL LVL4 (GOWNS) ×3
GOWN STRL REUS W/ TWL LRG LVL3 (GOWN DISPOSABLE) ×1 IMPLANT
GOWN STRL REUS W/TWL LRG LVL3 (GOWN DISPOSABLE) ×3
KIT TURNOVER KIT A (KITS) ×3 IMPLANT
MANIFOLD NEPTUNE II (INSTRUMENTS) ×3 IMPLANT
NDL FILTER BLUNT 18X1 1/2 (NEEDLE) ×1 IMPLANT
NEEDLE FILTER BLUNT 18X 1/2SAF (NEEDLE) ×2
NEEDLE FILTER BLUNT 18X1 1/2 (NEEDLE) ×1 IMPLANT
NS IRRIG 500ML POUR BTL (IV SOLUTION) ×3 IMPLANT
PACK EXTREMITY ARMC (MISCELLANEOUS) ×3 IMPLANT
PAD PREP 24X41 OB/GYN DISP (PERSONAL CARE ITEMS) ×3 IMPLANT
PADDING CAST BLEND 4X4 NS (MISCELLANEOUS) ×3 IMPLANT
PLATE T SMALL 1.5MM (Plate) ×2 IMPLANT
SCALPEL PROTECTED #15 DISP (BLADE) ×6 IMPLANT
SCREW L 1.5X12 (Screw) ×4 IMPLANT
SCREW L 1.5X14 (Screw) ×2 IMPLANT
SCREW LOCKING 1.5X11MM (Screw) ×8 IMPLANT
SCREW NONIOC 1.5 10M (Screw) ×4 IMPLANT
SCREW NONIOC 1.5 14M (Screw) ×2 IMPLANT
SUT ETHILON 5 0 CL P 3 (SUTURE) ×3 IMPLANT
WATER STERILE IRR 500ML POUR (IV SOLUTION) ×3 IMPLANT

## 2021-11-14 NOTE — H&P (Signed)
Chief Complaint  ?Patient presents with  ? Left hand pain  ?[place] out of the car over the weekend  ? ?Christina Foley is a 76 y.o. female who presents today for evaluation of a left hand proximal phalanx fracture. Injury occurred 3 days ago on 11/08/2021. Patient was traveling and unfortunately fell and injured her left hand. Patient had a significant deformity and husband was able to reduce the deformity to a more acceptable position. Patient's pain has been fairly well controlled. No numbness or tingling. Pain is located along the proximal phalanx of the left hand. She is right-hand dominant. She is very active. ? ?Past Medical History: ?Past Medical History:  ?Diagnosis Date  ? Cataract cortical, senile around 2014  ?left eye  ? Chickenpox  ? DDD (degenerative disc disease), cervical  ? Diverticulosis  ? Encounter for blood transfusion 1977  ?following birth of son  ? GERD (gastroesophageal reflux disease)  ? Hyperlipidemia, mixed  ? Hypertension  ? Osteoporosis  ? Seasonal allergies  ? ?Past Surgical History: ?Past Surgical History:  ?Procedure Laterality Date  ? EGD 12/02/1991  ? Right total hip arthroplasty 08/07/2014  ?Dr. Marry Guan  ? Limited arthroscopic debridement,arthroscopic subacromial tendon repair,arthroscopic subacromial decompression,mini open rotator cuff repair and mini-open biceps tendoesis,right shoulder Right 10/28/2017  ?DR.Poggi  ? COLONOSCOPY 03/31/2018  ?Hyperplastic Polyp: CBF 03/2028  ? EGD 04/05/2018  ?Looks close to [employer] Esophagus per RTE: CBF 03/2021  ? EGD 05/24/2020  ?Normal EGD/Repeat as needed/CTL  ? CATARACT EXTRACTION several years ago  ?left eye  ? COLONOSCOPY 10/13/2007, 10/24/2003, 12/02/1991  ?Int Hemorrhoids, Diverticulosis: CBF 10/2017; Recall Ltr mailed 09/17/2017 (dh)  ? Foot surgery  ? JOINT REPLACEMENT 07/2014  ?right hip  ? TUBAL LIGATION Bilateral  ? ?Past Family History: ?Family History  ?Problem Relation Age of Onset  ? Myocardial Infarction (Heart attack) Father  40  ? High blood pressure (Hypertension) Father  ? Thyroid cancer Mother 36  ? High blood pressure (Hypertension) Brother  ? Osteoarthritis Paternal Grandmother  ? Breast cancer Maternal Aunt  ? High blood pressure (Hypertension) Brother  ? ?Medications: ?Current Outpatient Medications Ordered in Epic  ?Medication Sig Dispense Refill  ? acetaminophen (TYLENOL) 500 MG tablet Take 500 mg by mouth once daily. prn ? ? ALPRAZolam (XANAX) 0.25 MG tablet Take 1 tablet (0.25 mg total) by mouth once daily as needed for Anxiety for up to 30 days 30 tablet 1  ? biotin 2,500 mcg Tab Take by mouth once daily.  ? CALCIUM CARBONATE (CALCIUM 500 ORAL) Take 1 tablet by mouth once daily.  ? ? cholecalciferol (VITAMIN D3) 2,000 unit tablet Take 2,000 Units by mouth once daily.  ? ? diphenoxylate-atropine (LOMOTIL) 2.5-0.025 mg tablet Take 1 tablet by mouth once daily as needed for Diarrhea 40 tablet 1  ? EPIPEN 2-PAK 0.3 mg/0.3 mL (1:1,000) pen injector Inject 0.3 mg into the muscle once as needed.  ? ? estradioL (ESTRACE) 0.01 % (0.1 mg/gram) vaginal cream Place 0.5 g vaginally once a week 42.5 g 11  ? famotidine (PEPCID) 20 MG tablet Take 1 tablet (20 mg total) by mouth once daily 90 tablet 3  ? fluticasone propionate (FLONASE) 50 mcg/actuation nasal spray Place 2 sprays into both nostrils once daily as needed for Rhinitis  ? gabapentin (NEURONTIN) 100 MG capsule Take 1 capsule (100 mg total) by mouth 3 (three) times daily (Patient taking differently: Take 100 mg by mouth once daily) 90 capsule 11  ? L. ACIDOPHILUS/PECTIN, CITRUS (ACIDOPHILUS PROBIOTIC ORAL)  Take by mouth once daily.  ? lisinopriL-hydrochlorothiazide (ZESTORETIC) 10-12.5 mg tablet Take 1 tablet by mouth once daily 90 tablet 3  ? loratadine (CLARITIN) 10 mg capsule Take 10 mg by mouth once daily as needed.  ? methocarbamoL (ROBAXIN) 500 MG tablet 1/2-1 po qHS prn 30 tablet 0  ? minoxidiL 2.5 MG tablet Take 1.25 mg by mouth once daily  ? pantoprazole (PROTONIX) 40 MG  DR tablet Take 1 tablet (40 mg total) by mouth 2 (two) times daily Take 1 tablet 30 mins before breakfast and 1 tablet 30 mins before dinner. 180 tablet 3  ? predniSONE (DELTASONE) 10 MG tablet 6 day taper - Take as directed 21 tablet 0  ? sertraline (ZOLOFT) 50 MG tablet Take 1 tablet (50 mg total) by mouth once daily 90 tablet 3  ? zolpidem (AMBIEN) 10 mg tablet Take 1 tablet (10 mg total) by mouth at bedtime as needed for Sleep for up to 30 days 30 tablet 0  ? ?No current Epic-ordered facility-administered medications on file.  ? ?Allergies: ?Allergies  ?Allergen Reactions  ? Fosamax [Alendronate] Other (See Comments)  ?Severe reflux  ? ? ?Review of Systems:  ?A comprehensive 14 point ROS was performed, reviewed by me today, and the pertinent orthopaedic findings are documented in the HPI. ? ?Exam: ?BP 104/62  Ht 157.5 cm ('5\' 2"'$ )  Wt 62.4 kg (137 lb 9.6 oz)  BMI 25.17 kg/m?  ?General:  ?Well developed, well nourished, no apparent distress, normal affect, normal gait with no antalgic component.  ? ?HEENT: ?Head normocephalic, atraumatic, PERRL.  ? ?Abdomen: ?Soft, non tender, non distended, Bowel sounds present. ? ?Heart: ?Examination of the heart reveals regular, rate, and rhythm. There is no murmur noted on ascultation. There is a normal apical pulse. ? ?Lungs: ?Lungs are clear to auscultation. There is no wheeze, rhonchi, or crackles. There is normal expansion of bilateral chest walls.  ? ?Left hand: ?Examination of the left hand shows deformity to the left fifth digit with rotation and angulation. Proximal phalanx is abducted and have valgus deformity. Sensation is intact distally. Good range of motion of the DIP joint. Moderate swelling throughout the fifth digit MCP joint of the left hand ? ?AP lateral oblique views of the left hand are ordered interpreted by me in the office today. Impression: Patient has a transverse proximal phalanx fracture of the left fifth digit with no intra-articular extension.  Fracture is completely offset and displaced with valgus angulation as well as dorsal tilt. ? ?Impression: ?Closed displaced fracture of proximal phalanx of left little finger, initial encounter [S62.617A] ?Closed displaced fracture of proximal phalanx of left little finger, initial encounter (primary encounter diagnosis) ? ?Plan:  ?92. 76 year old female with displaced proximal phalanx fracture of the left fifth digit. X-ray shows significant deformity. Risks, benefits, complications of a left proximal phalanx open reduction internal fixation have been discussed with the patient. Patient has agreed and consented procedure with Dr. Hessie Knows. Surgery will be scheduled for 11/14/2021. Today, patient placed into buddy tape and aluminum foam splint. Pain is well controlled, she will continue with Tylenol. ? ?This note was generated in part with voice recognition software and I apologize for any typographical errors that were not detected and corrected. ? ?Feliberto Gottron MPA-C  ? ?Electronically signed by Feliberto Gottron, PA at 11/11/2021 12:35 PM EST ? ?Reviewed  H+P. ?No changes noted. ? ? ?

## 2021-11-14 NOTE — Anesthesia Procedure Notes (Signed)
Procedure Name: LMA Insertion ?Date/Time: 11/14/2021 11:44 AM ?Performed by: Rolla Plate, CRNA ?Pre-anesthesia Checklist: Patient identified, Patient being monitored, Timeout performed, Emergency Drugs available and Suction available ?Patient Re-evaluated:Patient Re-evaluated prior to induction ?Oxygen Delivery Method: Circle system utilized ?Preoxygenation: Pre-oxygenation with 100% oxygen ?Induction Type: IV induction ?Ventilation: Mask ventilation without difficulty ?LMA: LMA inserted ?LMA Size: 4.0 ?Tube type: Oral ?Number of attempts: 1 ?Placement Confirmation: positive ETCO2 and breath sounds checked- equal and bilateral ?Tube secured with: Tape ?Dental Injury: Teeth and Oropharynx as per pre-operative assessment  ? ? ? ? ?

## 2021-11-14 NOTE — Anesthesia Preprocedure Evaluation (Signed)
Anesthesia Evaluation  ?Patient identified by MRN, date of birth, ID band ?Patient awake ? ? ? ?Reviewed: ?Allergy & Precautions, NPO status , Patient's Chart, lab work & pertinent test results ? ?History of Anesthesia Complications ?Negative for: history of anesthetic complications ? ?Airway ?Mallampati: II ? ?TM Distance: >3 FB ?Neck ROM: full ? ? ? Dental ? ?(+) Chipped ?  ?Pulmonary ?neg pulmonary ROS, neg shortness of breath,  ?  ?Pulmonary exam normal ? ? ? ? ? ? ? Cardiovascular ?Exercise Tolerance: Good ?hypertension, (-) angina(-) DOE Normal cardiovascular exam ? ? ?  ?Neuro/Psych ?PSYCHIATRIC DISORDERS negative neurological ROS ?   ? GI/Hepatic ?Neg liver ROS, GERD  Controlled,  ?Endo/Other  ?negative endocrine ROS ? Renal/GU ?  ? ?  ?Musculoskeletal ? ? Abdominal ?  ?Peds ? Hematology ?negative hematology ROS ?(+)   ?Anesthesia Other Findings ?Past Medical History: ?No date: Back pain ?No date: Cataract cortical, senile, left ?No date: DDD (degenerative disc disease), cervical ?No date: Decreased libido ?No date: Diverticulitis ?No date: Diverticulosis ?No date: Environmental allergies ?    Comment:  seasonal ?No date: GERD (gastroesophageal reflux disease) ?No date: Heart murmur ?    Comment:  1970s ?No date: HPV test positive ?No date: Hyperlipidemia ?No date: Hypertension ?No date: Menopausal and postmenopausal disorder ?No date: Menopause ?No date: Osteoarthritis ?No date: Osteopenia ?No date: Osteoporosis ?No date: Postmenopausal atrophic vaginitis ?No date: Rectocele ?No date: Shoulder pain, right ?    Comment:  rotator cuff tear ?No date: Thyroid nodule ? ?Past Surgical History: ?2000: BREAST CYST ASPIRATION; Bilateral ?    Comment:  benign ?No date: BUNIONECTOMY; Left ?No date: CATARACT EXTRACTION; Left ?No date: COLONOSCOPY ?    Comment:  1993, 2005, 2009 ?03/31/2018: COLONOSCOPY WITH PROPOFOL; N/A ?    Comment:  Procedure: COLONOSCOPY WITH PROPOFOL;  Surgeon:  Vira Agar, ?             Gavin Pound, MD;  Location: [facility];  Service:  ?             Endoscopy;  Laterality: N/A; ?No date: CRYOTHERAPY ?1993: ESOPHAGOGASTRODUODENOSCOPY ?05/24/2020: ESOPHAGOGASTRODUODENOSCOPY ?03/31/2018: ESOPHAGOGASTRODUODENOSCOPY (EGD) WITH PROPOFOL; N/A ?    Comment:  Procedure: ESOPHAGOGASTRODUODENOSCOPY (EGD) WITH  ?             PROPOFOL;  Surgeon: Manya Silvas, MD;  Location:  ?             [facility];  Service: Endoscopy;  Laterality: N/A; ?10/28/2017: SHOULDER ARTHROSCOPY; Right ?    Comment:  Procedure: ARTHROSCOPY SHOULDER EITH DEBRIDEMENT  ?             [place] AND REAPIR OF ROTATOR CUFF REPAIR;   ?             Surgeon: Corky Mull, MD;  Location: [facility]  ?             [facility];  Service: Orthopedics;  Laterality: Right; ?08/07/2014: TOTAL HIP ARTHROPLASTY; Right ?No date: TUBAL LIGATION ? ?BMI   ? Body Mass Index: 24.09 kg/m?  ?  ? ? Reproductive/Obstetrics ?negative OB ROS ? ?  ? ? ? ? ? ? ? ? ? ? ? ? ? ?  ?  ? ? ? ? ? ? ? ? ?Anesthesia Physical ?Anesthesia Plan ? ?ASA: 3 ? ?Anesthesia Plan: General LMA  ? ?Post-op Pain Management:   ? ?Induction: Intravenous ? ?PONV Risk Score and Plan: Dexamethasone, Ondansetron, Midazolam and Treatment may vary due to age  or medical condition ? ?Airway Management Planned: LMA ? ?Additional Equipment:  ? ?Intra-op Plan:  ? ?Post-operative Plan: Extubation in OR ? ?Informed Consent: I have reviewed the patients History and Physical, chart, labs and discussed the procedure including the risks, benefits and alternatives for the proposed anesthesia with the patient or authorized representative who has indicated his/her understanding and acceptance.  ? ? ? ?Dental Advisory Given ? ?Plan Discussed with: Anesthesiologist, CRNA and Surgeon ? ?Anesthesia Plan Comments: (Patient consented for risks of anesthesia including but not limited to:  ?- adverse reactions to medications ?- damage to eyes, teeth, lips or other oral mucosa ?-  nerve damage due to positioning  ?- sore throat or hoarseness ?- Damage to heart, brain, nerves, lungs, other parts of body or loss of life ? ?Patient voiced understanding.)  ? ? ? ? ? ? ?Anesthesia Quick Evaluation ? ?

## 2021-11-14 NOTE — Anesthesia Postprocedure Evaluation (Signed)
Anesthesia Post Note ? ?Patient: BAANI BOBER ? ?Procedure(s) Performed: Left, little finger proximal phalanx ORIF (Left: Finger) ? ?Patient location during evaluation: PACU ?Anesthesia Type: General ?Level of consciousness: awake and alert ?Pain management: pain level controlled ?Vital Signs Assessment: post-procedure vital signs reviewed and stable ?Respiratory status: spontaneous breathing, nonlabored ventilation, respiratory function stable and patient connected to nasal cannula oxygen ?Cardiovascular status: blood pressure returned to baseline and stable ?Postop Assessment: no apparent nausea or vomiting ?Anesthetic complications: no ? ? ?No notable events documented. ? ? ?Last Vitals:  ?Vitals:  ? 11/14/21 1333 11/14/21 1410  ?BP: 138/66 118/63  ?Pulse: 81 89  ?Resp: 16 16  ?Temp: 36.7 ?C   ?SpO2: 95% 99%  ?  ?Last Pain:  ?Vitals:  ? 11/14/21 1410  ?TempSrc:   ?PainSc: 4   ? ? ?  ?  ?  ?  ?  ?  ? ?Precious Haws Sherika Kubicki ? ? ? ? ?

## 2021-11-14 NOTE — Op Note (Signed)
11/14/2021 ? ?12:49 PM ? ?PATIENT:  Christina Foley  76 y.o. female ? ?PRE-OPERATIVE DIAGNOSIS:  Closed displace fracture of proximal phalanx of left little finger, initial encounter  S62.617A ? ?POST-OPERATIVE DIAGNOSIS:  Closed displace fracture of proximal phalanx of left little finger, initial encounter  S62.617A ? ?PROCEDURE:  Procedure(s) with comments: ?Left, little finger proximal phalanx ORIF (Left) - left little finger ? ?SURGEON: Laurene Footman, MD ? ?ASSISTANTS: None ? ?ANESTHESIA:   general ? ?EBL:  Total I/O ?In: 600 [I.V.:500; IV Piggyback:100] ?Out: -  ? ?BLOOD ADMINISTERED:none ? ?DRAINS: none  ? ?LOCAL MEDICATIONS USED:  MARCAINE    ? ?SPECIMEN:  No Specimen ? ?DISPOSITION OF SPECIMEN:  N/A ? ?COUNTS:  YES ? ?TOURNIQUET:   ?Total Tourniquet Time Documented: ?Upper Arm (Left) - 54 minutes ?Total: Upper Arm (Left) - 54 minutes ? ? ?IMPLANTS: Biomet's hand small T plate with multiple locking screw ? ?DICTATION: Viviann Spare Dictation patient was brought to the operating room and after adequate general anesthesia was obtained the left arm was prepped and draped in the usual sterile fashion.  After patient identification and timeout procedures were completed tourniquet was raised and a mid axial incision was made along the ulnar border of the proximal phalanx.  Soft tissues were spread and the periosteum elevated the fracture was quite comminuted approximately the large butterfly fragment.  A small T plate was modified so that it would not affect the flexor tendon and applied to the bone proximal and distal nonlocking screws were placed to reduce the fracture to the plate to get near anatomic alignment both AP and lateral projections following this multiple locking screws were placed with the initial nonlocking screw was replaced to locking screws.  On fluoroscopic view it appeared to be anatomic alignment.  The wound was irrigated and closed with simple erupted 4-0 nylon 10 cc of half percent  Sensorcaine placed for postop analgesia.  Xeroform 4 x 4's and a posterior dorsal splint holding the MCPs in flexion was applied. ? ?PLAN OF CARE: Discharge to home after PACU ? ?PATIENT DISPOSITION:  PACU - hemodynamically stable. ?  ? ?

## 2021-11-14 NOTE — Transfer of Care (Signed)
Immediate Anesthesia Transfer of Care Note ? ?Patient: Christina Foley ? ?Procedure(s) Performed: Left, little finger proximal phalanx ORIF (Left: Finger) ? ?Patient Location: PACU ? ?Anesthesia Type:General ? ?Level of Consciousness: awake, alert  and oriented ? ?Airway & Oxygen Therapy: Patient Spontanous Breathing and Patient connected to face mask oxygen ? ?Post-op Assessment: Report given to RN and Post -op Vital signs reviewed and stable ? ?Post vital signs: Reviewed ? ?Last Vitals:  ?Vitals Value Taken Time  ?BP    ?Temp    ?Pulse    ?Resp 10 11/14/21 1253  ?SpO2    ?Vitals shown include unvalidated device data. ? ?Last Pain:  ?Vitals:  ? 11/14/21 0932  ?TempSrc: Oral  ?PainSc: 0-No pain  ?   ? ?  ? ?Complications: No notable events documented. ?

## 2021-11-14 NOTE — Discharge Instructions (Addendum)
Keep arm elevated is much as he can the next few days and you can put ice to the side of your hand today and tomorrow that may help with pain. ?Keep dressing clean and dry ?Pain medicine as directed ?Call office if you are having problemsAMBULATORY SURGERY  ?DISCHARGE INSTRUCTIONS ? ? ?The drugs that you were given will stay in your system until tomorrow so for the next 24 hours you should not: ? ?Drive an automobile ?Make any legal decisions ?Drink any alcoholic beverage ? ? ?You may resume regular meals tomorrow.  Today it is better to start with liquids and gradually work up to solid foods. ? ?You may eat anything you prefer, but it is better to start with liquids, then soup and crackers, and gradually work up to solid foods. ? ? ?Please notify your doctor immediately if you have any unusual bleeding, trouble breathing, redness and pain at the surgery site, drainage, fever, or pain not relieved by medication. ?  ? ?Additional Instructions:  ?

## 2021-11-18 ENCOUNTER — Encounter: Payer: Self-pay | Admitting: Orthopedic Surgery

## 2021-11-19 DIAGNOSIS — Z96641 Presence of right artificial hip joint: Secondary | ICD-10-CM | POA: Diagnosis not present

## 2021-11-22 DIAGNOSIS — H2511 Age-related nuclear cataract, right eye: Secondary | ICD-10-CM | POA: Diagnosis not present

## 2021-11-28 DIAGNOSIS — Z9889 Other specified postprocedural states: Secondary | ICD-10-CM | POA: Diagnosis not present

## 2021-11-28 DIAGNOSIS — Z8781 Personal history of (healed) traumatic fracture: Secondary | ICD-10-CM | POA: Diagnosis not present

## 2021-11-29 ENCOUNTER — Other Ambulatory Visit: Payer: Self-pay | Admitting: Internal Medicine

## 2021-11-29 DIAGNOSIS — Z1231 Encounter for screening mammogram for malignant neoplasm of breast: Secondary | ICD-10-CM

## 2021-12-03 ENCOUNTER — Ambulatory Visit: Payer: PPO | Attending: Orthopedic Surgery | Admitting: Occupational Therapy

## 2021-12-03 DIAGNOSIS — M6281 Muscle weakness (generalized): Secondary | ICD-10-CM | POA: Diagnosis not present

## 2021-12-03 DIAGNOSIS — M25642 Stiffness of left hand, not elsewhere classified: Secondary | ICD-10-CM | POA: Insufficient documentation

## 2021-12-03 DIAGNOSIS — M79642 Pain in left hand: Secondary | ICD-10-CM | POA: Insufficient documentation

## 2021-12-03 DIAGNOSIS — L905 Scar conditions and fibrosis of skin: Secondary | ICD-10-CM | POA: Insufficient documentation

## 2021-12-03 NOTE — Evaluation (Signed)
?OUTPATIENT OCCUPATIONAL THERAPY ORTHO EVALUATION ? ?Patient Name: Christina Foley ?MRN: [id number] ?DOB:[birth year], 76 y.o., female ?Today's Date: 12/03/2021 ? ?PCP: Rusty Aus, MD ?REFERRING PROVIDER: Duanne Guess, PA-C ? ? OT End of Session - 12/03/21 1725   ? ? Visit Number 1   ? Number of Visits 14   ? Date for OT Re-Evaluation 01/28/22   ? OT Start Time 1410   ? OT Stop Time 1457   ? OT Time Calculation (min) 47 min   ? Activity Tolerance Patient tolerated treatment well   ? Behavior During Therapy [facility] for tasks assessed/performed   ? ?  ?  ? ?  ? ? ?Past Medical History:  ?Diagnosis Date  ? Back pain   ? Cataract cortical, senile, left   ? DDD (degenerative disc disease), cervical   ? Decreased libido   ? Diverticulitis   ? Diverticulosis   ? Environmental allergies   ? seasonal  ? GERD (gastroesophageal reflux disease)   ? Heart murmur   ? 1970s  ? HPV test positive   ? Hyperlipidemia   ? Hypertension   ? Menopausal and postmenopausal disorder   ? Menopause   ? Osteoarthritis   ? Osteopenia   ? Osteoporosis   ? Postmenopausal atrophic vaginitis   ? Rectocele   ? Shoulder pain, right   ? rotator cuff tear  ? Thyroid nodule   ? ?Past Surgical History:  ?Procedure Laterality Date  ? BREAST CYST ASPIRATION Bilateral 2000  ? benign  ? BUNIONECTOMY Left   ? CATARACT EXTRACTION Left   ? COLONOSCOPY    ? 1993, 2005, 2009  ? COLONOSCOPY WITH PROPOFOL N/A 03/31/2018  ? Procedure: COLONOSCOPY WITH PROPOFOL;  Surgeon: Manya Silvas, MD;  Location: [facility] ENDOSCOPY;  Service: Endoscopy;  Laterality: N/A;  ? CRYOTHERAPY    ? ESOPHAGOGASTRODUODENOSCOPY  1993  ? ESOPHAGOGASTRODUODENOSCOPY  05/24/2020  ? ESOPHAGOGASTRODUODENOSCOPY (EGD) WITH PROPOFOL N/A 03/31/2018  ? Procedure: ESOPHAGOGASTRODUODENOSCOPY (EGD) WITH PROPOFOL;  Surgeon: Manya Silvas, MD;  Location: [facility] ENDOSCOPY;  Service: Endoscopy;  Laterality: N/A;  ? OPEN REDUCTION INTERNAL FIXATION (ORIF) PROXIMAL PHALANX Left 11/14/2021  ? Procedure:  Left, little finger proximal phalanx ORIF;  Surgeon: Hessie Knows, MD;  Location: [facility] ORS;  Service: Orthopedics;  Laterality: Left;  left little finger  ? SHOULDER ARTHROSCOPY Right 10/28/2017  ? Procedure: ARTHROSCOPY SHOULDER EITH DEBRIDEMENT DECOMPRESSIOIN AND REAPIR OF ROTATOR CUFF REPAIR;  Surgeon: Corky Mull, MD;  Location: [facility];  Service: Orthopedics;  Laterality: Right;  ? TOTAL HIP ARTHROPLASTY Right 08/07/2014  ? TUBAL LIGATION    ? ?Patient Active Problem List  ? Diagnosis Date Noted  ? Vaginal atrophy 02/07/2016  ? Vulvitis 02/07/2016  ? Menopause 02/07/2016  ? Osteopenia 02/07/2016  ? HPV test positive 02/07/2016  ? Essential hypertension 02/07/2016  ? Diverticulosis 02/07/2016  ? ? ?ONSET DATE:11/14/21 ? ?REFERRING DIAG: L 5th proximal phalanges ORIF ? ?THERAPY DIAG:  ? ? ?SUBJECTIVE:  ? ?SUBJECTIVE STATEMENT: ?Pt accompanied by: self ? ?PERTINENT HISTORY: Christina Foley is a 76 y.o. female here today status post left little finger ORIF by Dr. Hessie Knows on 11/14/2021. Patient is doing well. Pain well controlled. Still having some limitation with flexion and slight limitation with extension ? ?PRECAUTIONS: None ? ?WEIGHT BEARING RESTRICTIONS no pulling and pushing with L hand  ? ?PAIN:  ?Are you having pain? No ? ?FALLS: Has patient fallen in last 6 months? No ? ?LIVING ENVIRONMENT: ?Lives with: lives  with their spouse ?PATIENT GOALS Want my motion and strength back in L hand to do my art , crafts and house work ? ?OBJECTIVE:  ? ?HAND DOMINANCE: Right ? ? ? ?EDEMA: L proximal phalanges 6.1 cm , R 4.7 cm  ? ? ?OBSERVATIONS: Patient arrive with 4 Steri-Strips still in place on ulnar side of proximal phalanges proximal and distal once removed-patient added on scar massage-can remove middle to in the next few days ? ? ?TODAY'S TREATMENT:  ?Contrast done today-patient added on doing at home 3 times a day prior to home exercises-to decrease edema-gentle passive range of motion  block to DIP of fifth digit-blocked active range of motion for PIP and MC flexion-buddy strap in place to wear during composite flexion to red foam roller-keep pain under 1 or 2 ? ? ?PATIENT EDUCATION: ?Education details:HEP and findings of HEP  ?Person educated: Patient ?Education method: Explanation, Demonstration, Verbal cues, and Handouts ?Education comprehension: verbalized understanding and returned demonstration ? ? ?HOME EXERCISE PROGRAM: ?Contrast 3 times a day to left hand, scar massage initiating, passive range of motion blocked DIP flexion, active range of motion to PIP and MCP with active extension in between.  Composite flexion to right thumb using buddy strap keep pain under a 2 out of 10 ? ?GOALS: ?Goals reviewed with patient? Yes ? ?SHORT TERM GOALS: Target date: 12/31/2021 ? ?Patient to be independent in home program to decrease edema increase scar tissue to increase motion in left fifth digit ?Baseline: No knowledge of scar massage, increase edema and decreased motion ?Goal status: INITIAL ?LONG TERM GOALS: Target date: 01/28/2022 ? ?Patient left fifth digit flexion active range of motion increase for patient to touch palm to drive and squeeze washcloth ?Baseline: D IP 25, P IP 40, MC80 flexion of ?Goal status: INITIAL ? ?2.  Patient able to make composite fist with left fifth digit to squeeze washcloth with no increase symptoms ?Baseline: Unable to touch palm increase scar tissue increase edema, patient only 2-1/2 weeks out ?Goal status: INITIAL ? ?3.  Patient's left grip strength increased to more than 75% compared to right hand to return to crafts and art activities without increase symptoms ?Baseline: Not tested patient is 2-1/2 weeks out from surgery ?Goal status: INITIAL ? ?ASSESSMENT: ? ?CLINICAL IMPRESSION: ?Patient is a ? ?PERFORMANCE DEFICITS in functional skills including ADLs, IADLs, edema, ROM, strength, pain, flexibility, and UE functional use, ,  ?IMPAIRMENTS are limiting patient from  ADLs, IADLs, play, leisure, and social participation.  ? ?COMORBIDITIES has no other co-morbidities that affects occupational performance. Patient will benefit from skilled OT to address above impairments and improve overall function. ? ?MODIFICATION OR ASSISTANCE TO COMPLETE EVALUATION: No modification of tasks or assist necessary to complete an evaluation. ? ?OT OCCUPATIONAL PROFILE AND HISTORY: Problem focused assessment: Including review of records relating to presenting problem. ? ?CLINICAL DECISION MAKING: LOW - limited treatment options, no task modification necessary ? ?REHAB POTENTIAL: Good ? ?EVALUATION COMPLEXITY: Low ? ? ? ? ? ?PLAN: ?OT FREQUENCY: 1-2x/week ? ?OT DURATION: 8 weeks ? ?PLANNED INTERVENTIONS: self care/ADL training, therapeutic exercise, manual therapy, scar mobilization, passive range of motion, splinting, paraffin, fluidotherapy, and contrast bath ? ? ? ?CONSULTED AND AGREED WITH PLAN OF CARE: Patient ? ?PLAN FOR NEXT SESSION: Assess improvement in edema and scar tissue, assess progress with active range of motion home exercises ,change home exercise as needed ? ? ?Rosalyn Gess, OTR/L,CLT ?12/03/2021, 5:27 PM ?  ?

## 2021-12-04 NOTE — Therapy (Addendum)
[facility] ?[address] ?[place], [place], [address] ?Phone: [phone]   Fax:  [phone] ? ?  ?  ?[facility] EVALUATION ?  ?Patient Name: Christina Foley ?MRN: [id number] ?DOB:01/28/1946, 76 y.o., female ?Today's Date: 12/03/2021 ?  ?PCP: Rusty Aus, MD ?REFERRING PROVIDER: Duanne Guess, PA-C ?  ?  OT End of Session - 12/03/21 1725   ?  ?  Visit Number 1   ?  Number of Visits 14   ?  Date for OT Re-Evaluation 01/28/22   ?  OT Start Time 1410   ?  OT Stop Time 1457   ?  OT Time Calculation (min) 47 min   ?  Activity Tolerance Patient tolerated treatment well   ?  Behavior During Therapy [facility] for tasks assessed/performed   ?  ?   ?  ?  ?   ?  ?  ?    ?Past Medical History:  ?Diagnosis Date  ? Back pain    ? Cataract cortical, senile, left    ? DDD (degenerative disc disease), cervical    ? Decreased libido    ? Diverticulitis    ? Diverticulosis    ? Environmental allergies    ?  seasonal  ? GERD (gastroesophageal reflux disease)    ? Heart murmur    ?  1970s  ? HPV test positive    ? Hyperlipidemia    ? Hypertension    ? Menopausal and postmenopausal disorder    ? Menopause    ? Osteoarthritis    ? Osteopenia    ? Osteoporosis    ? Postmenopausal atrophic vaginitis    ? Rectocele    ? Shoulder pain, right    ?  rotator cuff tear  ? Thyroid nodule    ?  ?     ?Past Surgical History:  ?Procedure Laterality Date  ? BREAST CYST ASPIRATION Bilateral 2000  ?  benign  ? BUNIONECTOMY Left    ? CATARACT EXTRACTION Left    ? COLONOSCOPY      ?  1993, 2005, 2009  ? COLONOSCOPY WITH PROPOFOL N/A 03/31/2018  ?  Procedure: COLONOSCOPY WITH PROPOFOL;  Surgeon: Manya Silvas, MD;  Location: [facility] ENDOSCOPY;  Service: Endoscopy;  Laterality: N/A;  ? CRYOTHERAPY      ? ESOPHAGOGASTRODUODENOSCOPY   1993  ? ESOPHAGOGASTRODUODENOSCOPY   05/24/2020  ? ESOPHAGOGASTRODUODENOSCOPY (EGD) WITH PROPOFOL N/A 03/31/2018  ?  Procedure:  ESOPHAGOGASTRODUODENOSCOPY (EGD) WITH PROPOFOL;  Surgeon: Manya Silvas, MD;  Location: [facility] ENDOSCOPY;  Service: Endoscopy;  Laterality: N/A;  ? OPEN REDUCTION INTERNAL FIXATION (ORIF) PROXIMAL PHALANX Left 11/14/2021  ?  Procedure: Left, little finger proximal phalanx ORIF;  Surgeon: Hessie Knows, MD;  Location: [facility] ORS;  Service: Orthopedics;  Laterality: Left;  left little finger  ? SHOULDER ARTHROSCOPY Right 10/28/2017  ?  Procedure: ARTHROSCOPY SHOULDER EITH DEBRIDEMENT DECOMPRESSIOIN AND REAPIR OF ROTATOR CUFF REPAIR;  Surgeon: Corky Mull, MD;  Location: [facility];  Service: Orthopedics;  Laterality: Right;  ? TOTAL HIP ARTHROPLASTY Right 08/07/2014  ? TUBAL LIGATION      ?  ?    ?Patient Active Problem List  ?  Diagnosis Date Noted  ? Vaginal atrophy 02/07/2016  ? Vulvitis 02/07/2016  ? Menopause 02/07/2016  ? Osteopenia 02/07/2016  ? HPV test positive 02/07/2016  ? Essential hypertension 02/07/2016  ? Diverticulosis 02/07/2016  ?  ?  ?ONSET  DATE:11/14/21 ?  ?REFERRING DIAG: L 5th proximal phalanges ORIF ?  ?  ?SUBJECTIVE:  ?  ?SUBJECTIVE STATEMENT: ?Pt accompanied by: self ?  ?PERTINENT HISTORY: TAI SYFERT is a 76 y.o. female here today status post left little finger ORIF by Dr. Hessie Knows on 11/14/2021. Patient is doing well. Pain well controlled. Still having some limitation with flexion and slight limitation with extension ?  ?PRECAUTIONS: None ?  ?WEIGHT BEARING RESTRICTIONS no pulling and pushing with L hand  ?  ?PAIN:  ?Are you having pain? No ?  ?FALLS: Has patient fallen in last 6 months? No ?  ?LIVING ENVIRONMENT: ?Lives with: lives with their spouse ?PATIENT GOALS Want my motion and strength back in L hand to do my art , crafts and house work ?  ?OBJECTIVE:  ?  ?HAND DOMINANCE: Right ?  ?  ?  ?EDEMA: L proximal phalanges 6.1 cm , R 4.7 cm  ?  ?  ?OBSERVATIONS: Patient arrive with 4 Steri-Strips still in place on ulnar side of proximal phalanges proximal and distal once  removed-patient added on scar massage-can remove middle to in the next few days ?  ?  ?TODAY'S TREATMENT:  ?Contrast done today-patient added on doing at home 3 times a day prior to home exercises-to decrease edema-gentle passive range of motion block to DIP of fifth digit-blocked active range of motion for PIP and MC flexion-buddy strap in place to wear during composite flexion to red foam roller-keep pain under 1 or 2 ?  ?  ?PATIENT EDUCATION: ?Education details:HEP and findings of HEP  ?Person educated: Patient ?Education method: Explanation, Demonstration, Verbal cues, and Handouts ?Education comprehension: verbalized understanding and returned demonstration ?  ?  ?HOME EXERCISE PROGRAM: ?Contrast 3 times a day to left hand, scar massage initiating, passive range of motion blocked DIP flexion, active range of motion to PIP and MCP with active extension in between.  Composite flexion to right thumb using buddy strap keep pain under a 2 out of 10 ?  ?GOALS: ?Goals reviewed with patient? Yes ?  ?SHORT TERM GOALS: Target date: 12/31/2021 ?  ?Patient to be independent in home program to decrease edema increase scar tissue to increase motion in left fifth digit ?Baseline: No knowledge of scar massage, increase edema and decreased motion ?Goal status: INITIAL ?LONG TERM GOALS: Target date: 01/28/2022 ?  ?Patient left fifth digit flexion active range of motion increase for patient to touch palm to drive and squeeze washcloth ?Baseline: D IP 25, P IP 40, MC80 flexion of ?Goal status: INITIAL ?  ?2.  Patient able to make composite fist with left fifth digit to squeeze washcloth with no increase symptoms ?Baseline: Unable to touch palm increase scar tissue increase edema, patient only 2-1/2 weeks out ?Goal status: INITIAL ?  ?3.  Patient's left grip strength increased to more than 75% compared to right hand to return to crafts and art activities without increase symptoms ?Baseline: Not tested patient is 2-1/2 weeks out from  surgery ?Goal status: INITIAL ?  ?ASSESSMENT: ?  ?CLINICAL IMPRESSION: ?Patient present at OT evaluation with left fifth ORIF of proximal phalanges.  Patient 2-1/2 weeks postop.  Patient with increased swelling and fifth digit.  Increase stiffness at all joints in flexion decreased extension at PIP.  Increased pain increase muscle weakness.  Patient limited in ADLs and IADLs using left nondominant hand.  Patient can benefit from skilled OT services to increase range of motion and functional use. ?  ?PERFORMANCE DEFICITS in  functional skills including ADLs, IADLs, edema, ROM, strength, pain, flexibility, and UE functional use, ,  ?IMPAIRMENTS are limiting patient from ADLs, IADLs, play, leisure, and social participation.  ?  ?COMORBIDITIES has no other co-morbidities that affects occupational performance. Patient will benefit from skilled OT to address above impairments and improve overall function. ?  ?MODIFICATION OR ASSISTANCE TO COMPLETE EVALUATION: No modification of tasks or assist necessary to complete an evaluation. ?  ?OT OCCUPATIONAL PROFILE AND HISTORY: Problem focused assessment: Including review of records relating to presenting problem. ?  ?CLINICAL DECISION MAKING: LOW - limited treatment options, no task modification necessary ?  ?REHAB POTENTIAL: Good ?  ?EVALUATION COMPLEXITY: Low ?  ?  ?  ?  ?PLAN: ?OT FREQUENCY: 1-2x/week ?  ?OT DURATION: 8 weeks ?  ?PLANNED INTERVENTIONS: self care/ADL training, therapeutic exercise, manual therapy, scar mobilization, passive range of motion, splinting, paraffin, fluidotherapy, and contrast bath ?  ?  ?  ?CONSULTED AND AGREED WITH PLAN OF CARE: Patient ?  ?PLAN FOR NEXT SESSION: Assess improvement in edema and scar tissue, assess progress with active range of motion home exercises ,change home exercise as needed ?  ?  ?Rosalyn Gess, OTR/L,CLT ?12/03/2021, 5:27 PM ?   ?  ? ?Communications ? ?Brewing technologist Summary ?Media ?From this  encounter ?Electronic signature on 12/03/2021 2:01 PM - 1 of 3 e-signatures recorded  ?Scan on 11/29/2021 10:03 AM by Jorje Guild P: OT Order-Chris Arvella Nigh  ? ?Communication Routing History ? ?None  ?No questionnair

## 2021-12-09 ENCOUNTER — Ambulatory Visit: Payer: PPO | Attending: Orthopedic Surgery | Admitting: Occupational Therapy

## 2021-12-09 DIAGNOSIS — L905 Scar conditions and fibrosis of skin: Secondary | ICD-10-CM | POA: Diagnosis not present

## 2021-12-09 DIAGNOSIS — M79642 Pain in left hand: Secondary | ICD-10-CM | POA: Insufficient documentation

## 2021-12-09 DIAGNOSIS — M6281 Muscle weakness (generalized): Secondary | ICD-10-CM | POA: Diagnosis not present

## 2021-12-09 DIAGNOSIS — M25642 Stiffness of left hand, not elsewhere classified: Secondary | ICD-10-CM | POA: Diagnosis not present

## 2021-12-09 NOTE — Therapy (Signed)
?OUTPATIENT OCCUPATIONAL THERAPY TREATMENT NOTE ? ? ?Patient Name: Christina Foley ?MRN: [id number] ?DOB:06-Sep-1946, 76 y.o., female ?Today's Date: 12/09/2021 ? ?PCP: Rusty Aus, MD ?REFERRING PROVIDER: Duanne Guess, PA-C ? ? OT End of Session - 12/09/21 1100   ? ? Visit Number 2   ? Number of Visits 14   ? Date for OT Re-Evaluation 01/28/22   ? OT Start Time [phone]   ? OT Stop Time 1030   ? OT Time Calculation (min) 46 min   ? Activity Tolerance Patient tolerated treatment well   ? Behavior During Therapy [facility] for tasks assessed/performed   ? ?  ?  ? ?  ? ? ?Past Medical History:  ?Diagnosis Date  ? Back pain   ? Cataract cortical, senile, left   ? DDD (degenerative disc disease), cervical   ? Decreased libido   ? Diverticulitis   ? Diverticulosis   ? Environmental allergies   ? seasonal  ? GERD (gastroesophageal reflux disease)   ? Heart murmur   ? 1970s  ? HPV test positive   ? Hyperlipidemia   ? Hypertension   ? Menopausal and postmenopausal disorder   ? Menopause   ? Osteoarthritis   ? Osteopenia   ? Osteoporosis   ? Postmenopausal atrophic vaginitis   ? Rectocele   ? Shoulder pain, right   ? rotator cuff tear  ? Thyroid nodule   ? ?Past Surgical History:  ?Procedure Laterality Date  ? BREAST CYST ASPIRATION Bilateral 2000  ? benign  ? BUNIONECTOMY Left   ? CATARACT EXTRACTION Left   ? COLONOSCOPY    ? 1993, 2005, 2009  ? COLONOSCOPY WITH PROPOFOL N/A 03/31/2018  ? Procedure: COLONOSCOPY WITH PROPOFOL;  Surgeon: Manya Silvas, MD;  Location: [facility] ENDOSCOPY;  Service: Endoscopy;  Laterality: N/A;  ? CRYOTHERAPY    ? ESOPHAGOGASTRODUODENOSCOPY  1993  ? ESOPHAGOGASTRODUODENOSCOPY  05/24/2020  ? ESOPHAGOGASTRODUODENOSCOPY (EGD) WITH PROPOFOL N/A 03/31/2018  ? Procedure: ESOPHAGOGASTRODUODENOSCOPY (EGD) WITH PROPOFOL;  Surgeon: Manya Silvas, MD;  Location: [facility] ENDOSCOPY;  Service: Endoscopy;  Laterality: N/A;  ? OPEN REDUCTION INTERNAL FIXATION (ORIF) PROXIMAL PHALANX Left 11/14/2021  ? Procedure:  Left, little finger proximal phalanx ORIF;  Surgeon: Hessie Knows, MD;  Location: [facility] ORS;  Service: Orthopedics;  Laterality: Left;  left little finger  ? SHOULDER ARTHROSCOPY Right 10/28/2017  ? Procedure: ARTHROSCOPY SHOULDER EITH DEBRIDEMENT DECOMPRESSIOIN AND REAPIR OF ROTATOR CUFF REPAIR;  Surgeon: Corky Mull, MD;  Location: [place];  Service: Orthopedics;  Laterality: Right;  ? TOTAL HIP ARTHROPLASTY Right 08/07/2014  ? TUBAL LIGATION    ? ?Patient Active Problem List  ? Diagnosis Date Noted  ? Vaginal atrophy 02/07/2016  ? Vulvitis 02/07/2016  ? Menopause 02/07/2016  ? Osteopenia 02/07/2016  ? HPV test positive 02/07/2016  ? Essential hypertension 02/07/2016  ? Diverticulosis 02/07/2016  ? ? ?ONSET DATE: 11/14/21 ? ?REFERRING DIAG: Left fifth proximal phalanges ORIF ? ? ? ?PERTINENT HISTORY: [name] is a 76 y.o. female here today status post left little finger ORIF by Dr. Hessie Knows on 11/14/2021. Patient is doing well. Pain well controlled. Still having some limitation with flexion and slight limitation with extension ?   ? ?PRECAUTIONS: NONE ? ?SUBJECTIVE: My last Steri-Strip came off last night doing okay with my exercises.  Not really pain but still really stiff. ? ?PAIN:  ?Are you having pain? 5/10 with AROM end range ? ? ? ? ?OBJECTIVE: OBJECTIVE:  ?  ?HAND  DOMINANCE: Right ?  ?  ?  ?EDEMA: L proximal phalanges 6.1 cm , R 4.7 cm  ?  ?  ?OBSERVATIONS: Patient arrive with 4 Steri-Strips still in place on ulnar side of proximal phalanges proximal and distal once removed-patient added on scar massage-can remove middle to in the next few days ?  ?  ? TREATMENT today:  ? [facility] OT Assessment - 12/09/21 0001   ? ?  ? Strength  ? Right Hand Grip (lbs) NT   ? Left Hand Grip (lbs) NT   ?  ? Left Hand AROM  ? L Little  MCP 0-90 85 Degrees   80 evak  ? L Little PIP 0-100 60 Degrees   eval 40  ? L Little DIP 0-70 37 Degrees   eval 25  ? ?  ?  ? ?  ?  ? [facility] OT Assessment - 12/09/21 0001    ? ?  ? Strength  ? Right Hand Grip (lbs) NT   ? Left Hand Grip (lbs) NT   ?  ? Left Hand AROM  ? L Little  MCP 0-90 85 Degrees   80 evak  ? L Little PIP 0-100 60 Degrees   eval 40  ? L Little DIP 0-70 37 Degrees   eval 25  ? ?  ?  ? ?  ?Patient arrived first postop visit with increased MC flexion PIP and DIP flexion.  Edema decreased to 5.4 at left proximal phalanges and PIP.  Steri-Strips came off.  Scar adhesion mostly in the middle.  Scar massage was reviewed again with patient and done with mini massager and extractor.  Patient added on using Coban for extra traction.  Fitted with silicone sleeve small.  Patient to get it on [place] information provided.   ?Reviewed with patient range of motion.  PROM to DIP.  Active range of motion to [facility] and PIP to 2 cm foam block pain-free.  When patient working on composite flexion to block with pain at distal palmar crease.  Patient can use buddy strap distal to PIP during composite flexion if pain less than 2/10. ?We will decrease also crossing over of fifth over fourth digit. ?Patient to focus in between flexion at PIP extension during lumbrical fist and extension on table. ?Patient will be out of town this next week we will follow-up on the 13th prior to doctors visit. ? ?12/03/21 : Contrast done today-patient added on doing at home 3 times a day prior to home exercises-to decrease edema-gentle passive range of motion block to DIP of fifth digit-blocked active range of motion for PIP and MC flexion-buddy strap in place to wear during composite flexion to red foam roller-keep pain under 1 or 2 ?  ?  ?PATIENT EDUCATION: ?Education details:HEP and findings of HEP  ?Person educated: Patient ?Education method: Explanation, Demonstration, Verbal cues, and Handouts ?Education comprehension: verbalized understanding and returned demonstration ?  ?  ?HOME EXERCISE PROGRAM: ?Contrast 3 times a day to left hand, scar massage initiating, passive range of motion blocked DIP flexion,  active range of motion to PIP and MCP with active extension in between.  Composite flexion to right thumb using buddy strap keep pain under a 2 out of 10 ?  ?GOALS: ?Goals reviewed with patient? Yes ?  ?SHORT TERM GOALS: Target date: 12/31/2021 ?  ?Patient to be independent in home program to decrease edema increase scar tissue to increase motion in left fifth digit ?Baseline: No knowledge of scar massage, increase  edema and decreased motion ?Goal status: INITIAL ?LONG TERM GOALS: Target date: 01/28/2022 ?  ?Patient left fifth digit flexion active range of motion increase for patient to touch palm to drive and squeeze washcloth ?Baseline: D IP 25, P IP 40, MC80 flexion of ?Goal status: INITIAL ?  ?2.  Patient able to make composite fist with left fifth digit to squeeze washcloth with no increase symptoms ?Baseline: Unable to touch palm increase scar tissue increase edema, patient only 2-1/2 weeks out ?Goal status: INITIAL ?  ?3.  Patient's left grip strength increased to more than 75% compared to right hand to return to crafts and art activities without increase symptoms ?Baseline: Not tested patient is 2-1/2 weeks out from surgery ?Goal status: INITIAL ?  ?ASSESSMENT: ?  ?CLINICAL IMPRESSION: ?Patient present at OT evaluation with left fifth ORIF of proximal phalanges.  Patient 3-1/2 weeks postop with first visit after evaluation.  Steri-Strips off.  Patient show increase of 20 to 25 degrees at PIP joint 12 degrees of DIP.  To change home program for composite fist to block MC at 90 to put force through PIP.  Reinforced importance of extension between flexion.  Scar massage change to focus on middle part for scar adhesion to increase extension and flexion.  Patient still to continue with active range of motion more than PIP until 5-6 weeks postop.  Patient can continue to benefit from OT services to decrease scar tissue, decrease edema and increase range of motion and strength.   ? ?PERFORMANCE DEFICITS in functional  skills including ADLs, IADLs, edema, ROM, strength, pain, flexibility, and UE functional use, ,  ?IMPAIRMENTS are limiting patient from ADLs, IADLs, play, leisure, and social participation.  ?  ?COMORB

## 2021-12-19 ENCOUNTER — Encounter: Payer: Self-pay | Admitting: Occupational Therapy

## 2021-12-19 ENCOUNTER — Ambulatory Visit: Payer: PPO | Admitting: Occupational Therapy

## 2021-12-19 DIAGNOSIS — M25642 Stiffness of left hand, not elsewhere classified: Secondary | ICD-10-CM

## 2021-12-19 DIAGNOSIS — L905 Scar conditions and fibrosis of skin: Secondary | ICD-10-CM

## 2021-12-19 DIAGNOSIS — M6281 Muscle weakness (generalized): Secondary | ICD-10-CM

## 2021-12-19 DIAGNOSIS — M79642 Pain in left hand: Secondary | ICD-10-CM

## 2021-12-19 NOTE — Therapy (Signed)
[place] [facility] ?[address] ?[place], [place], [address] ?Phone: [phone]   Fax:  [phone] ? ?Occupational Therapy Treatment ? ?Patient Details  ?Name: Christina Foley ?MRN: [id number] ?Date of Birth: 23-Jan-1946 ?No data recorded ? ?Encounter Date: 12/19/2021 ? ? OT End of Session - 12/19/21 1521   ? ? Visit Number 3   ? Number of Visits 14   ? Date for OT Re-Evaluation 01/28/22   ? OT Start Time 0945   ? OT Stop Time 1032   ? OT Time Calculation (min) 47 min   ? Activity Tolerance Patient tolerated treatment well   ? Behavior During Therapy [facility] for tasks assessed/performed   ? ?  ?  ? ?  ? ? ?Past Medical History:  ?Diagnosis Date  ? Back pain   ? Cataract cortical, senile, left   ? DDD (degenerative disc disease), cervical   ? Decreased libido   ? Diverticulitis   ? Diverticulosis   ? Environmental allergies   ? seasonal  ? GERD (gastroesophageal reflux disease)   ? Heart murmur   ? 1970s  ? HPV test positive   ? Hyperlipidemia   ? Hypertension   ? Menopausal and postmenopausal disorder   ? Menopause   ? Osteoarthritis   ? Osteopenia   ? Osteoporosis   ? Postmenopausal atrophic vaginitis   ? Rectocele   ? Shoulder pain, right   ? rotator cuff tear  ? Thyroid nodule   ? ? ?Past Surgical History:  ?Procedure Laterality Date  ? BREAST CYST ASPIRATION Bilateral 2000  ? benign  ? BUNIONECTOMY Left   ? CATARACT EXTRACTION Left   ? COLONOSCOPY    ? 1993, 2005, 2009  ? COLONOSCOPY WITH PROPOFOL N/A 03/31/2018  ? Procedure: COLONOSCOPY WITH PROPOFOL;  Surgeon: Manya Silvas, MD;  Location: [facility] ENDOSCOPY;  Service: Endoscopy;  Laterality: N/A;  ? CRYOTHERAPY    ? ESOPHAGOGASTRODUODENOSCOPY  1993  ? ESOPHAGOGASTRODUODENOSCOPY  05/24/2020  ? ESOPHAGOGASTRODUODENOSCOPY (EGD) WITH PROPOFOL N/A 03/31/2018  ? Procedure: ESOPHAGOGASTRODUODENOSCOPY (EGD) WITH PROPOFOL;  Surgeon: Manya Silvas, MD;  Location: [facility] ENDOSCOPY;  Service: Endoscopy;   Laterality: N/A;  ? OPEN REDUCTION INTERNAL FIXATION (ORIF) PROXIMAL PHALANX Left 11/14/2021  ? Procedure: Left, little finger proximal phalanx ORIF;  Surgeon: Hessie Knows, MD;  Location: [facility] ORS;  Service: Orthopedics;  Laterality: Left;  left little finger  ? SHOULDER ARTHROSCOPY Right 10/28/2017  ? Procedure: ARTHROSCOPY SHOULDER EITH DEBRIDEMENT DECOMPRESSIOIN AND REAPIR OF ROTATOR CUFF REPAIR;  Surgeon: Corky Mull, MD;  Location: [place];  Service: Orthopedics;  Laterality: Right;  ? TOTAL HIP ARTHROPLASTY Right 08/07/2014  ? TUBAL LIGATION    ? ? ?There were no vitals filed for this visit. ? ? Subjective Assessment - 12/19/21 0954   ? ? Subjective  Pt reports she has an appt to follow up with Dr. Rudene Christians tomorrow, plan is to do xrays.  Pt reports she is having symptoms of sciatica down left leg, she reports having it in the past and not sure if she can travel at the end of next week for a trip with her husband to [place].   ? Currently in Pain? Yes   ? Pain Score 8    ? Pain Location Leg   ? Pain Orientation Right   ? Pain Descriptors / Indicators Sharp;Shooting   ? Pain Type Acute pain   ? Pain Radiating Towards pain radiates from back to knee  along the back of the leg.   ? Pain Onset 1 to 4 weeks ago   ? Pain Frequency Constant   ? Multiple Pain Sites No   ? ?  ?  ? ?  ? ? ? ? ? [facility] OT Assessment - 12/19/21 0001   ? ?  ? Left Hand AROM  ? L Little  MCP 0-90 90 Degrees   ? L Little PIP 0-100 60 Degrees   ? L Little DIP 0-70 37 Degrees   ? ?  ?  ? ?  ? ?OBJECTIVE:  ?  ?HAND DOMINANCE: Right ?  ?  ?  ?EDEMA: L proximal phalanges 6.1 cm , R 4.7 cm  ?  ?  ?OBSERVATIONS:  Mild edema still present in small finger, has been performing contrast at home for edema control, performs multiple times a day. ?  ?  ?TODAY'S TREATMENT:  ? ?Fluidotherapy this date to left hand for 10 mins prior to therapeutic exercises to decrease pain, increase tissue mobility and improve ROM and use.  Pt instructed to work on  active ROM while in [place].   ? ?Manual Therapy:  Scar massage to left small finger scar, use of coban over scar while applying small vibration, use of extractor on scar to decrease adhesions.   ? ?Performed A/AAROM to left small finger for MCP, PIP and DIP flexion.  Gentle passive range of motion block to DIP of fifth digit-blocked active range of motion for PIP and MC flexion, pt has a buddy strap in place to wear during composite flexion to red foam roller-keep pain under 1 or 2 ?Use of red foam to roll back and forth with fingers in extension ?Claw fist with foam at MCPs, then large grip pen   ?  ?  ?PATIENT EDUCATION: ?Education details:HEP and findings of HEP  ?Person educated: Patient ?Education method: Explanation, Demonstration, Verbal cues, and Handouts ?Education comprehension: verbalized understanding and returned demonstration ?  ?  ?HOME EXERCISE PROGRAM: ?Contrast 3 times a day to left hand, scar massage initiating, passive range of motion blocked DIP flexion, active range of motion to PIP and MCP with active extension in between.  Composite flexion to right thumb using buddy strap keep pain under a 2 out of 10 ?Pt ordered and received silicone finger sleeve to use at home.   ? ? ? ? ? ? ? ? ? ? ? ? ? ? ? ? ? OT Education - 12/19/21 1521   ? ? Education Details HEP, scar massage   ? Person(s) Educated Patient   ? Methods Explanation;Demonstration;Verbal cues   ? Comprehension Verbalized understanding;Returned demonstration;Verbal cues required   ? ?  ?  ? ?  ? ?GOALS: ?Goals reviewed with patient? Yes ?  ?SHORT TERM GOALS: Target date: 12/31/2021 ?  ?Patient to be independent in home program to decrease edema increase scar tissue to increase motion in left fifth digit ?Baseline: No knowledge of scar massage, increase edema and decreased motion ?Goal status: INITIAL ?LONG TERM GOALS: Target date: 01/28/2022 ?  ?Patient left fifth digit flexion active range of motion increase for patient to touch  palm to drive and squeeze washcloth ?Baseline: D IP 25, P IP 40, MC80 flexion of ?Goal status: INITIAL ?  ?2.  Patient able to make composite fist with left fifth digit to squeeze washcloth with no increase symptoms ?Baseline: Unable to touch palm increase scar tissue increase edema, patient only 2-1/2 weeks out ?Goal status: INITIAL ?  ?3.  Patient's left grip strength increased to more than 75% compared to right hand to return to crafts and art activities without increase symptoms ?Baseline: Not tested patient is 2-1/2 weeks out from surgery ?Goal status: INITIAL ? ? ? ? ? ? ? ? ? ? ? Plan - 12/19/21 1522   ? ? Clinical Impression Statement Patient s/p with left fifth ORIF of proximal phalanges, 5 weeks post this week. Pt has continued to make progress with edema, scar massage and motion.  She has a follow up visit tomorrow with the surgeon and planning to have xrays.  Pt continuing to focus on middle part of scar to decrease adhesion and improve scar and tissue mobility.  Edema still present in small finger and has been using contrast bath 2-3 times a day to manage edema.  Reports it appears decreased this date overall.  Pt to continue with home program of edema control, scar massage, ROM and working towards improved functional use in daily tasks.  Patient can continue to benefit from OT services to decrease scar tissue, decrease edema and increase range of motion and strength.   ? OT Occupational Profile and History Problem Focused Assessment - Including review of records relating to presenting problem   ? Occupational performance deficits (Please refer to evaluation for details): ADL's;IADL's;Leisure   ? Body Structure / Function / Physical Skills ADL;Scar mobility;UE functional use;IADL;Pain;Dexterity;FMC;ROM;Edema   ? Psychosocial Skills Habits;Environmental  Adaptations;Routines and Behaviors   ? Rehab Potential Good   ? Clinical Decision Making Limited treatment options, no task modification necessary   ?  Comorbidities Affecting Occupational Performance: None   ? Modification or Assistance to Complete Evaluation  No modification of tasks or assist necessary to complete eval   ? OT Frequency 2x / week   ? OT [place]

## 2021-12-20 DIAGNOSIS — Z8781 Personal history of (healed) traumatic fracture: Secondary | ICD-10-CM | POA: Diagnosis not present

## 2021-12-20 DIAGNOSIS — Z9889 Other specified postprocedural states: Secondary | ICD-10-CM | POA: Diagnosis not present

## 2021-12-23 ENCOUNTER — Ambulatory Visit: Payer: PPO | Admitting: Occupational Therapy

## 2021-12-23 DIAGNOSIS — M25642 Stiffness of left hand, not elsewhere classified: Secondary | ICD-10-CM

## 2021-12-23 DIAGNOSIS — M79642 Pain in left hand: Secondary | ICD-10-CM

## 2021-12-23 DIAGNOSIS — M6281 Muscle weakness (generalized): Secondary | ICD-10-CM

## 2021-12-23 DIAGNOSIS — L905 Scar conditions and fibrosis of skin: Secondary | ICD-10-CM

## 2021-12-23 NOTE — Therapy (Signed)
?OUTPATIENT OCCUPATIONAL THERAPY TREATMENT NOTE ? ? ?Patient Name: Christina Foley ?MRN: [id number] ?DOB:Jul 12, 1946, 76 y.o., female ?Today's Date: 12/23/2021 ? ?PCP: Rusty Aus, MD ?REFERRING PROVIDER: Duanne Guess, PA-C ? ?END OF SESSION:  ? OT End of Session - 12/23/21 1403   ? ? Visit Number 4   ? Number of Visits 14   ? Date for OT Re-Evaluation 01/28/22   ? OT Start Time 1345   ? OT Stop Time 1428   ? OT Time Calculation (min) 43 min   ? Activity Tolerance Patient tolerated treatment well   ? Behavior During Therapy [facility] for tasks assessed/performed   ? ?  ?  ? ?  ? ? ?Past Medical History:  ?Diagnosis Date  ? Back pain   ? Cataract cortical, senile, left   ? DDD (degenerative disc disease), cervical   ? Decreased libido   ? Diverticulitis   ? Diverticulosis   ? Environmental allergies   ? seasonal  ? GERD (gastroesophageal reflux disease)   ? Heart murmur   ? 1970s  ? HPV test positive   ? Hyperlipidemia   ? Hypertension   ? Menopausal and postmenopausal disorder   ? Menopause   ? Osteoarthritis   ? Osteopenia   ? Osteoporosis   ? Postmenopausal atrophic vaginitis   ? Rectocele   ? Shoulder pain, right   ? rotator cuff tear  ? Thyroid nodule   ? ?Past Surgical History:  ?Procedure Laterality Date  ? BREAST CYST ASPIRATION Bilateral 2000  ? benign  ? BUNIONECTOMY Left   ? CATARACT EXTRACTION Left   ? COLONOSCOPY    ? 1993, 2005, 2009  ? COLONOSCOPY WITH PROPOFOL N/A 03/31/2018  ? Procedure: COLONOSCOPY WITH PROPOFOL;  Surgeon: Manya Silvas, MD;  Location: [facility] ENDOSCOPY;  Service: Endoscopy;  Laterality: N/A;  ? CRYOTHERAPY    ? ESOPHAGOGASTRODUODENOSCOPY  1993  ? ESOPHAGOGASTRODUODENOSCOPY  05/24/2020  ? ESOPHAGOGASTRODUODENOSCOPY (EGD) WITH PROPOFOL N/A 03/31/2018  ? Procedure: ESOPHAGOGASTRODUODENOSCOPY (EGD) WITH PROPOFOL;  Surgeon: Manya Silvas, MD;  Location: [facility] ENDOSCOPY;  Service: Endoscopy;  Laterality: N/A;  ? OPEN REDUCTION INTERNAL FIXATION (ORIF) PROXIMAL PHALANX Left  11/14/2021  ? Procedure: Left, little finger proximal phalanx ORIF;  Surgeon: Hessie Knows, MD;  Location: [facility] ORS;  Service: Orthopedics;  Laterality: Left;  left little finger  ? SHOULDER ARTHROSCOPY Right 10/28/2017  ? Procedure: ARTHROSCOPY SHOULDER EITH DEBRIDEMENT DECOMPRESSIOIN AND REAPIR OF ROTATOR CUFF REPAIR;  Surgeon: Corky Mull, MD;  Location: [place];  Service: Orthopedics;  Laterality: Right;  ? TOTAL HIP ARTHROPLASTY Right 08/07/2014  ? TUBAL LIGATION    ? ?Patient Active Problem List  ? Diagnosis Date Noted  ? Vaginal atrophy 02/07/2016  ? Vulvitis 02/07/2016  ? Menopause 02/07/2016  ? Osteopenia 02/07/2016  ? HPV test positive 02/07/2016  ? Essential hypertension 02/07/2016  ? Diverticulosis 02/07/2016  ? ? ?ONSET DATE: 11/14/21 ? ?REFERRING DIAG: Left fifth digit ORIF ? ?THERAPY DIAG:  ?Muscle weakness (generalized) ? ?Stiffness of left hand, not elsewhere classified ? ?Pain in left hand ? ?Scar tissue ? ?PERTINENT HISTORY: Christina Foley is a 76 y.o. female here today status post left little finger ORIF by Dr. Hessie Knows on 11/14/2021. Patient is doing well. Pain well controlled. Still having some limitation with flexion and slight limitation with extension ?   ?  ?PRECAUTIONS: NONE ?  ?  ?  ?  ?  ?OBJECTIVE: OBJECTIVE: Treatment note today ?  ?HAND DOMINANCE:  Right ?  ? [facility] OT Assessment - 12/23/21 0001   ? ?  ? Left Hand AROM  ? L Little  MCP 0-90 90 Degrees   ? L Little PIP 0-100 65 Degrees   ? ?  ?  ? ?  ?   ? in session and after PROM AROM block PIP 70 and PROM 80 degrees ?Patient arrived with same active range of motion did see Dr. Rudene Christians past Friday and had x-rays. ?Patient leaving this Friday for [place]. ?Edema did decrease but continue with scar adhesions on lateral side of fifth PIP ?Patient reported get a paraffin bath patient was educated in use of paraffin. ?Paraffin done 8 minutes with patient resting hand in light composite flexion with a heating pad prior to  scar massage and passive and active range of motion pain-free.  Used mini massager and extractor for scar mobs this date. ?Pain under 3/10 during passive range of motion stretches. ?Reviewed with patient and educated on scar massage and Kinesiotape provided to use during her trip at daytime ?She has silicone digital sleeves to wear at nighttime ?Blocked DIP PIP PROM to fifth digit ?Block PIP with composite PIP/DIP flexion and extension in between ?Reviewed and educated patient followed by composite light flexion stretch now to highlighter that will provide her about 75 degrees flexion at PIP during her trip for a visual target ?Patient was able to get 70 degrees PIP flexion and passive range of motion 80 degrees with proximal phalanges blocked ?Patient can continue with buddy strap during her trip ?No strengthening yet focusing on motion ? ?  ?   ?PATIENT EDUCATION: ?Education details:HEP and findings of HEP  ?Person educated: Patient ?Education method: Explanation, Demonstration, Verbal cues, and Handouts ?Education comprehension: verbalized understanding and returned demonstration ?  ?  ?HOME EXERCISE PROGRAM: ?Assess patient's progress with her home exercises while she was 2 weeks on vacation in [place] assess scar adhesions and motion as well as grip and prehension strength.   ? ? ?GOALS: ?Goals reviewed with patient? Yes ?  ?SHORT TERM GOALS: Target date: 12/31/2021 ?  ?Patient to be independent in home program to decrease edema increase scar tissue to increase motion in left fifth digit ?Baseline: No knowledge of scar massage, increase edema and decreased motion ?Goal status: INITIAL ?LONG TERM GOALS: Target date: 01/28/2022 ?  ?Patient left fifth digit flexion active range of motion increase for patient to touch palm to drive and squeeze washcloth ?Baseline: D IP 25, P IP 40, MC80 flexion of ?Goal status: INITIAL ?  ?2.  Patient able to make composite fist with left fifth digit to squeeze washcloth with no  increase symptoms ?Baseline: Unable to touch palm increase scar tissue increase edema, patient only 2-1/2 weeks out ?Goal status: INITIAL ?  ?3.  Patient's left grip strength increased to more than 75% compared to right hand to return to crafts and art activities without increase symptoms ?Baseline: Not tested patient is 2-1/2 weeks out from surgery ?Goal status: INITIAL ?  ?ASSESSMENT: ?  ?CLINICAL IMPRESSION: ?Patient present at OT evaluation with left fifth ORIF of proximal phalanges.  Patient  5 1/2 weeks postop.  Patient returns today after having x-ray done last Friday with Dr. Rudene Christians.  Patient's active range of motion the same the last 2 visits.  The session fifth MC flexion was 90, walked in 60 degrees flexion of PIP during session after passive range of motion had 70 degrees, passive range of motion 80 with pain less than  3/10.  With extension of PIP in between.  Reviewed with patient home exercises to do 2 weeks while on a trip to [place] on passive range of motion DIP block passive range of motion of PIP with composite PIP/DIP flexion.  Then provided her visual using a highlighter for composite flexion that would provide her about a 75 degrees of PIP flexion.  Reinforced importance of extension between flexion.  Patient continues to have scar adhesion on lateral PIP and proximal phalanges on fifth-patient to focus on scar massage.  Patient can continue to benefit from OT services to decrease scar tissue, decrease edema and increase range of motion and strength.   ?  ?PERFORMANCE DEFICITS in functional skills including ADLs, IADLs, edema, ROM, strength, pain, flexibility, and UE functional use, ,  ?IMPAIRMENTS are limiting patient from ADLs, IADLs, play, leisure, and social participation.  ?  ?COMORBIDITIES has no other co-morbidities that affects occupational performance. Patient will benefit from skilled OT to address above impairments and improve overall function. ?  ?MODIFICATION OR ASSISTANCE  TO COMPLETE EVALUATION: No modification of tasks or assist necessary to complete an evaluation. ?  ?OT OCCUPATIONAL PROFILE AND HISTORY: Problem focused assessment: Including review of records relating to pres

## 2021-12-24 ENCOUNTER — Ambulatory Visit: Payer: PPO | Admitting: Occupational Therapy

## 2021-12-24 DIAGNOSIS — M48062 Spinal stenosis, lumbar region with neurogenic claudication: Secondary | ICD-10-CM | POA: Diagnosis not present

## 2021-12-24 DIAGNOSIS — M5416 Radiculopathy, lumbar region: Secondary | ICD-10-CM | POA: Diagnosis not present

## 2021-12-26 ENCOUNTER — Ambulatory Visit: Payer: PPO | Admitting: Occupational Therapy

## 2021-12-26 DIAGNOSIS — L905 Scar conditions and fibrosis of skin: Secondary | ICD-10-CM

## 2021-12-26 DIAGNOSIS — M25642 Stiffness of left hand, not elsewhere classified: Secondary | ICD-10-CM

## 2021-12-26 DIAGNOSIS — M6281 Muscle weakness (generalized): Secondary | ICD-10-CM

## 2021-12-26 DIAGNOSIS — M79642 Pain in left hand: Secondary | ICD-10-CM

## 2021-12-26 NOTE — Therapy (Signed)
[facility] ?[address] ?[place], [place], [address] ?Phone: [phone]   Fax:  [phone] ? ?Occupational Therapy Treatment ? ?Patient Details  ?Name: Christina Foley ?MRN: [id number] ?Date of Birth: 01-Sep-1946 ?No data recorded ? ?Encounter Date: 12/26/2021 ? ? OT End of Session - 12/26/21 0948   ? ? Visit Number 5   ? Number of Visits 14   ? Date for OT Re-Evaluation 01/28/22   ? OT Start Time [phone]   ? OT Stop Time 1028   ? OT Time Calculation (min) 40 min   ? Activity Tolerance Patient tolerated treatment well   ? Behavior During Therapy [facility] for tasks assessed/performed   ? ?  ?  ? ?  ? ? ?Past Medical History:  ?Diagnosis Date  ? Back pain   ? Cataract cortical, senile, left   ? DDD (degenerative disc disease), cervical   ? Decreased libido   ? Diverticulitis   ? Diverticulosis   ? Environmental allergies   ? seasonal  ? GERD (gastroesophageal reflux disease)   ? Heart murmur   ? 1970s  ? HPV test positive   ? Hyperlipidemia   ? Hypertension   ? Menopausal and postmenopausal disorder   ? Menopause   ? Osteoarthritis   ? Osteopenia   ? Osteoporosis   ? Postmenopausal atrophic vaginitis   ? Rectocele   ? Shoulder pain, right   ? rotator cuff tear  ? Thyroid nodule   ? ? ?Past Surgical History:  ?Procedure Laterality Date  ? BREAST CYST ASPIRATION Bilateral 2000  ? benign  ? BUNIONECTOMY Left   ? CATARACT EXTRACTION Left   ? COLONOSCOPY    ? 1993, 2005, 2009  ? COLONOSCOPY WITH PROPOFOL N/A 03/31/2018  ? Procedure: COLONOSCOPY WITH PROPOFOL;  Surgeon: Manya Silvas, MD;  Location: [facility] ENDOSCOPY;  Service: Endoscopy;  Laterality: N/A;  ? CRYOTHERAPY    ? ESOPHAGOGASTRODUODENOSCOPY  1993  ? ESOPHAGOGASTRODUODENOSCOPY  05/24/2020  ? ESOPHAGOGASTRODUODENOSCOPY (EGD) WITH PROPOFOL N/A 03/31/2018  ? Procedure: ESOPHAGOGASTRODUODENOSCOPY (EGD) WITH PROPOFOL;  Surgeon: Manya Silvas, MD;  Location: [facility] ENDOSCOPY;  Service: Endoscopy;   Laterality: N/A;  ? OPEN REDUCTION INTERNAL FIXATION (ORIF) PROXIMAL PHALANX Left 11/14/2021  ? Procedure: Left, little finger proximal phalanx ORIF;  Surgeon: Hessie Knows, MD;  Location: [facility] ORS;  Service: Orthopedics;  Laterality: Left;  left little finger  ? SHOULDER ARTHROSCOPY Right 10/28/2017  ? Procedure: ARTHROSCOPY SHOULDER EITH DEBRIDEMENT DECOMPRESSIOIN AND REAPIR OF ROTATOR CUFF REPAIR;  Surgeon: Corky Mull, MD;  Location: [place];  Service: Orthopedics;  Laterality: Right;  ? TOTAL HIP ARTHROPLASTY Right 08/07/2014  ? TUBAL LIGATION    ? ? ?There were no vitals filed for this visit. ? ? Subjective Assessment - 12/26/21 0949   ? ? Subjective  This shot did not work yet for my hip and my back pain so I do not know we flying to [place].  My pinky has been a little bit small for and this morning there was little more redness at the joint.   ? Pertinent History MAHLET Foley is a 76 y.o. female here today status post left little finger ORIF by Dr. Hessie Knows on 11/14/2021. Patient is doing well. Pain well controlled. Still having some limitation with flexion and slight limitation with extension   ? Patient Stated Goals Want my motion and strength back in L hand to return to what I was doing   ?  Currently in Pain? Yes   ? Pain Score 4    ? Pain Location Finger (Comment which one)   ? Pain Orientation Left   ? Pain Descriptors / Indicators Tightness;Tender;Aching   ? Pain Type Surgical pain   ? Pain Onset More than a month ago   ? Pain Frequency Intermittent   ? ?  ?  ? ?  ? ? ? ? ? [facility] OT Assessment - 12/26/21 0001   ? ?  ? Left Hand AROM  ? L Little  MCP 0-90 90 Degrees   ? L Little PIP 0-100 65 Degrees   ? ?  ?  ? ?  ? ? ? ? ? ? ? ? ? ? ? OT Treatments/Exercises (OP) - 12/26/21 0001   ? ?  ? LUE Paraffin  ? Number Minutes Paraffin 8 Minutes   ? LUE Paraffin Location Hand   ? Comments increase ROM, decrease stiffness   ? ?  ?  ? ?  ? ?in session and after PROM AROM block PIP 70 and  PROM 80 degrees ?Patient arrived with same active range of motion did see Dr. Rudene Christians past Friday and had x-rays. ?Patient leaving this Friday for [place]. ?Edema did decrease to 0.5 cm  but continue with scar adhesions on lateral side of fifth PIP ?Patient reported get a paraffin bath patient was educated in use of paraffin last time ?Paraffin done 8 minutes with patient resting position to decrease soreness with a heating pad prior to scar massage and passive and active range of motion pain-free.  Used mini massager and extractor for scar mobs this date. ?Pain under 3/10 during passive range of motion stretches at home - in session was 4-5/10 . ?Reviewed with patient and educated on scar massage and Kinesiotape provided to use during her trip at daytime ?She has silicone digital sleeves to wear at nighttime ?Blocked DIP PIP PROM to fifth digit ?Block PIP with composite PIP/DIP flexion and extension in between ?Reviewed and educated patient followed by composite light flexion stretch now to highlighter that will provide her about 75 degrees flexion at PIP during her trip for a visual target ?Patient was able to get 70 degrees PIP flexion and passive range of motion 80 degrees with proximal phalanges blocked ?Patient can continue with buddy strap during her trip ?No strengthening yet focusing on motion ?  ?  ?  ?  ?  ?GOALS: ?Goals reviewed with patient? Yes ?  ?SHORT TERM GOALS: Target date: 12/31/2021 ?  ?Patient to be independent in home program to decrease edema increase scar tissue to increase motion in left fifth digit ?Baseline: No knowledge of scar massage, increase edema and decreased motion ?Goal status: INITIAL ?LONG TERM GOALS: Target date: 01/28/2022 ?  ?Patient left fifth digit flexion active range of motion increase for patient to touch palm to drive and squeeze washcloth ?Baseline: D IP 25, P IP 40, MC80 flexion of ?Goal status: INITIAL ?  ?2.  Patient able to make composite fist with left fifth digit to  squeeze washcloth with no increase symptoms ?Baseline: Unable to touch palm increase scar tissue increase edema, patient only 2-1/2 weeks out ?Goal status: INITIAL ?  ?3.  Patient's left grip strength increased to more than 75% compared to right hand to return to crafts and art activities without increase symptoms ?Baseline: Not tested patient is 2-1/2 weeks out from surgery ?Goal status: INITIAL ?  ?ASSESSMENT: ? ? ? ? ? ? ? OT Education -  12/26/21 1034   ? ? Education Details HEP, scar massage   ? Person(s) Educated Patient   ? Methods Explanation;Demonstration;Verbal cues;Handout   ? Comprehension Verbalized understanding;Returned demonstration;Verbal cues required   ? ?  ?  ? ?  ? ? ? ? ? ? ? ? ? ? ? ? ? Plan - 12/26/21 1030   ? ? Clinical Impression Statement Patient s/p with left fifth ORIF of proximal phalanges on 11/14/21-  6  weeks post . Patient's active range of motion caming in at fifth MC flexion was 90, walked in 65 degrees flexion of PIP , during session after passive range of motion had 70 degrees AROM , passive range of motion 80 with  but pain was about 4-5/10 after paraffin.  With extension of PIP in between.  Reviewed with patient home exercises to do 2 weeks while on a trip to [place] if going - she is having some hip and back pain -focusing on passive range of motion DIP block passive range of motion of PIP with composite PIP/DIP flexion.  Then provided her visual using a highlighter for composite flexion that would provide her about a 75 degrees of PIP flexion.  Reinforced importance of extension between flexion.  Patient continues to have scar adhesion on lateral PIP and proximal phalanges on fifth-patient to focus on scar massage.  Patient can continue to benefit from OT services to decrease scar tissue, decrease edema and increase range of motion and strength.   ? OT Occupational Profile and History Problem Focused Assessment - Including review of records relating to presenting problem   ?  Occupational performance deficits (Please refer to evaluation for details): ADL's;IADL's;Leisure   ? Body Structure / Function / Physical Skills ADL;Scar mobility;UE functional use;IADL;Pain;Dexterity;FMC

## 2022-01-02 ENCOUNTER — Ambulatory Visit: Payer: PPO | Admitting: Occupational Therapy

## 2022-01-02 DIAGNOSIS — M79642 Pain in left hand: Secondary | ICD-10-CM

## 2022-01-02 DIAGNOSIS — L905 Scar conditions and fibrosis of skin: Secondary | ICD-10-CM | POA: Diagnosis not present

## 2022-01-02 DIAGNOSIS — M25642 Stiffness of left hand, not elsewhere classified: Secondary | ICD-10-CM

## 2022-01-02 DIAGNOSIS — M6281 Muscle weakness (generalized): Secondary | ICD-10-CM

## 2022-01-02 NOTE — Therapy (Signed)
[place] [facility] ?[address] ?[place], [place], [address] ?Phone: [phone]   Fax:  [phone] ? ?Occupational Therapy Treatment ? ?Patient Details  ?Name: Christina Foley ?MRN: [id number] ?Date of Birth: 15-Dec-1945 ?No data recorded ? ?Encounter Date: 01/02/2022 ? ? OT End of Session - 01/02/22 1702   ? ? Visit Number 6   ? Number of Visits 14   ? Date for OT Re-Evaluation 01/28/22   ? OT Start Time 1405   ? OT Stop Time 4403   ? OT Time Calculation (min) 39 min   ? Activity Tolerance Patient tolerated treatment well   ? Behavior During Therapy [facility] for tasks assessed/performed   ? ?  ?  ? ?  ? ? ?Past Medical History:  ?Diagnosis Date  ? Back pain   ? Cataract cortical, senile, left   ? DDD (degenerative disc disease), cervical   ? Decreased libido   ? Diverticulitis   ? Diverticulosis   ? Environmental allergies   ? seasonal  ? GERD (gastroesophageal reflux disease)   ? Heart murmur   ? 1970s  ? HPV test positive   ? Hyperlipidemia   ? Hypertension   ? Menopausal and postmenopausal disorder   ? Menopause   ? Osteoarthritis   ? Osteopenia   ? Osteoporosis   ? Postmenopausal atrophic vaginitis   ? Rectocele   ? Shoulder pain, right   ? rotator cuff tear  ? Thyroid nodule   ? ? ?Past Surgical History:  ?Procedure Laterality Date  ? BREAST CYST ASPIRATION Bilateral 2000  ? benign  ? BUNIONECTOMY Left   ? CATARACT EXTRACTION Left   ? COLONOSCOPY    ? 1993, 2005, 2009  ? COLONOSCOPY WITH PROPOFOL N/A 03/31/2018  ? Procedure: COLONOSCOPY WITH PROPOFOL;  Surgeon: Manya Silvas, MD;  Location: [facility] ENDOSCOPY;  Service: Endoscopy;  Laterality: N/A;  ? CRYOTHERAPY    ? ESOPHAGOGASTRODUODENOSCOPY  1993  ? ESOPHAGOGASTRODUODENOSCOPY  05/24/2020  ? ESOPHAGOGASTRODUODENOSCOPY (EGD) WITH PROPOFOL N/A 03/31/2018  ? Procedure: ESOPHAGOGASTRODUODENOSCOPY (EGD) WITH PROPOFOL;  Surgeon: Manya Silvas, MD;  Location: [facility] ENDOSCOPY;  Service: Endoscopy;   Laterality: N/A;  ? OPEN REDUCTION INTERNAL FIXATION (ORIF) PROXIMAL PHALANX Left 11/14/2021  ? Procedure: Left, little finger proximal phalanx ORIF;  Surgeon: Hessie Knows, MD;  Location: [facility] ORS;  Service: Orthopedics;  Laterality: Left;  left little finger  ? SHOULDER ARTHROSCOPY Right 10/28/2017  ? Procedure: ARTHROSCOPY SHOULDER EITH DEBRIDEMENT DECOMPRESSIOIN AND REAPIR OF ROTATOR CUFF REPAIR;  Surgeon: Corky Mull, MD;  Location: [place];  Service: Orthopedics;  Laterality: Right;  ? TOTAL HIP ARTHROPLASTY Right 08/07/2014  ? TUBAL LIGATION    ? ? ?There were no vitals filed for this visit. ? ? Subjective Assessment - 01/02/22 1658   ? ? Subjective  The shot did not work for my hip so we canceled her trip to [place] last Friday morning.  Did my exercise to my finger.   ? Pertinent History Christina Foley is a 76 y.o. female here today status post left little finger ORIF by Dr. Hessie Knows on 11/14/2021. Patient is doing well. Pain well controlled. Still having some limitation with flexion and slight limitation with extension   ? Patient Stated Goals Want my motion and strength back in L hand to return to what I was doing   ? Currently in Pain? Yes   ? Pain Score 4    ? Pain Location  Finger (Comment which one)   ? Pain Orientation Left   ? Pain Descriptors / Indicators Aching;Tender;Tightness   ? Pain Type Surgical pain   ? Pain Onset More than a month ago   ? ?  ?  ? ?  ? ? ? ? ? ?  Patient arrived with 65 degrees of fifth PIP flexion, MC at 90, ? ? ? ? ? ? ? ? ? OT Treatments/Exercises (OP) - 01/02/22 0001   ? ?  ? LUE Paraffin  ? Number Minutes Paraffin 8 Minutes   ? LUE Paraffin Location Hand   ? Comments increase motion - light coban flexion wrap 5 min   ? ?  ?  ? ?  ? ? ? ? ? ? ? ? ? OT Education - 01/02/22 1702   ? ? Education Details HEP, scar massage   ? Person(s) Educated Patient   ? Methods Explanation;Demonstration;Verbal cues;Handout   ? Comprehension Verbalized  understanding;Returned demonstration;Verbal cues required   ? ?  ?  ? ?  ? ? ? ?in session and after PROM AROM block PIP 70-75 and PROM 80 degrees ?Patient arrived with same active range of motion  ?Edema did decrease to 0.5 cm  but continue with scar adhesions on lateral side of fifth PIP ?Patient reported get a paraffin bath patient was educated in use of paraffin last time ?Paraffin done 8 minutes with patient fifth digit and slight Coban flexion wrap with a heating pad around.  Done gentle passive range of motion to DIP, PIP as well as composite with MC block at 90 degrees several times.   ?Then was able to do intrinsic a light stretching heating pad on fifth digit  ?Marland Kitchen  With highlighter and pain blocking MC at 90 degrees was able to facilitate PIP active flexion 70-75 degrees and passive range of motion 80 degrees.   ?Reinforced with patient to focus on blocking MC and using highlighter to facilitate 75 to 80 degrees of flexion at PIP on fifth at home.   ? ?Pain under 3/10 during passive range of motion stretches at home - in session was 4-5/10 . ?Reviewed with patient and educated on scar massage  ?She has silicone digital sleeves to wear at nighttime ?Patient can continue with buddy strap during the day  ?no strengthening yet focusing on motion ?  ? ? ? ? ? ? ? ? ? Plan - 01/02/22 1702   ? ? Clinical Impression Statement Patient s/p with left fifth ORIF of proximal phalanges on 11/14/21-  7  weeks post . Patient's active range of motion caming in at fifth MC flexion was 90, walked in 65 degrees flexion of PIP , during session after passive range of motion had 75 degrees AROM , passive range of motion 80 with  pain was about 4-5/10 after paraffin.  With extension of PIP in between.  Reviewed with patient home exercises to do 2 a day.Focusing on passive range of motion DIP block passive range of motion and with composite PIP/DIP flexion.  Then provided her visual using a highlighter for composite flexion that  would provide her about a 75-80 degrees of PIP flexion.  Patient to use highlighter during her heating pad to provide her with a light passive stretch at 70 to 80 degrees of flexion at fifth PIP.  Reinforced importance of extension between flexion.  Patient continues to have scar adhesion on lateral PIP and proximal phalanges on fifth-patient to focus on scar massage.  Patient  can continue to benefit from OT services to decrease scar tissue, decrease edema and increase range of motion and strength.   ? OT Occupational Profile and History Problem Focused Assessment - Including review of records relating to presenting problem   ? Occupational performance deficits (Please refer to evaluation for details): ADL's;IADL's;Leisure   ? Body Structure / Function / Physical Skills ADL;Scar mobility;UE functional use;IADL;Pain;Dexterity;FMC;ROM;Edema   ? Psychosocial Skills Habits;Environmental  Adaptations;Routines and Behaviors   ? Rehab Potential Good   ? Clinical Decision Making Limited treatment options, no task modification necessary   ? Comorbidities Affecting Occupational Performance: None   ? Modification or Assistance to Complete Evaluation  No modification of tasks or assist necessary to complete eval   ? OT Frequency 2x / week   ? OT Duration 8 weeks   ? OT Treatment/Interventions Self-care/ADL training;Cryotherapy;Paraffin;Therapeutic exercise;Fluidtherapy;Neuromuscular education;Manual Therapy;Scar mobilization;Splinting;Moist Heat;Contrast Bath;Energy conservation;Passive range of motion;Therapeutic activities;Patient/family education   ? Consulted and Agree with Plan of Care Patient   ? ?  ?  ? ?  ? ? ?Patient will benefit from skilled therapeutic intervention in order to improve the following deficits and impairments:   ?Body Structure / Function / Physical Skills: ADL, Scar mobility, UE functional use, IADL, Pain, Dexterity, FMC, ROM, Edema ?  ?Psychosocial Skills: Habits, Environmental  Adaptations, Routines  and Behaviors ? ? ?Visit Diagnosis: ?Muscle weakness (generalized) ? ?Stiffness of left hand, not elsewhere classified ? ?Pain in left hand ? ?Scar tissue ? ? ? ?Problem List ?Patient Active Problem List  ? Diagnosis Date N

## 2022-01-07 ENCOUNTER — Ambulatory Visit: Payer: PPO | Attending: Orthopedic Surgery | Admitting: Occupational Therapy

## 2022-01-07 DIAGNOSIS — M25642 Stiffness of left hand, not elsewhere classified: Secondary | ICD-10-CM | POA: Insufficient documentation

## 2022-01-07 DIAGNOSIS — M6281 Muscle weakness (generalized): Secondary | ICD-10-CM | POA: Insufficient documentation

## 2022-01-07 DIAGNOSIS — L905 Scar conditions and fibrosis of skin: Secondary | ICD-10-CM | POA: Insufficient documentation

## 2022-01-07 DIAGNOSIS — M79642 Pain in left hand: Secondary | ICD-10-CM | POA: Diagnosis not present

## 2022-01-07 NOTE — Therapy (Signed)
[place] [facility] [facility] ?[address] ?[place], [place], [address] ?Phone: [phone]   Fax:  [phone] ? ?Occupational Therapy Treatment ? ?Patient Details  ?Name: Christina Foley ?MRN: [id number] ?Date of Birth: [birth year] ?No data recorded ? ?Encounter Date: 01/07/2022 ? ? OT End of Session - 01/07/22 1400   ? ? Visit Number 7   ? Number of Visits 14   ? Date for OT Re-Evaluation 01/28/22   ? OT Start Time 1402   ? OT Stop Time 1437   ? OT Time Calculation (min) 35 min   ? Activity Tolerance Patient tolerated treatment well   ? Behavior During Therapy [facility] for tasks assessed/performed   ? ?  ?  ? ?  ? ? ?Past Medical History:  ?Diagnosis Date  ? Back pain   ? Cataract cortical, senile, left   ? DDD (degenerative disc disease), cervical   ? Decreased libido   ? Diverticulitis   ? Diverticulosis   ? Environmental allergies   ? seasonal  ? GERD (gastroesophageal reflux disease)   ? Heart murmur   ? 1970s  ? HPV test positive   ? Hyperlipidemia   ? Hypertension   ? Menopausal and postmenopausal disorder   ? Menopause   ? Osteoarthritis   ? Osteopenia   ? Osteoporosis   ? Postmenopausal atrophic vaginitis   ? Rectocele   ? Shoulder pain, right   ? rotator cuff tear  ? Thyroid nodule   ? ? ?Past Surgical History:  ?Procedure Laterality Date  ? BREAST CYST ASPIRATION Bilateral 2000  ? benign  ? BUNIONECTOMY Left   ? CATARACT EXTRACTION Left   ? COLONOSCOPY    ? 1993, 2005, 2009  ? COLONOSCOPY WITH PROPOFOL N/A 03/31/2018  ? Procedure: COLONOSCOPY WITH PROPOFOL;  Surgeon: Manya Silvas, MD;  Location: [facility] ENDOSCOPY;  Service: Endoscopy;  Laterality: N/A;  ? CRYOTHERAPY    ? ESOPHAGOGASTRODUODENOSCOPY  1993  ? ESOPHAGOGASTRODUODENOSCOPY  05/24/2020  ? ESOPHAGOGASTRODUODENOSCOPY (EGD) WITH PROPOFOL N/A 03/31/2018  ? Procedure: ESOPHAGOGASTRODUODENOSCOPY (EGD) WITH PROPOFOL;  Surgeon: Manya Silvas, MD;  Location: [facility] ENDOSCOPY;  Service: Endoscopy;   Laterality: N/A;  ? OPEN REDUCTION INTERNAL FIXATION (ORIF) PROXIMAL PHALANX Left 11/14/2021  ? Procedure: Left, little finger proximal phalanx ORIF;  Surgeon: Hessie Knows, MD;  Location: [facility] ORS;  Service: Orthopedics;  Laterality: Left;  left little finger  ? SHOULDER ARTHROSCOPY Right 10/28/2017  ? Procedure: ARTHROSCOPY SHOULDER EITH DEBRIDEMENT DECOMPRESSIOIN AND REAPIR OF ROTATOR CUFF REPAIR;  Surgeon: Corky Mull, MD;  Location: [place];  Service: Orthopedics;  Laterality: Right;  ? TOTAL HIP ARTHROPLASTY Right 08/07/2014  ? TUBAL LIGATION    ? ? ?There were no vitals filed for this visit. ? ? Subjective Assessment - 01/07/22 1400   ? ? Subjective  It just feel tight no pain really - but feels like it is stuck and the hardware in the way - tyring to use it - can type on on computer - did not try gripping or pick up things   ? Pertinent History PANTERA WINTERROWD is a 76 y.o. female here today status post left little finger ORIF by Dr. Hessie Knows on 11/14/2021. Patient is doing well. Pain well controlled. Still having some limitation with flexion and slight limitation with extension   ? Patient Stated Goals Want my motion and strength back in L hand to return to what I was doing   ? Currently  in Pain? No/denies   ? ?  ?  ? ?  ? ? ? ? ? ?PIP flexion 65, coming in at 5th - and PROM 80 in session and AROM in session 70-75 degrees  ? ? ? ? ? ? ? ? ? OT Treatments/Exercises (OP) - 01/07/22 0001   ? ?  ? LUE Paraffin  ? Number Minutes Paraffin 8 Minutes   ? LUE Paraffin Location Hand   ? Comments increase flexin- light flexion coban wrap   ? ?  ?  ? ?  ? ? ?in session and after PROM  her AROM block PIP 70-75 and PROM 80 degrees ?Patient arrived with same active range of motion as the last few wks ?Edema did decrease to 0.5 cm  but continue with scar adhesions on lateral side of fifth PIP ?Patient reported get a paraffin bath patient was educated in use of paraffin last time ?Paraffin done 8  minutes with patient fifth digit and slight Coban flexion wrap with a heating pad around.  Done gentle passive range of motion to DIP, PIP as well as composite with MC block at 90 degrees several times.   ?Then was able to do intrinsic a light stretching heating pad on fifth digit  ?Marland Kitchen  With highlighter and pain blocking MC at 90 degrees was able to facilitate PIP active flexion 70-75 degrees and passive range of motion 80 degrees.   ?Reinforced with patient to focus on blocking MC and using highlighter to facilitate 75 to 80 degrees of flexion at PIP on fifth at home.   ?Add med pink foam block for pt in grip/squeeze into  4 cm wide part and then down touching it on narrow side 2 cm  ?  ?Pain under 3/10 during passive range of motion stretches at home - in session was 3-4/10  today. ?Reviewed with patient and educated on scar massage  ?She has silicone digital sleeves to wear at nighttime ?Patient can continue with buddy strap during the day to align 5th digit- prevent crossing over 4th  ? ?  ?  ? ? ? ? ? ? OT Education - 01/07/22 1400   ? ? Education Details HEP, scar massage   ? Person(s) Educated Patient   ? Methods Explanation;Demonstration;Verbal cues;Handout   ? Comprehension Verbalized understanding;Returned demonstration;Verbal cues required   ? ?  ?  ? ?  ? ? ? ? ? ? ? ? ? ? ? ? ? Plan - 01/07/22 1401   ? ? Clinical Impression Statement Patient s/p with left fifth ORIF of proximal phalanges on 11/14/21-  8  weeks post . Patient's active range of motion caming in at fifth MC flexion was 90, walked in 65 degrees flexion of PIP , during session after passive range of motion had 70- 75 degrees AROM , passive range of motion 80 with  pain less today - 3-4/10 after paraffin.  With extension of PIP in between.  Reviewed with patient home exercises to do 2 a day using her paraffin at home. Focusing on passive range of motion DIP passive range of motion and with composite PIP/DIP flexion.  Then provided her visual  using a highlighter for composite flexion that would provide her about a 75-80 degrees of PIP flexion.  Add med foam block for pt to grip into after ROM.   Reinforced importance of extension between flexion.  Patient continues to have scar adhesion on lateral PIP and proximal phalanges on fifth-patient to focus on scar  massage and ROM.  Patient can continue to benefit from OT services to decrease scar tissue, decrease edema and increase range of motion and strength.   ? OT Occupational Profile and History Problem Focused Assessment - Including review of records relating to presenting problem   ? Occupational performance deficits (Please refer to evaluation for details): ADL's;IADL's;Leisure   ? Body Structure / Function / Physical Skills ADL;Scar mobility;UE functional use;IADL;Pain;Dexterity;FMC;ROM;Edema   ? Psychosocial Skills Habits;Environmental  Adaptations;Routines and Behaviors   ? Rehab Potential Good   ? Clinical Decision Making Limited treatment options, no task modification necessary   ? Comorbidities Affecting Occupational Performance: None   ? Modification or Assistance to Complete Evaluation  No modification of tasks or assist necessary to complete eval   ? OT Frequency 2x / week   ? OT Duration 8 weeks   ? OT Treatment/Interventions Self-care/ADL training;Cryotherapy;Paraffin;Therapeutic exercise;Fluidtherapy;Neuromuscular education;Manual Therapy;Scar mobilization;Splinting;Moist Heat;Contrast Bath;Energy conservation;Passive range of motion;Therapeutic activities;Patient/family education   ? Consulted and Agree with Plan of Care Patient   ? ?  ?  ? ?  ? ? ?Patient will benefit from skilled therapeutic intervention in order to improve the following deficits and impairments:   ?Body Structure / Function / Physical Skills: ADL, Scar mobility, UE functional use, IADL, Pain, Dexterity, FMC, ROM, Edema ?  ?Psychosocial Skills: Habits, Environmental  Adaptations, Routines and Behaviors ? ? ?Visit  Diagnosis: ?Muscle weakness (generalized) ? ?Stiffness of left hand, not elsewhere classified ? ?Pain in left hand ? ?Scar tissue ? ? ? ?Problem List ?Patient Active Problem List  ? Diagnosis Date Noted  ? Vaginal atrophy 0

## 2022-01-08 ENCOUNTER — Encounter: Payer: PPO | Admitting: Occupational Therapy

## 2022-01-08 DIAGNOSIS — M5416 Radiculopathy, lumbar region: Secondary | ICD-10-CM | POA: Diagnosis not present

## 2022-01-08 DIAGNOSIS — M48062 Spinal stenosis, lumbar region with neurogenic claudication: Secondary | ICD-10-CM | POA: Diagnosis not present

## 2022-01-13 ENCOUNTER — Ambulatory Visit
Admission: RE | Admit: 2022-01-13 | Discharge: 2022-01-13 | Disposition: A | Discharge status: Home / Self Care | Payer: PPO | Source: Ambulatory Visit | Attending: Internal Medicine | Admitting: Internal Medicine

## 2022-01-13 ENCOUNTER — Ambulatory Visit: Payer: PPO | Admitting: Occupational Therapy

## 2022-01-13 DIAGNOSIS — M79642 Pain in left hand: Secondary | ICD-10-CM

## 2022-01-13 DIAGNOSIS — L905 Scar conditions and fibrosis of skin: Secondary | ICD-10-CM

## 2022-01-13 DIAGNOSIS — Z1231 Encounter for screening mammogram for malignant neoplasm of breast: Secondary | ICD-10-CM | POA: Insufficient documentation

## 2022-01-13 DIAGNOSIS — M6281 Muscle weakness (generalized): Secondary | ICD-10-CM | POA: Diagnosis not present

## 2022-01-13 DIAGNOSIS — M25642 Stiffness of left hand, not elsewhere classified: Secondary | ICD-10-CM

## 2022-01-13 NOTE — Therapy (Signed)
[facility] ?[address] ?[place], [place], [address] ?Phone: [phone]   Fax:  [phone] ? ?Occupational Therapy Treatment ? ?Patient Details  ?Name: Christina Foley ?MRN: [id number] ?Date of Birth: 1946-07-31 ?No data recorded ? ?Encounter Date: 01/13/2022 ? ? OT End of Session - 01/13/22 0947   ? ? Visit Number 8   ? Number of Visits 14   ? Date for OT Re-Evaluation 01/28/22   ? OT Start Time [phone]   ? OT Stop Time 1020   ? OT Time Calculation (min) 33 min   ? Activity Tolerance Patient tolerated treatment well   ? Behavior During Therapy [facility] for tasks assessed/performed   ? ?  ?  ? ?  ? ? ?Past Medical History:  ?Diagnosis Date  ? Back pain   ? Cataract cortical, senile, left   ? DDD (degenerative disc disease), cervical   ? Decreased libido   ? Diverticulitis   ? Diverticulosis   ? Environmental allergies   ? seasonal  ? GERD (gastroesophageal reflux disease)   ? Heart murmur   ? 1970s  ? HPV test positive   ? Hyperlipidemia   ? Hypertension   ? Menopausal and postmenopausal disorder   ? Menopause   ? Osteoarthritis   ? Osteopenia   ? Osteoporosis   ? Postmenopausal atrophic vaginitis   ? Rectocele   ? Shoulder pain, right   ? rotator cuff tear  ? Thyroid nodule   ? ? ?Past Surgical History:  ?Procedure Laterality Date  ? BREAST CYST ASPIRATION Bilateral 2000  ? benign  ? BUNIONECTOMY Left   ? CATARACT EXTRACTION Left   ? COLONOSCOPY    ? 1993, 2005, 2009  ? COLONOSCOPY WITH PROPOFOL N/A 03/31/2018  ? Procedure: COLONOSCOPY WITH PROPOFOL;  Surgeon: Manya Silvas, MD;  Location: [facility] ENDOSCOPY;  Service: Endoscopy;  Laterality: N/A;  ? CRYOTHERAPY    ? ESOPHAGOGASTRODUODENOSCOPY  1993  ? ESOPHAGOGASTRODUODENOSCOPY  05/24/2020  ? ESOPHAGOGASTRODUODENOSCOPY (EGD) WITH PROPOFOL N/A 03/31/2018  ? Procedure: ESOPHAGOGASTRODUODENOSCOPY (EGD) WITH PROPOFOL;  Surgeon: Manya Silvas, MD;  Location: [facility] ENDOSCOPY;  Service: Endoscopy;   Laterality: N/A;  ? OPEN REDUCTION INTERNAL FIXATION (ORIF) PROXIMAL PHALANX Left 11/14/2021  ? Procedure: Left, little finger proximal phalanx ORIF;  Surgeon: Hessie Knows, MD;  Location: [facility] ORS;  Service: Orthopedics;  Laterality: Left;  left little finger  ? SHOULDER ARTHROSCOPY Right 10/28/2017  ? Procedure: ARTHROSCOPY SHOULDER EITH DEBRIDEMENT DECOMPRESSIOIN AND REAPIR OF ROTATOR CUFF REPAIR;  Surgeon: Corky Mull, MD;  Location: [place];  Service: Orthopedics;  Laterality: Right;  ? TOTAL HIP ARTHROPLASTY Right 08/07/2014  ? TUBAL LIGATION    ? ? ?There were no vitals filed for this visit. ? ? Subjective Assessment - 01/13/22 0947   ? ? Subjective  I did the exercises and felt I could bend little better.  Yes that I could get only 1 session in.  I worked on scar but stay stuck in that 1 area.   ? Pertinent History CLEMMA JOHNSEN is a 76 y.o. female here today status post left little finger ORIF by Dr. Hessie Knows on 11/14/2021. Patient is doing well. Pain well controlled. Still having some limitation with flexion and slight limitation with extension   ? Patient Stated Goals Want my motion and strength back in L hand to return to what I was doing   ? Currently in Pain? No/denies   ? ?  ?  ? ?  ? ? ? ? ?  [facility] OT Assessment - 01/13/22 0001   ? ?  ? Left Hand AROM  ? L Little  MCP 0-90 90 Degrees   ? L Little PIP 0-100 70 Degrees   PROM 80 in session  ? L Little DIP 0-70 40 Degrees   55 in session  ? ?  ?  ? ?  ? ? ? ? ? ? ? ? ? ? ? OT Treatments/Exercises (OP) - 01/13/22 0001   ? ?  ? LUE Paraffin  ? Number Minutes Paraffin 8 Minutes   ? LUE Paraffin Location Hand   ? Comments light flexion wrap from coban DIP/PIP flexion   ? ?  ?  ? ?  ? ? ?Paraffin done 8 minutes with patient fifth digit  Coban flexion wrap and DIP/PIP flexion stretch with a heating pad around.  Done gentle passive range of motion to DIP, PIP  and intrinsic fist - with prolonged flexion stretch  ?As well as composite with  MC block at 90 degrees several times.   ? ?.  With high lighter blocking MC at 90 degrees was able to facilitate PIP active flexion 70-75 degrees and passive range of motion 80 degrees.  ?In between focus on PIP extension with MC at 90 degree flexion in the lumbrical fist position.   ?Reinforced with patient to focus on blocking MC and using highlighter to facilitate 75 to 80 degrees of flexion at PIP on fifth at home.   ?Add med pink foam block  last time for pt in grip/squeeze into  4 cm wide part and then down touching it on narrow side 2 cm  ?  ?Pain under 3/10 during passive range of motion stretches at home - in session was 3-4/10  today. ?Reviewed with patient and educated on scar massage  ?She has silicone digital sleeves to wear at nighttime ?Patient can continue with buddy strap during the day to align 5th digit- prevent crossing over 4th ? ? ? ? ? ? OT Education - 01/13/22 0947   ? ? Education Details HEP, scar massage   ? Person(s) Educated Patient   ? Methods Explanation;Demonstration;Verbal cues;Handout   ? Comprehension Verbalized understanding;Returned demonstration;Verbal cues required   ? ?  ?  ? ?  ? ? ? ? ? ? ? ? ? ? ? ? ? Plan - 01/13/22 0947   ? ? Clinical Impression Statement Patient s/p with left fifth ORIF of proximal phalanges on 11/14/21-  8 1/2  weeks post . Patient's active range of motion coming in at fifth MC flexion was 90, walked in 70 degrees flexion of PIP and 40 DIP, during session after passive range of motion had PIP 70- 75 degrees AROM , passive range of motion 80 and DIP 55 with  pain less today - 3-4/10 after paraffin.  With extension of PIP in between mostly in lumbrical fist position.  Reviewed with patient home exercises to do 2 a day using her paraffin at home. Focusing on passive range of motion DIP passive range of motion and with composite PIP/DIP flexion.  Then cont with visual using a highlighter for composite flexion that would provide her about a 75-80 degrees of PIP  flexion.  Add med foam block for pt to grip into after ROM last time.   Reinforced importance of extension between flexion.  Patient continues to have scar adhesion on lateral PIP and proximal phalanges on fifth-patient to focus on scar massage and ROM.  Patient can continue to  benefit from OT services to decrease scar tissue, decrease edema and increase range of motion and strength.   ? OT Occupational Profile and History Problem Focused Assessment - Including review of records relating to presenting problem   ? Occupational performance deficits (Please refer to evaluation for details): ADL's;IADL's;Leisure   ? Body Structure / Function / Physical Skills ADL;Scar mobility;UE functional use;IADL;Pain;Dexterity;FMC;ROM;Edema   ? Psychosocial Skills Habits;Environmental  Adaptations;Routines and Behaviors   ? Rehab Potential Good   ? Clinical Decision Making Limited treatment options, no task modification necessary   ? Comorbidities Affecting Occupational Performance: None   ? Modification or Assistance to Complete Evaluation  No modification of tasks or assist necessary to complete eval   ? OT Frequency 2x / week   ? OT Duration 8 weeks   ? OT Treatment/Interventions Self-care/ADL training;Cryotherapy;Paraffin;Therapeutic exercise;Fluidtherapy;Neuromuscular education;Manual Therapy;Scar mobilization;Splinting;Moist Heat;Contrast Bath;Energy conservation;Passive range of motion;Therapeutic activities;Patient/family education   ? Consulted and Agree with Plan of Care Patient   ? ?  ?  ? ?  ? ? ?Patient will benefit from skilled therapeutic intervention in order to improve the following deficits and impairments:   ?Body Structure / Function / Physical Skills: ADL, Scar mobility, UE functional use, IADL, Pain, Dexterity, FMC, ROM, Edema ?  ?Psychosocial Skills: Habits, Environmental  Adaptations, Routines and Behaviors ? ? ?Visit Diagnosis: ?Muscle weakness (generalized) ? ?Stiffness of left hand, not elsewhere  classified ? ?Pain in left hand ? ?Scar tissue ? ? ? ?Problem List ?Patient Active Problem List  ? Diagnosis Date Noted  ? Vaginal atrophy 02/07/2016  ? Vulvitis 02/07/2016  ? Menopause 02/07/2016  ? Osteopenia 06

## 2022-01-15 ENCOUNTER — Ambulatory Visit: Payer: PPO | Admitting: Occupational Therapy

## 2022-01-15 DIAGNOSIS — M25642 Stiffness of left hand, not elsewhere classified: Secondary | ICD-10-CM

## 2022-01-15 DIAGNOSIS — M6281 Muscle weakness (generalized): Secondary | ICD-10-CM

## 2022-01-15 DIAGNOSIS — L905 Scar conditions and fibrosis of skin: Secondary | ICD-10-CM

## 2022-01-15 DIAGNOSIS — M79642 Pain in left hand: Secondary | ICD-10-CM

## 2022-01-15 NOTE — Therapy (Signed)
[place] [facility] [facility] ?[address] ?[place], [place], [address] ?Phone: [phone]   Fax:  [phone] ? ?Occupational Therapy Treatment ? ?Patient Details  ?Name: Christina Foley ?MRN: [id number] ?Date of Birth: [birth year] ?No data recorded ? ?Encounter Date: 01/15/2022 ? ? OT End of Session - 01/15/22 1404   ? ? Visit Number 9   ? Number of Visits 14   ? Date for OT Re-Evaluation 01/28/22   ? OT Start Time 6073   ? OT Stop Time 1440   ? OT Time Calculation (min) 36 min   ? Activity Tolerance Patient tolerated treatment well   ? Behavior During Therapy [facility] for tasks assessed/performed   ? ?  ?  ? ?  ? ? ?Past Medical History:  ?Diagnosis Date  ? Back pain   ? Cataract cortical, senile, left   ? DDD (degenerative disc disease), cervical   ? Decreased libido   ? Diverticulitis   ? Diverticulosis   ? Environmental allergies   ? seasonal  ? GERD (gastroesophageal reflux disease)   ? Heart murmur   ? 1970s  ? HPV test positive   ? Hyperlipidemia   ? Hypertension   ? Menopausal and postmenopausal disorder   ? Menopause   ? Osteoarthritis   ? Osteopenia   ? Osteoporosis   ? Postmenopausal atrophic vaginitis   ? Rectocele   ? Shoulder pain, right   ? rotator cuff tear  ? Thyroid nodule   ? ? ?Past Surgical History:  ?Procedure Laterality Date  ? BREAST CYST ASPIRATION Bilateral 2000  ? benign  ? BUNIONECTOMY Left   ? CATARACT EXTRACTION Left   ? COLONOSCOPY    ? 1993, 2005, 2009  ? COLONOSCOPY WITH PROPOFOL N/A 03/31/2018  ? Procedure: COLONOSCOPY WITH PROPOFOL;  Surgeon: Manya Silvas, MD;  Location: [facility] ENDOSCOPY;  Service: Endoscopy;  Laterality: N/A;  ? CRYOTHERAPY    ? ESOPHAGOGASTRODUODENOSCOPY  1993  ? ESOPHAGOGASTRODUODENOSCOPY  05/24/2020  ? ESOPHAGOGASTRODUODENOSCOPY (EGD) WITH PROPOFOL N/A 03/31/2018  ? Procedure: ESOPHAGOGASTRODUODENOSCOPY (EGD) WITH PROPOFOL;  Surgeon: Manya Silvas, MD;  Location: [facility] ENDOSCOPY;  Service: Endoscopy;   Laterality: N/A;  ? OPEN REDUCTION INTERNAL FIXATION (ORIF) PROXIMAL PHALANX Left 11/14/2021  ? Procedure: Left, little finger proximal phalanx ORIF;  Surgeon: Hessie Knows, MD;  Location: [facility] ORS;  Service: Orthopedics;  Laterality: Left;  left little finger  ? SHOULDER ARTHROSCOPY Right 10/28/2017  ? Procedure: ARTHROSCOPY SHOULDER EITH DEBRIDEMENT DECOMPRESSIOIN AND REAPIR OF ROTATOR CUFF REPAIR;  Surgeon: Corky Mull, MD;  Location: [place];  Service: Orthopedics;  Laterality: Right;  ? TOTAL HIP ARTHROPLASTY Right 08/07/2014  ? TUBAL LIGATION    ? ? ?There were no vitals filed for this visit. ? ? Subjective Assessment - 01/15/22 1404   ? ? Subjective  Doing okay I think spending a little bit better and I am trying to get it straightened in between I felt like this pasts 2 days it did not crossover as much the pinky.  We on the way to the beach   ? Pertinent History Christina Foley is a 76 y.o. female here today status post left little finger ORIF by Dr. Hessie Knows on 11/14/2021. Patient is doing well. Pain well controlled. Still having some limitation with flexion and slight limitation with extension   ? Patient Stated Goals Want my motion and strength back in L hand to return to what I was doing   ?  Currently in Pain? No/denies   ? ?  ?  ? ?  ? ? ? ? ? [place] OT Assessment - 01/15/22 0001   ? ?  ? Left Hand AROM  ? L Little  MCP 0-90 90 Degrees   ? L Little PIP 0-100 70 Degrees   AROM in session 75, PROM 80  ? L Little DIP 0-70 45 Degrees   50 PROM  ? ?  ?  ? ?  ? ? ? ? ? ? ? ? ? ? ? OT Treatments/Exercises (OP) - 01/15/22 0001   ? ?  ? LUE Paraffin  ? Number Minutes Paraffin 8 Minutes   ? LUE Paraffin Location Hand   ? Comments light flexion wrap to 5th digit for flexion   ? ?  ?  ? ?  ? ?Paraffin done 8 minutes with patient fifth digit  Coban flexion wrap and DIP/PIP flexion stretch with a heating pad around.  Done gentle passive range of motion to DIP, PIP  and intrinsic fist - with  prolonged flexion stretch  ?As well as composite with MC block at 90 degrees several times.  ? ?Done mini massager and graston tool nr 2 for brushing over volar 5th digit - prior to ROM - for flexion , extention   ?  ?[place] Kitchen  With high lighter blocking MC at 90 degrees was able to facilitate PIP active flexion 70-75 degrees and passive range of motion 80 degrees.  ?In between focus on PIP extension with MC at 90 degree flexion in the lumbrical fist position.   ?Reinforced with patient to focus on blocking MC and using highlighter to facilitate 75 to 80 degrees of flexion at PIP on fifth at home.   ?Add med pink foam block  last time for pt in grip/squeeze into  4 cm wide part and then down touching it on narrow side 2 cm  ?  ?Pain under 3/10 during passive range of motion stretches at home - in session was 3/10  today. ?Reviewed with patient and educated on scar massage  ?She has silicone digital sleeves to wear at nighttime ?Patient can continue with buddy strap during the day to align 5th digit- prevent crossing over 4th ? ? ? ? ? ? ? OT Education - 01/15/22 1404   ? ? Education Details HEP, scar massage   ? Person(s) Educated Patient   ? Methods Explanation;Demonstration;Verbal cues;Handout   ? Comprehension Verbalized understanding;Returned demonstration;Verbal cues required   ? ?  ?  ? ?  ? ? ? ? ? ? ? ? ? ? ? ? ? Plan - 01/15/22 1404   ? ? Clinical Impression Statement Patient s/p with left fifth ORIF of proximal phalanges on 11/14/21-  8 1/2  weeks post . Patient's active range of motion coming in at fifth MC flexion was 90, walked in 70 degrees flexion of PIP and 40 DIP, during session after passive range of motion had PIP 70- 75 degrees AROM , passive range of motion 80 and DIP 55 with  pain less today - 3/10 after paraffin.  With extension of PIP in between mostly in lumbrical fist position.  Reviewed with patient home exercises to do 2 a day using her paraffin at home. Focusing on passive range of motion DIP  passive range of motion and with composite PIP/DIP flexion.  Then cont with visual using a highlighter for composite flexion that would provide her about a 75-80 degrees of PIP flexion.  Add  med foam block for pt to grip into after ROM last time.   Reinforced importance of extension between flexion.  Patient continues to have scar adhesion on lateral PIP and proximal phalanges on fifth-patient to focus on scar massage and ROM.  Patient can continue to benefit from OT services to decrease scar tissue, decrease edema and increase range of motion and strength.   ? OT Occupational Profile and History Problem Focused Assessment - Including review of records relating to presenting problem   ? Occupational performance deficits (Please refer to evaluation for details): ADL's;IADL's;Leisure   ? Body Structure / Function / Physical Skills ADL;Scar mobility;UE functional use;IADL;Pain;Dexterity;FMC;ROM;Edema   ? Psychosocial Skills Habits;Environmental  Adaptations;Routines and Behaviors   ? Rehab Potential Good   ? Clinical Decision Making Limited treatment options, no task modification necessary   ? Comorbidities Affecting Occupational Performance: None   ? Modification or Assistance to Complete Evaluation  No modification of tasks or assist necessary to complete eval   ? OT Frequency 2x / week   ? OT Duration 8 weeks   ? OT Treatment/Interventions Self-care/ADL training;Cryotherapy;Paraffin;Therapeutic exercise;Fluidtherapy;Neuromuscular education;Manual Therapy;Scar mobilization;Splinting;Moist Heat;Contrast Bath;Energy conservation;Passive range of motion;Therapeutic activities;Patient/family education   ? Consulted and Agree with Plan of Care Patient   ? ?  ?  ? ?  ? ? ?Patient will benefit from skilled therapeutic intervention in order to improve the following deficits and impairments:   ?Body Structure / Function / Physical Skills: ADL, Scar mobility, UE functional use, IADL, Pain, Dexterity, FMC, ROM, Edema ?   ?Psychosocial Skills: Habits, Environmental  Adaptations, Routines and Behaviors ? ? ?Visit Diagnosis: ?Muscle weakness (generalized) ? ?Stiffness of left hand, not elsewhere classified ? ?Pain in left hand ? ?Scar tissu

## 2022-01-22 ENCOUNTER — Ambulatory Visit: Payer: PPO | Admitting: Occupational Therapy

## 2022-01-22 DIAGNOSIS — M6281 Muscle weakness (generalized): Secondary | ICD-10-CM

## 2022-01-22 DIAGNOSIS — M79642 Pain in left hand: Secondary | ICD-10-CM

## 2022-01-22 DIAGNOSIS — L905 Scar conditions and fibrosis of skin: Secondary | ICD-10-CM

## 2022-01-22 DIAGNOSIS — M25642 Stiffness of left hand, not elsewhere classified: Secondary | ICD-10-CM

## 2022-01-22 NOTE — Therapy (Signed)
[facility] ?[address] ?[place], [place], [address] ?Phone: [phone]   Fax:  [phone] ? ?Occupational Therapy Treatment/PN 10th visit ? ?Patient Details  ?Name: Christina Foley ?MRN: [id number] ?Date of Birth: 08-Sep-1946 ?No data recorded ? ?Encounter Date: 01/22/2022 ? ? OT End of Session - 01/22/22 1651   ? ? Visit Number 10   ? Number of Visits 14   ? Date for OT Re-Evaluation 01/28/22   ? OT Start Time 6606   ? OT Stop Time 1650   ? OT Time Calculation (min) 33 min   ? Activity Tolerance Patient tolerated treatment well   ? Behavior During Therapy [facility] for tasks assessed/performed   ? ?  ?  ? ?  ? ? ?Past Medical History:  ?Diagnosis Date  ? Back pain   ? Cataract cortical, senile, left   ? DDD (degenerative disc disease), cervical   ? Decreased libido   ? Diverticulitis   ? Diverticulosis   ? Environmental allergies   ? seasonal  ? GERD (gastroesophageal reflux disease)   ? Heart murmur   ? 1970s  ? HPV test positive   ? Hyperlipidemia   ? Hypertension   ? Menopausal and postmenopausal disorder   ? Menopause   ? Osteoarthritis   ? Osteopenia   ? Osteoporosis   ? Postmenopausal atrophic vaginitis   ? Rectocele   ? Shoulder pain, right   ? rotator cuff tear  ? Thyroid nodule   ? ? ?Past Surgical History:  ?Procedure Laterality Date  ? BREAST CYST ASPIRATION Bilateral 2000  ? benign  ? BUNIONECTOMY Left   ? CATARACT EXTRACTION Left   ? COLONOSCOPY    ? 1993, 2005, 2009  ? COLONOSCOPY WITH PROPOFOL N/A 03/31/2018  ? Procedure: COLONOSCOPY WITH PROPOFOL;  Surgeon: Manya Silvas, MD;  Location: [facility] ENDOSCOPY;  Service: Endoscopy;  Laterality: N/A;  ? CRYOTHERAPY    ? ESOPHAGOGASTRODUODENOSCOPY  1993  ? ESOPHAGOGASTRODUODENOSCOPY  05/24/2020  ? ESOPHAGOGASTRODUODENOSCOPY (EGD) WITH PROPOFOL N/A 03/31/2018  ? Procedure: ESOPHAGOGASTRODUODENOSCOPY (EGD) WITH PROPOFOL;  Surgeon: Manya Silvas, MD;  Location: [facility] ENDOSCOPY;  Service:  Endoscopy;  Laterality: N/A;  ? OPEN REDUCTION INTERNAL FIXATION (ORIF) PROXIMAL PHALANX Left 11/14/2021  ? Procedure: Left, little finger proximal phalanx ORIF;  Surgeon: Hessie Knows, MD;  Location: [facility] ORS;  Service: Orthopedics;  Laterality: Left;  left little finger  ? SHOULDER ARTHROSCOPY Right 10/28/2017  ? Procedure: ARTHROSCOPY SHOULDER EITH DEBRIDEMENT DECOMPRESSIOIN AND REAPIR OF ROTATOR CUFF REPAIR;  Surgeon: Corky Mull, MD;  Location: [facility];  Service: Orthopedics;  Laterality: Right;  ? TOTAL HIP ARTHROPLASTY Right 08/07/2014  ? TUBAL LIGATION    ? ? ?There were no vitals filed for this visit. ? ? Subjective Assessment - 01/22/22 1650   ? ? Subjective  The beach was nice I did my exercises when other kids went to the beach.  I can feel my heart worse getting more prominent and feel like I am stuck with the bending.   ? Pertinent History Christina Foley is a 76 y.o. female here today status post left little finger ORIF by Dr. Hessie Knows on 11/14/2021. Patient is doing well. Pain well controlled. Still having some limitation with flexion and slight limitation with extension   ? Patient Stated Goals Want my motion and strength back in L hand to return to what I was doing   ? Currently in Pain? No/denies   ? ?  ?  ? ?  ? ? ? ? ?  [facility] OT Assessment - 01/22/22 0001   ? ?  ? Edema  ? Edema L 5.7 prox phalanges, R 4.9 cm   ?  ? Strength  ? Right Hand Grip (lbs) 54   ? Right Hand Lateral Pinch 10 lbs   ? Right Hand 3 Point Pinch 10 lbs   ? Left Hand Grip (lbs) 36   ? Left Hand Lateral Pinch 11 lbs   ? Left Hand 3 Point Pinch 11 lbs   ?  ? Left Hand AROM  ? L Little  MCP 0-90 90 Degrees   ? L Little PIP 0-100 70 Degrees   -30 PROM 80  ? L Little DIP 0-70 45 Degrees   ? ?  ?  ? ?  ? ? ? ?  Patient arrived this date with increase PIP extension lag after being at the beach doing home programs.  Done LMB splint for PIP extension with paraffin this date prior to soft tissue. ? ? ? ? ? ? ? OT  Treatments/Exercises (OP) - 01/22/22 0001   ? ?  ? LUE Paraffin  ? Number Minutes Paraffin 8 Minutes   ? LUE Paraffin Location Hand   ? Comments LMB extension PIP splint on prior to soft tissue   ? ?  ?  ? ?  ? ?Done mini massager and graston tool nr 2 for brushing over volar 5th digit - prior to ROM - for flexion , extention   ?  Done gentle passive range of motion to DIP flexion and PIP extention with lumbrical fist degrees several times.  ?  ? ?  ?Prior to paraffin - pt walk in 63 MC , PIP 80 PROM and AROM 70 and DIP 45  ?With high lighter blocking MC at 90 degrees was able to facilitate PIP active flexion 70-75 degrees and passive range of motion 80 degrees.  ?In between focus on PIP extension with MC at 90 degree flexion in the lumbrical fist position.   ?  ?Pain under 3/10 during passive range of motion stretches at home - in session  ?Reviewed with patient and educated on scar massage  ?She has silicone digital sleeves to wear at nighttime ?Patient can continue with buddy strap during the day to align 5th digit- prevent crossing over 4th ?Pt to see surgeon Friday -possibly removal of hardware ?  ? ? ? ? ? OT Education - 01/22/22 1651   ? ? Education Details HEP review and progress   ? Person(s) Educated Patient   ? Methods Explanation;Demonstration;Verbal cues;Handout   ? Comprehension Verbalized understanding;Returned demonstration;Verbal cues required   ? ?  ?  ? ?  ? ? ? ? ? ? ? ? ? ? ? ? ? Plan - 01/22/22 1652   ? ? Clinical Impression Statement Patient s/p with left fifth ORIF of proximal phalanges on 11/14/21-  10  weeks post . Patient's active range of motion coming in at fifth MC flexion was 90, walked in 70 degrees flexion of PIP and 45 DIP.  Extention PIP -30. Hardware more prominent at dorsal PIP - PROM  flexion cont to be about 80 at PIP - pain increase with ROM to 80 - Done paraffin with flexion coban wrap in the past and LMB splint on for PIP extention this date.Pt has paraffin bath at home that  she uses. Reinforced importance of extension between flexion.  Pt improved from [facility] from PIP flexion 40 to 70 degrees ( PROM 80) , DIP  25 SOC ( NOW 45-50 degrees). [place] 90 -  Patient continues to have scar adhesion on lateral PIP and proximal phalanges on fifth. Pt to see surgeon on Friday.  Patient can continue to benefit from OT services to decrease scar tissue, decrease edema and increase range of motion and strength.   ? OT Occupational Profile and History Problem Focused Assessment - Including review of records relating to presenting problem   ? Occupational performance deficits (Please refer to evaluation for details): ADL's;IADL's;Leisure   ? Body Structure / Function / Physical Skills ADL;Scar mobility;UE functional use;IADL;Pain;Dexterity;FMC;ROM;Edema   ? Psychosocial Skills Habits;Environmental  Adaptations;Routines and Behaviors   ? Rehab Potential Good   ? Clinical Decision Making Limited treatment options, no task modification necessary   ? Comorbidities Affecting Occupational Performance: None   ? Modification or Assistance to Complete Evaluation  No modification of tasks or assist necessary to complete eval   ? OT Frequency 2x / week   ? OT Duration 8 weeks   ? OT Treatment/Interventions Self-care/ADL training;Cryotherapy;Paraffin;Therapeutic exercise;Fluidtherapy;Neuromuscular education;Manual Therapy;Scar mobilization;Splinting;Moist Heat;Contrast Bath;Energy conservation;Passive range of motion;Therapeutic activities;Patient/family education   ? Consulted and Agree with Plan of Care Patient   ? ?  ?  ? ?  ? ? ?Patient will benefit from skilled therapeutic intervention in order to improve the following deficits and impairments:   ?Body Structure / Function / Physical Skills: ADL, Scar mobility, UE functional use, IADL, Pain, Dexterity, FMC, ROM, Edema ?  ?Psychosocial Skills: Habits, Environmental  Adaptations, Routines and Behaviors ? ? ?Visit Diagnosis: ?Muscle weakness (generalized) ? ?Stiffness  of left hand, not elsewhere classified ? ?Pain in left hand ? ?Scar tissue ? ? ? ?Problem List ?Patient Active Problem List  ? Diagnosis Date Noted  ? Vaginal atrophy 02/07/2016  ? Vulvitis 02/07/2016  ? Me

## 2022-01-24 DIAGNOSIS — S62647D Nondisplaced fracture of proximal phalanx of left little finger, subsequent encounter for fracture with routine healing: Secondary | ICD-10-CM | POA: Diagnosis not present

## 2022-01-28 ENCOUNTER — Other Ambulatory Visit: Payer: Self-pay | Admitting: Orthopedic Surgery

## 2022-01-28 DIAGNOSIS — M5416 Radiculopathy, lumbar region: Secondary | ICD-10-CM | POA: Diagnosis not present

## 2022-01-28 DIAGNOSIS — M5136 Other intervertebral disc degeneration, lumbar region: Secondary | ICD-10-CM | POA: Diagnosis not present

## 2022-01-31 ENCOUNTER — Other Ambulatory Visit: Payer: Self-pay

## 2022-01-31 ENCOUNTER — Encounter
Admission: RE | Admit: 2022-01-31 | Discharge: 2022-01-31 | Disposition: A | Discharge status: Home / Self Care | Payer: PPO | Source: Ambulatory Visit | Attending: Orthopedic Surgery | Admitting: Orthopedic Surgery

## 2022-01-31 DIAGNOSIS — I10 Essential (primary) hypertension: Secondary | ICD-10-CM

## 2022-01-31 DIAGNOSIS — Z01812 Encounter for preprocedural laboratory examination: Secondary | ICD-10-CM

## 2022-01-31 HISTORY — DX: Pneumonia, unspecified organism: J18.9

## 2022-01-31 NOTE — Patient Instructions (Addendum)
Your procedure is scheduled on: 02/04/22 - Tuesday Report to the Registration Desk on the 1st floor of the [facility]. To find out your arrival time, please call [phone] between 1PM - 3PM on: 02/03/22 - Monday If your arrival time is 6:00 am, do not arrive prior to that time as the [facility] entrance doors do not open until 6:00 am.  REMEMBER: Instructions that are not followed completely may result in serious medical risk, up to and including death; or upon the discretion of your surgeon and anesthesiologist your surgery may need to be rescheduled.  Do not eat food after midnight the night before surgery.  No gum chewing, lozengers or hard candies.  You may however, drink CLEAR liquids up to 2 hours before you are scheduled to arrive for your surgery. Do not drink anything within 2 hours of your scheduled arrival time.  Clear liquids include: - water  - apple juice without pulp - gatorade (not RED colors) - black coffee or tea (Do NOT add milk or creamers to the coffee or tea) Do NOT drink anything that is not on this list.  TAKE THESE MEDICATIONS THE MORNING OF SURGERY WITH A SIP OF WATER:  - pantoprazole (PROTONIX) 40 MG tablet, (take one the night before and one on the morning of surgery - helps to prevent nausea after surgery.)  One week prior to surgery: Stop Anti-inflammatories (NSAIDS) such as Advil, Aleve, Ibuprofen, Motrin, Naproxen, Naprosyn and Aspirin based products such as Excedrin, Goodys Powder, BC Powder.  Stop ANY OVER THE COUNTER supplements until after surgery.  You may however, continue to take Tylenol if needed for pain up until the day of surgery.  No Alcohol for 24 hours before or after surgery.  No Smoking including e-cigarettes for 24 hours prior to surgery.  No chewable tobacco products for at least 6 hours prior to surgery.  No nicotine patches on the day of surgery.  Do not use any "recreational" drugs for at least a week prior to your  surgery.  Please be advised that the combination of cocaine and anesthesia may have negative outcomes, up to and including death. If you test positive for cocaine, your surgery will be cancelled.  On the morning of surgery brush your teeth with toothpaste and water, you may rinse your mouth with mouthwash if you wish. Do not swallow any toothpaste or mouthwash.  Do not wear jewelry, make-up, hairpins, clips or nail polish.  Do not wear lotions, powders, or perfumes.   Do not shave body from the neck down 48 hours prior to surgery just in case you cut yourself which could leave a site for infection.  Also, freshly shaved skin may become irritated if using the CHG soap.  Contact lenses, hearing aids and dentures may not be worn into surgery.  Do not bring valuables to the hospital. [facility] is not responsible for any missing/lost belongings or valuables.   Notify your doctor if there is any change in your medical condition (cold, fever, infection).  Wear comfortable clothing (specific to your surgery type) to the hospital.  After surgery, you can help prevent lung complications by doing breathing exercises.  Take deep breaths and cough every 1-2 hours. Your doctor may order a device called an Incentive Spirometer to help you take deep breaths. When coughing or sneezing, hold a pillow firmly against your incision with both hands. This is called "splinting." Doing this helps protect your incision. It also decreases belly discomfort.  If  you are being admitted to the hospital overnight, leave your suitcase in the car. After surgery it may be brought to your room.  If you are being discharged the day of surgery, you will not be allowed to drive home. You will need a responsible adult (18 years or older) to drive you home and stay with you that night.   If you are taking public transportation, you will need to have a responsible adult (18 years or older) with you. Please confirm with  your physician that it is acceptable to use public transportation.   Please call the [facility] at [phone] if you have any questions about these instructions.  Surgery Visitation Policy:  Patients undergoing a surgery or procedure may have two family members or support persons with them as long as the person is not COVID-19 positive or experiencing its symptoms.   Inpatient Visitation:    Visiting hours are 7 a.m. to 8 p.m. Up to four visitors are allowed at one time in a patient room, including children. The visitors may rotate out with other people during the day. One designated support person (adult) may remain overnight.

## 2022-02-04 ENCOUNTER — Ambulatory Visit
Admission: RE | Admit: 2022-02-04 | Discharge: 2022-02-04 | Disposition: A | Discharge status: Home / Self Care | Payer: PPO | Attending: Orthopedic Surgery | Admitting: Orthopedic Surgery

## 2022-02-04 ENCOUNTER — Ambulatory Visit: Payer: PPO | Admitting: Anesthesiology

## 2022-02-04 ENCOUNTER — Other Ambulatory Visit: Payer: Self-pay

## 2022-02-04 ENCOUNTER — Encounter: Payer: Self-pay | Admitting: Orthopedic Surgery

## 2022-02-04 ENCOUNTER — Encounter
Admission: RE | Disposition: A | Discharge status: Home / Self Care | Payer: Self-pay | Source: Home / Self Care | Attending: Orthopedic Surgery

## 2022-02-04 ENCOUNTER — Ambulatory Visit: Payer: PPO

## 2022-02-04 ENCOUNTER — Ambulatory Visit: Payer: PPO | Admitting: Urgent Care

## 2022-02-04 DIAGNOSIS — T8484XA Pain due to internal orthopedic prosthetic devices, implants and grafts, initial encounter: Secondary | ICD-10-CM | POA: Diagnosis not present

## 2022-02-04 DIAGNOSIS — Z8781 Personal history of (healed) traumatic fracture: Secondary | ICD-10-CM | POA: Insufficient documentation

## 2022-02-04 DIAGNOSIS — I10 Essential (primary) hypertension: Secondary | ICD-10-CM

## 2022-02-04 DIAGNOSIS — X58XXXA Exposure to other specified factors, initial encounter: Secondary | ICD-10-CM | POA: Insufficient documentation

## 2022-02-04 DIAGNOSIS — Z01812 Encounter for preprocedural laboratory examination: Secondary | ICD-10-CM

## 2022-02-04 DIAGNOSIS — S62647D Nondisplaced fracture of proximal phalanx of left little finger, subsequent encounter for fracture with routine healing: Secondary | ICD-10-CM | POA: Diagnosis not present

## 2022-02-04 DIAGNOSIS — Z9889 Other specified postprocedural states: Secondary | ICD-10-CM | POA: Diagnosis not present

## 2022-02-04 HISTORY — PX: HARDWARE REMOVAL: SHX979

## 2022-02-04 LAB — CBC
HCT: 37.9 % (ref 36.0–46.0)
Hemoglobin: 12.6 g/dL (ref 12.0–15.0)
MCH: 31.7 pg (ref 26.0–34.0)
MCHC: 33.2 g/dL (ref 30.0–36.0)
MCV: 95.2 fL (ref 80.0–100.0)
Platelets: 415 10*3/uL — ABNORMAL HIGH (ref 150–400)
RBC: 3.98 MIL/uL (ref 3.87–5.11)
RDW: 12.7 % (ref 11.5–15.5)
WBC: 9 10*3/uL (ref 4.0–10.5)
nRBC: 0 % (ref 0.0–0.2)

## 2022-02-04 LAB — BASIC METABOLIC PANEL
Anion gap: 11 (ref 5–15)
BUN: 11 mg/dL (ref 8–23)
CO2: 27 mmol/L (ref 22–32)
Calcium: 9.7 mg/dL (ref 8.9–10.3)
Chloride: 101 mmol/L (ref 98–111)
Creatinine, Ser: 0.54 mg/dL (ref 0.44–1.00)
GFR, Estimated: >60 mL/min (ref >60)
Glucose, Bld: 113 mg/dL — ABNORMAL HIGH (ref 70–99)
Potassium: 3.8 mmol/L (ref 3.5–5.1)
Sodium: 139 mmol/L (ref 135–145)

## 2022-02-04 SURGERY — REMOVAL, HARDWARE
Anesthesia: General | Site: Finger | Laterality: Left

## 2022-02-04 MED ORDER — FENTANYL CITRATE (PF) 100 MCG/2ML IJ SOLN: 25.0000 ug | INTRAMUSCULAR | Status: DC | PRN

## 2022-02-04 MED ORDER — CEFAZOLIN SODIUM-DEXTROSE 1-4 GM/50ML-% IV SOLN
INTRAVENOUS | Status: AC
  Filled 2022-02-04: qty 50

## 2022-02-04 MED ORDER — BUPIVACAINE HCL (PF) 0.5 % IJ SOLN
INTRAMUSCULAR | Status: DC | PRN
  Administered 2022-02-04: 10 mL

## 2022-02-04 MED ORDER — FENTANYL CITRATE (PF) 100 MCG/2ML IJ SOLN
INTRAMUSCULAR | Status: DC | PRN
  Administered 2022-02-04: 50 ug via INTRAVENOUS
  Administered 2022-02-04: 25 ug via INTRAVENOUS

## 2022-02-04 MED ORDER — FENTANYL CITRATE (PF) 100 MCG/2ML IJ SOLN
INTRAMUSCULAR | Status: AC
  Filled 2022-02-04: qty 2

## 2022-02-04 MED ORDER — PROPOFOL 10 MG/ML IV BOLUS
INTRAVENOUS | Status: DC | PRN
  Administered 2022-02-04: 150 mg via INTRAVENOUS

## 2022-02-04 MED ORDER — BUPIVACAINE HCL (PF) 0.5 % IJ SOLN
INTRAMUSCULAR | Status: AC
  Filled 2022-02-04: qty 30

## 2022-02-04 MED ORDER — DEXAMETHASONE SODIUM PHOSPHATE 10 MG/ML IJ SOLN
INTRAMUSCULAR | Status: AC
  Filled 2022-02-04: qty 1

## 2022-02-04 MED ORDER — LIDOCAINE HCL URETHRAL/MUCOSAL 2 % EX GEL
CUTANEOUS | Status: AC
  Filled 2022-02-04: qty 6

## 2022-02-04 MED ORDER — CHLORHEXIDINE GLUCONATE 0.12 % MT SOLN: 15.0000 mL | Freq: Once | OROMUCOSAL | Status: AC

## 2022-02-04 MED ORDER — 0.9 % SODIUM CHLORIDE (POUR BTL) OPTIME
TOPICAL | Status: DC | PRN
  Administered 2022-02-04: 500 mL

## 2022-02-04 MED ORDER — OXYCODONE HCL 5 MG/5ML PO SOLN: 5.0000 mg | Freq: Once | ORAL | Status: DC | PRN

## 2022-02-04 MED ORDER — GLYCOPYRROLATE 0.2 MG/ML IJ SOLN
INTRAMUSCULAR | Status: AC
  Filled 2022-02-04: qty 1

## 2022-02-04 MED ORDER — OXYCODONE HCL 5 MG PO TABS: 5.0000 mg | ORAL_TABLET | Freq: Once | ORAL | Status: DC | PRN

## 2022-02-04 MED ORDER — ACETAMINOPHEN 10 MG/ML IV SOLN
INTRAVENOUS | Status: AC
  Filled 2022-02-04: qty 100

## 2022-02-04 MED ORDER — ACETAMINOPHEN 10 MG/ML IV SOLN: 1000.0000 mg | Freq: Once | INTRAVENOUS | Status: DC | PRN

## 2022-02-04 MED ORDER — ONDANSETRON HCL 4 MG/2ML IJ SOLN
INTRAMUSCULAR | Status: DC | PRN
  Administered 2022-02-04: 4 mg via INTRAVENOUS

## 2022-02-04 MED ORDER — LIDOCAINE HCL (PF) 2 % IJ SOLN
INTRAMUSCULAR | Status: DC | PRN
  Administered 2022-02-04: 50 mg

## 2022-02-04 MED ORDER — ONDANSETRON HCL 4 MG/2ML IJ SOLN
INTRAMUSCULAR | Status: AC
  Filled 2022-02-04: qty 2

## 2022-02-04 MED ORDER — CHLORHEXIDINE GLUCONATE 0.12 % MT SOLN
OROMUCOSAL | Status: AC
  Administered 2022-02-04: 15 mL via OROMUCOSAL
  Filled 2022-02-04: qty 15

## 2022-02-04 MED ORDER — CEFAZOLIN SODIUM-DEXTROSE 1-4 GM/50ML-% IV SOLN
1.0000 g | INTRAVENOUS | Status: AC
  Administered 2022-02-04: 1 g via INTRAVENOUS

## 2022-02-04 MED ORDER — MIDAZOLAM HCL 5 MG/5ML IJ SOLN
INTRAMUSCULAR | Status: DC | PRN
  Administered 2022-02-04: 2 mg via INTRAVENOUS

## 2022-02-04 MED ORDER — LIDOCAINE HCL (PF) 2 % IJ SOLN
INTRAMUSCULAR | Status: AC
  Filled 2022-02-04: qty 5

## 2022-02-04 MED ORDER — ORAL CARE MOUTH RINSE: 15.0000 mL | Freq: Once | OROMUCOSAL | Status: AC

## 2022-02-04 MED ORDER — PHENYLEPHRINE HCL (PRESSORS) 10 MG/ML IV SOLN
INTRAVENOUS | Status: DC | PRN
  Administered 2022-02-04 (×3): 80 ug via INTRAVENOUS

## 2022-02-04 MED ORDER — ONDANSETRON HCL 4 MG/2ML IJ SOLN
4.0000 mg | Freq: Once | INTRAMUSCULAR | Status: DC | PRN
Start: 2022-02-04 — End: 2022-02-04

## 2022-02-04 MED ORDER — GLYCOPYRROLATE 0.2 MG/ML IJ SOLN
INTRAMUSCULAR | Status: DC | PRN
  Administered 2022-02-04: .2 mg via INTRAVENOUS

## 2022-02-04 MED ORDER — MIDAZOLAM HCL 2 MG/2ML IJ SOLN
INTRAMUSCULAR | Status: AC
  Filled 2022-02-04: qty 2

## 2022-02-04 MED ORDER — LACTATED RINGERS IV SOLN: INTRAVENOUS | Status: DC

## 2022-02-04 MED ORDER — DEXAMETHASONE SODIUM PHOSPHATE 10 MG/ML IJ SOLN
INTRAMUSCULAR | Status: DC | PRN
  Administered 2022-02-04: 10 mg via INTRAVENOUS

## 2022-02-04 SURGICAL SUPPLY — 51 items
BNDG GAUZE 1X2.1 STRL (MISCELLANEOUS) ×1 IMPLANT
CAST PADDING 2X4YD ST 30245 (MISCELLANEOUS)
CHLORAPREP W/TINT 26 (MISCELLANEOUS) ×3 IMPLANT
CUFF TOURN SGL QUICK 18X4 (TOURNIQUET CUFF) IMPLANT
CUFF TOURN SGL QUICK 24 (TOURNIQUET CUFF)
CUFF TOURN SGL QUICK 34 (TOURNIQUET CUFF)
CUFF TRNQT CYL 24X4X16.5-23 (TOURNIQUET CUFF) IMPLANT
CUFF TRNQT CYL 34X4.125X (TOURNIQUET CUFF) IMPLANT
DRAPE C-ARM XRAY 36X54 (DRAPES) ×1 IMPLANT
DRAPE C-ARMOR (DRAPES) ×1 IMPLANT
DRAPE FLUOR MINI C-ARM 54X84 (DRAPES) ×2 IMPLANT
DRAPE INCISE IOBAN 66X45 STRL (DRAPES) ×1 IMPLANT
DRSG EMULSION OIL 3X8 NADH (GAUZE/BANDAGES/DRESSINGS) ×1 IMPLANT
ELECT CAUTERY BLADE 6.4 (BLADE) ×2 IMPLANT
ELECT REM PT RETURN 9FT ADLT (ELECTROSURGICAL) ×4
ELECTRODE REM PT RTRN 9FT ADLT (ELECTROSURGICAL) ×2 IMPLANT
GAUZE SPONGE 4X4 12PLY STRL (GAUZE/BANDAGES/DRESSINGS) ×3 IMPLANT
GAUZE XEROFORM 1X8 LF (GAUZE/BANDAGES/DRESSINGS) ×3 IMPLANT
GLOVE SURG SYN 9.0  PF PI (GLOVE) ×1
GLOVE SURG SYN 9.0 PF PI (GLOVE) ×2 IMPLANT
GOWN SRG 2XL LVL 4 RGLN SLV (GOWNS) ×2 IMPLANT
GOWN STRL NON-REIN 2XL LVL4 (GOWNS) ×1
GOWN STRL REUS W/ TWL LRG LVL3 (GOWN DISPOSABLE) ×2 IMPLANT
GOWN STRL REUS W/TWL LRG LVL3 (GOWN DISPOSABLE) ×1
HANDLE YANKAUER SUCT BULB TIP (MISCELLANEOUS) ×1 IMPLANT
KIT TURNOVER KIT A (KITS) ×3 IMPLANT
MANIFOLD NEPTUNE II (INSTRUMENTS) ×3 IMPLANT
NDL FILTER BLUNT 18X1 1/2 (NEEDLE) ×1 IMPLANT
NEEDLE FILTER BLUNT 18X 1/2SAF (NEEDLE)
NEEDLE FILTER BLUNT 18X1 1/2 (NEEDLE) IMPLANT
NS IRRIG 1000ML POUR BTL (IV SOLUTION) ×1 IMPLANT
NS IRRIG 500ML POUR BTL (IV SOLUTION) ×2 IMPLANT
PACK EXTREMITY ARMC (MISCELLANEOUS) ×3 IMPLANT
PAD ABD DERMACEA PRESS 5X9 (GAUZE/BANDAGES/DRESSINGS) ×2 IMPLANT
PAD PREP 24X41 OB/GYN DISP (PERSONAL CARE ITEMS) ×2 IMPLANT
PADDING CAST COTTON 2X4 ST (MISCELLANEOUS) ×1 IMPLANT
SCALPEL PROTECTED #15 DISP (BLADE) ×6 IMPLANT
SPLINT CAST 1 STEP 3X12 (MISCELLANEOUS) ×1 IMPLANT
SPONGE GAUZE 2X2 8PLY STRL LF (GAUZE/BANDAGES/DRESSINGS) ×1 IMPLANT
STAPLER SKIN PROX 35W (STAPLE) ×1 IMPLANT
SUT ETHIBOND 4-0 (SUTURE) ×2 IMPLANT
SUT ETHIBOND NAB CT1 #1 30IN (SUTURE) ×1 IMPLANT
SUT ETHILON 3-0 FS-10 30 BLK (SUTURE) ×2
SUT ETHILON 5-0 FS-2 18 BLK (SUTURE) ×1 IMPLANT
SUT VIC AB 0 CT1 36 (SUTURE) ×1 IMPLANT
SUT VIC AB 2-0 CT1 27 (SUTURE)
SUT VIC AB 2-0 CT1 TAPERPNT 27 (SUTURE) ×1 IMPLANT
SUTURE EHLN 3-0 FS-10 30 BLK (SUTURE) ×1 IMPLANT
SYR 10ML LL (SYRINGE) ×1 IMPLANT
WATER STERILE IRR 1000ML POUR (IV SOLUTION) ×1 IMPLANT
WATER STERILE IRR 500ML POUR (IV SOLUTION) ×3 IMPLANT

## 2022-02-04 NOTE — Op Note (Signed)
02/04/2022  2:32 PM  PATIENT:  Christina Foley  76 y.o. female  PRE-OPERATIVE DIAGNOSIS:  S/P ORIF fracture  Z98.890, Z87.81 Closed nondisplaced fracture of proximal phalanx of left little finger with routine healing, subsequent encounter  S62.647D  POST-OPERATIVE DIAGNOSIS:  painful hardware left little finger  PROCEDURE:  Procedure(s): Left little finger hardware removal (Left)  SURGEON: Laurene Footman, MD  ASSISTANTS: None  ANESTHESIA:   general  EBL:  Total I/O In: 550 [I.V.:500; IV Piggyback:50] Out: -   BLOOD ADMINISTERED:none  DRAINS: none   LOCAL MEDICATIONS USED:  MARCAINE     SPECIMEN:  No Specimen  DISPOSITION OF SPECIMEN:  N/A  COUNTS:  YES  TOURNIQUET:   Total Tourniquet Time Documented: Upper Arm (Left) - 22 minutes Total: Upper Arm (Left) - 22 minutes   IMPLANTS: None  DICTATION: .Dragon Dictation patient was placed under general anesthesia and the left arm was prepped and draped in usual sterile fashion.  After patient identification and timeout procedure tourniquet was raised and the prior incision was opened.  The plate was exposed and all screws removed without difficulty.  On elevating soft tissue off the plate the flexor tendon was also checked and had some adhesions when these were released nearly full motion of the finger was obtained and put a finger placed in full flexion.  After removal of the plate the fracture was examined and appeared to be healed clinically with gentle motion showing no motion at the fracture site.  The wound was irrigated and then closed with simple erupted 5-0 nylon with 10 cc of half percent Sensorcaine plain infiltrated the start of the case for postop analgesia.  Xeroform 4 x 4's web roll and a dorsal splint holding the ring and little fingers in flexion was placed followed by an Ace wrap.  Tourniquet down with close the case.  PLAN OF CARE: Discharge to home after PACU  PATIENT DISPOSITION:  PACU -  hemodynamically stable.

## 2022-02-04 NOTE — H&P (Signed)
Chief Complaint  Patient presents with   Left Little Finger - Follow-up    History of the Present Illness: Christina Foley is a 76 y.o. female here today.   The patient presents for follow-up evaluation status post ORIF of the left small finger performed on 11/14/2021. I received a note from therapy saying she is stuck with therapy and has not made progress.  She cannot straighten her left small finger, and she cannot flex it all the way down. She went through physical therapy last year, and it was very helpful.  She has another issue with her left sciatic nerve. She has had 2 epidurals by Dr. Sharlet Salina, and they have had some success. She still has pain from sitting. She states it seems like it is running all the way across the back and then down toward the coccyx. She had an MRI back in 04/2021, and that was the last one. She underwent an x-ray of the right hip by Dr. Marry Guan. If she sits wrong, she gets a "lightning bolt" sensation in her right hip that radiates down the back of her leg. She has to be cautious about getting in and out of cars and getting in and out of bed. She has prednisone at home. She uses a TENS unit, tilt table, and lidocaine patches. She does everything she can to avoid surgery. She is hoping that when she cuts back on surgery soon, she can start losing weight.  She has a trip planned to [place] on 03/09/2022.  I have reviewed past medical, surgical, social and family history, and allergies as documented in the EMR.  Past Medical History: Past Medical History:  Diagnosis Date   Cataract cortical, senile around 2014  left eye   Chickenpox   DDD (degenerative disc disease), cervical   Diverticulosis   Encounter for blood transfusion 1977  following birth of son   GERD (gastroesophageal reflux disease)   Hyperlipidemia, mixed   Hypertension   Osteoporosis   Seasonal allergies   Past Surgical History: Past Surgical History:  Procedure Laterality Date   EGD  12/02/1991   Right total hip arthroplasty 08/07/2014  Dr. Marry Guan   Limited arthroscopic debridement,arthroscopic subacromial tendon repair,arthroscopic subacromial decompression,mini open rotator cuff repair and mini-open biceps tendoesis,right shoulder Right 10/28/2017  DR.Poggi   COLONOSCOPY 03/31/2018  Hyperplastic Polyp: CBF 03/2028   EGD 04/05/2018  Looks close to Barrett's Esophagus per RTE: CBF 03/2021   EGD 05/24/2020  Normal EGD/Repeat as needed/CTL   Left, little finger proximal phalanx ORIF (Left) - left little finger 11/14/2021  Dr Rudene Christians   CATARACT EXTRACTION several years ago  left eye   COLONOSCOPY 10/13/2007, 10/24/2003, 12/02/1991  Int Hemorrhoids, Diverticulosis: CBF 10/2017; Recall Ltr mailed 09/17/2017 (dh)   Foot surgery   TUBAL LIGATION Bilateral   Past Family History: Family History  Problem Relation Age of Onset   Myocardial Infarction (Heart attack) Father 55   High blood pressure (Hypertension) Father   Thyroid cancer Mother 57   High blood pressure (Hypertension) Brother   Osteoarthritis Paternal Grandmother   Breast cancer Maternal Aunt   High blood pressure (Hypertension) Brother   Medications: Current Outpatient Medications Ordered in Epic  Medication Sig Dispense Refill   acetaminophen (TYLENOL) 500 MG tablet Take 500 mg by mouth once daily. prn   ascorbic acid, vitamin C, 500 mg Chew Take 1 tablet by mouth once daily   biotin 2,500 mcg Tab Take by mouth once daily.   CALCIUM CARBONATE (CALCIUM 500 ORAL)  Take 1 tablet by mouth once daily.    cholecalciferol (VITAMIN D3) 2,000 unit tablet Take 2,000 Units by mouth once daily.    diphenoxylate-atropine (LOMOTIL) 2.5-0.025 mg tablet Take 1 tablet by mouth once daily as needed for Diarrhea 40 tablet 1   EPIPEN 2-PAK 0.3 mg/0.3 mL (1:1,000) pen injector Inject 0.3 mg into the muscle once as needed.    estradioL (ESTRACE) 0.01 % (0.1 mg/gram) vaginal cream Place 0.5 g vaginally once a week 42.5 g 11    famotidine (PEPCID) 20 MG tablet Take 1 tablet (20 mg total) by mouth once daily 90 tablet 3   fluticasone propionate (FLONASE) 50 mcg/actuation nasal spray Place 2 sprays into both nostrils once daily as needed for Rhinitis   gabapentin (NEURONTIN) 100 MG capsule Take 1 capsule (100 mg total) by mouth once daily 30 capsule 11   HYDROcodone-acetaminophen (NORCO) 7.5-325 mg tablet Take 1 tablet by mouth every 6 (six) hours as needed   L. ACIDOPHILUS/PECTIN, CITRUS (ACIDOPHILUS PROBIOTIC ORAL) Take by mouth once daily.   lisinopriL-hydrochlorothiazide (ZESTORETIC) 10-12.5 mg tablet Take 1 tablet by mouth once daily 90 tablet 1   loratadine (CLARITIN) 10 mg capsule Take 10 mg by mouth once daily as needed.   methocarbamoL (ROBAXIN) 500 MG tablet 1/2-1 po qHS prn 30 tablet 0   minoxidiL 2.5 MG tablet Take 1.25 mg by mouth once daily   pantoprazole (PROTONIX) 40 MG DR tablet Take 1 tablet (40 mg total) by mouth 2 (two) times daily Take 1 tablet 30 mins before breakfast and 1 tablet 30 mins before dinner. 180 tablet 3   sertraline (ZOLOFT) 50 MG tablet Take 1 tablet (50 mg total) by mouth once daily 90 tablet 3   predniSONE (DELTASONE) 10 MG tablet 6 day taper - Take as directed (Patient not taking: Reported on 01/24/2022) 21 tablet 0   No current Epic-ordered facility-administered medications on file.   Allergies: Allergies  Allergen Reactions   Fosamax [Alendronate] Other (See Comments)  Severe reflux    Body mass index is 25.53 kg/m.  Review of Systems: A comprehensive 14 point ROS was performed, reviewed, and the pertinent orthopaedic findings are documented in the HPI.  There were no vitals filed for this visit.   General Physical Examination:   General/Constitutional: No apparent distress: well-nourished and well developed. Eyes: Pupils equal, round with synchronous movement. Lungs: Clear to auscultation HEENT: Normal Vascular: No edema, swelling or tenderness, except as noted in  detailed exam. Cardiac: Heart rate and rhythm is regular. Integumentary: No impressive skin lesions present, except as noted in detailed exam. Neuro/Psych: Normal mood and affect, oriented to person, place and time.  On exam, left small finger extension.  Radiographs:  AP, lateral, and oblique x-rays of the left small finger were ordered and personally reviewed today. These show progress in healing. There is some extension deformity at the fracture site, and suspect there is also probably a scar tissue.  X-ray Impression Severe arthritis of the IP joint and extension deformity at the fracture site with some evidence of healing.  Assessment: ICD-10-CM  1. S/P ORIF (open reduction internal fixation) fracture Z98.890  Z87.81  2. Closed nondisplaced fracture of proximal phalanx of left little finger with routine healing, subsequent encounter S62.647D   Plan: The patient has clinical findings of stable left small finger status post ORIF.  We discussed the patient's x-ray findings. I explained she has severe arthritis of the IP joint and extension deformity at the fracture site with some  evidence of healing. I recommend hardware removal of the left small finger to make sure it is all healed. If it is not healed, I will put a new plate on. I explained the surgery and postoperative course in detail. I advised her to walk as much as she can and avoid bending, twisting, and lifting. I also advised her to get a TENS unit for her back pain.  We will schedule the patient for surgery in the near future.  Surgical Risks:  The nature of the condition and the proposed procedure has been reviewed in detail with the patient. Surgical versus non-surgical options and prognosis for recovery have been reviewed and the inherent risks and benefits of each have been discussed including the risks of infection, bleeding, injury to nerves/blood vessels/tendons, incomplete relief of symptoms, persisting pain and/or  stiffness, loss of function, complex regional pain syndrome, failure of the procedure, as appropriate.  Document Attestation: I, T. Tamilselvi, have reviewed and updated documentation for [facility], MD, utilizing [employer] D[employer].   Electronically signed by Lauris Poag, MD at 01/26/2022 6:26 PM EDT  Reviewed  H+P. No changes noted.

## 2022-02-04 NOTE — Anesthesia Preprocedure Evaluation (Signed)
Anesthesia Evaluation  Patient identified by MRN, date of birth, ID band Patient awake    Reviewed: Allergy & Precautions, NPO status , Patient's Chart, lab work & pertinent test results  History of Anesthesia Complications Negative for: history of anesthetic complications  Airway Mallampati: II  TM Distance: >3 FB Neck ROM: full    Dental  (+) Chipped   Pulmonary neg pulmonary ROS, neg shortness of breath,    Pulmonary exam normal        Cardiovascular Exercise Tolerance: Good hypertension, Pt. on medications (-) angina(-) DOE Normal cardiovascular exam     Neuro/Psych PSYCHIATRIC DISORDERS negative neurological ROS     GI/Hepatic Neg liver ROS, GERD  Controlled,  Endo/Other  negative endocrine ROS  Renal/GU      Musculoskeletal   Abdominal   Peds  Hematology negative hematology ROS (+)   Anesthesia Other Findings Past Medical History: No date: Back pain No date: Cataract cortical, senile, left No date: DDD (degenerative disc disease), cervical No date: Decreased libido No date: Diverticulitis No date: Diverticulosis No date: Environmental allergies     Comment:  seasonal No date: GERD (gastroesophageal reflux disease) No date: Heart murmur     Comment:  1970s No date: HPV test positive No date: Hyperlipidemia No date: Hypertension No date: Menopausal and postmenopausal disorder No date: Menopause No date: Osteoarthritis No date: Osteopenia No date: Osteoporosis No date: Postmenopausal atrophic vaginitis No date: Rectocele No date: Shoulder pain, right     Comment:  rotator cuff tear No date: Thyroid nodule  Past Surgical History: 2000: BREAST CYST ASPIRATION; Bilateral     Comment:  benign No date: BUNIONECTOMY; Left No date: CATARACT EXTRACTION; Left No date: COLONOSCOPY     Comment:  1993, 2005, 2009 03/31/2018: COLONOSCOPY WITH PROPOFOL; N/A     Comment:  Procedure: COLONOSCOPY WITH  PROPOFOL;  Surgeon: Manya Silvas, MD;  Location: [facility] ENDOSCOPY;  Service:               Endoscopy;  Laterality: N/A; No date: CRYOTHERAPY 1993: ESOPHAGOGASTRODUODENOSCOPY 05/24/2020: ESOPHAGOGASTRODUODENOSCOPY 03/31/2018: ESOPHAGOGASTRODUODENOSCOPY (EGD) WITH PROPOFOL; N/A     Comment:  Procedure: ESOPHAGOGASTRODUODENOSCOPY (EGD) WITH               PROPOFOL;  Surgeon: Manya Silvas, MD;  Location:               [facility] ENDOSCOPY;  Service: Endoscopy;  Laterality: N/A; 10/28/2017: SHOULDER ARTHROSCOPY; Right     Comment:  Procedure: ARTHROSCOPY SHOULDER EITH DEBRIDEMENT               DECOMPRESSIOIN AND REAPIR OF ROTATOR CUFF REPAIR;                Surgeon: Corky Mull, MD;  Location: [place];  Service: Orthopedics;  Laterality: Right; 08/07/2014: TOTAL HIP ARTHROPLASTY; Right No date: TUBAL LIGATION  BMI    Body Mass Index: 24.09 kg/m      Reproductive/Obstetrics negative OB ROS                             Anesthesia Physical  Anesthesia Plan  ASA: 2  Anesthesia Plan: General LMA   Post-op Pain Management: Ofirmev IV (intra-op)* and Toradol IV (intra-op)*   Induction: Intravenous  PONV Risk Score and Plan:  3 and Dexamethasone, Ondansetron, Midazolam and Treatment may vary due to age or medical condition  Airway Management Planned: LMA  Additional Equipment:   Intra-op Plan:   Post-operative Plan: Extubation in OR  Informed Consent: I have reviewed the patients History and Physical, chart, labs and discussed the procedure including the risks, benefits and alternatives for the proposed anesthesia with the patient or authorized representative who has indicated his/her understanding and acceptance.     Dental Advisory Given  Plan Discussed with: Anesthesiologist, CRNA and Surgeon  Anesthesia Plan Comments: (Patient consented for risks of anesthesia including but not limited to:  - adverse  reactions to medications - damage to eyes, teeth, lips or other oral mucosa - nerve damage due to positioning  - sore throat or hoarseness - Damage to heart, brain, nerves, lungs, other parts of body or loss of life  Patient voiced understanding.)        Anesthesia Quick Evaluation

## 2022-02-04 NOTE — Transfer of Care (Signed)
Immediate Anesthesia Transfer of Care Note  Patient: Christina Foley  Procedure(s) Performed: Left little finger hardware removal (Left: Finger)  Patient Location: PACU  Anesthesia Type:General  Level of Consciousness: awake, alert  and oriented  Airway & Oxygen Therapy: Patient Spontanous Breathing and Patient connected to face mask oxygen  Post-op Assessment: Report given to RN and Post -op Vital signs reviewed and stable  Post vital signs: Reviewed and stable  Last Vitals:  Vitals Value Taken Time  BP 116/75   Temp    Pulse 81 02/04/22 1429  Resp 15 02/04/22 1429  SpO2 100 % 02/04/22 1429  Vitals shown include unvalidated device data.  Last Pain:  Vitals:   02/04/22 1049  TempSrc: Temporal  PainSc: 0-No pain         Complications: No notable events documented.

## 2022-02-04 NOTE — Anesthesia Postprocedure Evaluation (Signed)
Anesthesia Post Note  Patient: Christina Foley  Procedure(s) Performed: Left little finger hardware removal (Left: Finger)  Patient location during evaluation: PACU Anesthesia Type: General Level of consciousness: awake and alert Pain management: pain level controlled Vital Signs Assessment: post-procedure vital signs reviewed and stable Respiratory status: spontaneous breathing, nonlabored ventilation, respiratory function stable and patient connected to nasal cannula oxygen Cardiovascular status: blood pressure returned to baseline and stable Postop Assessment: no apparent nausea or vomiting Anesthetic complications: no   No notable events documented.   Last Vitals:  Vitals:   02/04/22 1430 02/04/22 1445  BP: 119/69 131/66  Pulse: 71 82  Resp: 18 18  Temp: 37.1 C 36.8 C  SpO2: 100% 97%    Last Pain:  Vitals:   02/04/22 1445  TempSrc:   PainSc: 0-No pain                 Arita Miss

## 2022-02-04 NOTE — Anesthesia Procedure Notes (Signed)
Procedure Name: LMA Insertion Date/Time: 02/04/2022 1:56 PM Performed by: Rolla Plate, CRNA Pre-anesthesia Checklist: Patient identified, Patient being monitored, Timeout performed, Emergency Drugs available and Suction available Patient Re-evaluated:Patient Re-evaluated prior to induction Oxygen Delivery Method: Circle system utilized Preoxygenation: Pre-oxygenation with 100% oxygen Induction Type: IV induction Ventilation: Mask ventilation without difficulty LMA: LMA inserted LMA Size: 3.0 Tube type: Oral Number of attempts: 1 Placement Confirmation: positive ETCO2 and breath sounds checked- equal and bilateral Tube secured with: Tape Dental Injury: Teeth and Oropharynx as per pre-operative assessment

## 2022-02-04 NOTE — Discharge Instructions (Addendum)
Keep splint clean and dry Keep arm elevated to help with swelling Numbing medicine has been placed that should last about 10 tonight.  May have pain after that Try to leave splint on to hold the finger down until your return visit Call office if you are having [place]   The drugs that you were given will stay in your system until tomorrow so for the next 24 hours you should not:  Drive an automobile Make any legal decisions Drink any alcoholic beverage   You may resume regular meals tomorrow.  Today it is better to start with liquids and gradually work up to solid foods.  You may eat anything you prefer, but it is better to start with liquids, then soup and crackers, and gradually work up to solid foods.   Please notify your doctor immediately if you have any unusual bleeding, trouble breathing, redness and pain at the surgery site, drainage, fever, or pain not relieved by medication.    Additional Instructions:        Please contact your physician with any problems or Same Day Surgery at [phone], Monday through Friday 6 am to 4 pm, or [place] [facility] number at [phone].

## 2022-02-05 ENCOUNTER — Encounter: Payer: Self-pay | Admitting: Orthopedic Surgery

## 2022-02-11 ENCOUNTER — Encounter: Payer: Self-pay | Admitting: Occupational Therapy

## 2022-02-11 ENCOUNTER — Ambulatory Visit: Payer: PPO | Attending: Orthopedic Surgery | Admitting: Occupational Therapy

## 2022-02-11 DIAGNOSIS — M79642 Pain in left hand: Secondary | ICD-10-CM | POA: Insufficient documentation

## 2022-02-11 DIAGNOSIS — M25642 Stiffness of left hand, not elsewhere classified: Secondary | ICD-10-CM | POA: Diagnosis not present

## 2022-02-11 DIAGNOSIS — M6281 Muscle weakness (generalized): Secondary | ICD-10-CM | POA: Insufficient documentation

## 2022-02-11 DIAGNOSIS — L905 Scar conditions and fibrosis of skin: Secondary | ICD-10-CM | POA: Diagnosis not present

## 2022-02-11 NOTE — Therapy (Signed)
[facility] [address], [place], [address] Phone: [phone]   Fax:  [phone]  Occupational Therapy Treatment/Return after hardware removal:  Patient Details  Name: Christina Foley MRN: [id number] Date of Birth: [birth year] No data recorded  Encounter Date: 02/11/2022   OT End of Session - 02/11/22 1211     Visit Number 11    Number of Visits 25    Date for OT Re-Evaluation 03/25/22    OT Start Time 1115    OT Stop Time 1201    OT Time Calculation (min) 46 min    Activity Tolerance Patient tolerated treatment well    Behavior During Therapy [facility] for tasks assessed/performed             Past Medical History:  Diagnosis Date   Back pain    Cataract cortical, senile, left    DDD (degenerative disc disease), cervical    Decreased libido    Diverticulitis    Diverticulosis    Environmental allergies    seasonal   GERD (gastroesophageal reflux disease)    Heart murmur    1970s   HPV test positive    Hyperlipidemia    Hypertension    Menopausal and postmenopausal disorder    Menopause    Osteoarthritis    Osteopenia    Osteoporosis    Pneumonia    Postmenopausal atrophic vaginitis    Rectocele    Shoulder pain, right    rotator cuff tear   Thyroid nodule     Past Surgical History:  Procedure Laterality Date   BREAST CYST ASPIRATION Bilateral 2000   benign   BUNIONECTOMY Left    CATARACT EXTRACTION Left    COLONOSCOPY     1993, 2005, 2009   COLONOSCOPY WITH PROPOFOL N/A 03/31/2018   Procedure: COLONOSCOPY WITH PROPOFOL;  Surgeon: Manya Silvas, MD;  Location: [facility] ENDOSCOPY;  Service: Endoscopy;  Laterality: N/A;   CRYOTHERAPY     ESOPHAGOGASTRODUODENOSCOPY  1993   ESOPHAGOGASTRODUODENOSCOPY  05/24/2020   ESOPHAGOGASTRODUODENOSCOPY (EGD) WITH PROPOFOL N/A 03/31/2018   Procedure: ESOPHAGOGASTRODUODENOSCOPY (EGD) WITH PROPOFOL;  Surgeon: Manya Silvas, MD;   Location: [facility] ENDOSCOPY;  Service: Endoscopy;  Laterality: N/A;   HARDWARE REMOVAL Left 02/04/2022   Procedure: Left little finger hardware removal;  Surgeon: Hessie Knows, MD;  Location: [facility] ORS;  Service: Orthopedics;  Laterality: Left;   OPEN REDUCTION INTERNAL FIXATION (ORIF) PROXIMAL PHALANX Left 11/14/2021   Procedure: Left, little finger proximal phalanx ORIF;  Surgeon: Hessie Knows, MD;  Location: [facility] ORS;  Service: Orthopedics;  Laterality: Left;  left little finger   SHOULDER ARTHROSCOPY Right 10/28/2017   Procedure: ARTHROSCOPY SHOULDER EITH DEBRIDEMENT DECOMPRESSIOIN AND REAPIR OF ROTATOR CUFF REPAIR;  Surgeon: Corky Mull, MD;  Location: [place];  Service: Orthopedics;  Laterality: Right;   TOTAL HIP ARTHROPLASTY Right 08/07/2014   TUBAL LIGATION      There were no vitals filed for this visit.   Subjective Assessment - 02/11/22 1205     Subjective  Is being get my hardware was taken about a week ago -doing okay but is a little more sore than last time.  He did had me in a wrap all the way up and down.  And referred me back to you to see you 3 times a week for 3 weeks.  Stitches is coming out 13th June    Pertinent History Christina Foley is a 76 y.o. female here today  status post left little finger ORIF by Dr. Hessie Knows on 11/14/2021. Patient is doing well. Pain well controlled. Still having some limitation with flexion and slight limitation with extension - had hand therapy but needed hard ware removal - seen Dr Rudene Christians 02/07/22 for follow-up evaluation status post left small finger hardware removal performed on 02/04/2022. She is here for initial evaluation after removal. She was splinted in flexion because she was having trouble flexion. Full flexion could be obtained. She comes back now for splint removal and starting range of motion. refer to OT    Patient Stated Goals Want my motion and strength back in L hand to return to my prior activities and travelling     Currently in Pain? Yes    Pain Score 4     Pain Location Finger (Comment which one)    Pain Orientation Left    Pain Descriptors / Indicators Aching;Tender;Tightness    Pain Type Surgical pain    Pain Frequency Intermittent                [facility] OT Assessment - 02/11/22 0001       Left Hand AROM   L Little  MCP 0-90 80 Degrees    L Little PIP 0-100 75 Degrees   -40 in session -35; Flexion in session 80 A, 90 P   L Little DIP 0-70 45 Degrees                   Assess active and passive range of motion prior to moist heat.  Patient bandage on over    OT Treatments/Exercises (OP) - 02/11/22 0001       Moist Heat Therapy   Number Minutes Moist Heat 8 Minutes    Moist Heat Location Hand   Done several x2 minutes at a time with hand in a fist in between passive range of motion               Replaced with gloves on being 8 with stockinettes for digits done for rotations into heat with hand in a fist.  Prior to passive range of motion several times repeated between flexion and extension for PIP MCP and DIP.  Reviewed with patient home program for intrinsic passive range of motion for PIP flexion and then composite 3-5 times a day with moist heat.  If patient has increased edema or pain to use to do contrast and back off on home programs.  Keep pain under 3/10.  In session this date pain was between a 4-5/10.  Patient with moist heat tolerated treatment well. Patient to do in between lumbrical flexion with PIP extension.      OT Education - 02/11/22 1211     Education Details Assessment and changes to home program    Person(s) Educated Patient    Methods Explanation;Demonstration;Verbal cues;Handout    Comprehension Verbalized understanding;Returned demonstration;Verbal cues required              OT Short Term Goals - 02/11/22 1218       OT SHORT TERM GOAL #1   Title 1. Patient to be independent in home program to decrease edema and decrease scar tissue  to increase motion in left fifth digit    Baseline Stitches in place, edema - has coban on  -AROM 80MC, PIP 75 and ext -40 PIP    Time 3    Period Weeks    Status New    Target Date 03/04/22  OT Long Term Goals - 02/11/22 1220       OT LONG TERM GOAL #1   Title Patient left fifth digit flexion active range of motion increase for patient to touch palm to drive and squeeze washcloth    Baseline MC80, PIP 75, AROM    Time 6    Period Weeks    Status New    Target Date 03/25/22      OT LONG TERM GOAL #2   Title 2.  Patient able to make composite fist with left fifth digit to squeeze washcloth with no increase symptoms    Baseline pain 4/10 -5/10 - AROM MC 80, PIP 75 - stitches in place 1 wk out from surgery    Time 6    Period Weeks    Status New    Target Date 03/25/22      OT LONG TERM GOAL #3   Title 3.  Patient's left grip strength increased to more than 75% compared to right hand to return to crafts and art activities without increase symptoms    Baseline NO strength yet - 1 wk out from hardware removal - AROM 80 MC , PIP 75- PIP ext -40 - pain 4-5/10    Time 6    Period Weeks    Status New    Target Date 03/25/22                   Plan - 02/11/22 1213     Clinical Impression Statement Patient s/p with left fifth ORIF of proximal phalanges on 11/14/21-patient was seen for therapy for several weeks with limited progress in endrange of flexion and extension limited by hardware.  Patient had hardware removed on 02/04/2022 and referred by orthopedist to OT for 3 times a week for 3 weeks to initiate early passive range of motion-active range of motion to increase flexion and extension at L fifth digit.  Patient was bandaged into composite flexion for the first week.  Patient's pain coming in and in session stayed about 4-5/10.  Patient showed increase passive and active range of motion during session and moist heat to PIP  80- 90 degrees.  Stitches will be  removed on 13 June.  Patient to keep bandage on as well as buddy strapped per Doctor's order.  Patient can continue to benefit from OT services to decrease scar tissue, decrease edema and increase range of motion and strength to return to previous level of function..    OT Occupational Profile and History Problem Focused Assessment - Including review of records relating to presenting problem    Occupational performance deficits (Please refer to evaluation for details): ADL's;IADL's;Leisure    Body Structure / Function / Physical Skills ADL;Scar mobility;UE functional use;IADL;Pain;Dexterity;FMC;ROM;Edema    Psychosocial Skills Habits;Environmental  Adaptations;Routines and Behaviors    Rehab Potential Good    Clinical Decision Making Limited treatment options, no task modification necessary    Comorbidities Affecting Occupational Performance: None    Modification or Assistance to Complete Evaluation  No modification of tasks or assist necessary to complete eval    OT Frequency 3x / week   3 x wk for 3 wks, 2 x wk for 3 wks   OT Duration 6 weeks    OT Treatment/Interventions Self-care/ADL training;Cryotherapy;Paraffin;Therapeutic exercise;Fluidtherapy;Neuromuscular education;Manual Therapy;Scar mobilization;Splinting;Moist Heat;Contrast Bath;Energy conservation;Passive range of motion;Therapeutic activities;Patient/family education    Consulted and Agree with Plan of Care Patient             Patient  will benefit from skilled therapeutic intervention in order to improve the following deficits and impairments:   Body Structure / Function / Physical Skills: ADL, Scar mobility, UE functional use, IADL, Pain, Dexterity, FMC, ROM, Edema   Psychosocial Skills: Habits, Environmental  Adaptations, Routines and Behaviors   Visit Diagnosis: Muscle weakness (generalized) - Plan: Ot plan of care cert/re-cert  Stiffness of left hand, not elsewhere classified - Plan: Ot plan of care cert/re-cert  Pain  in left hand - Plan: Ot plan of care cert/re-cert  Scar tissue - Plan: Ot plan of care cert/re-cert    Problem List Patient Active Problem List   Diagnosis Date Noted   Vaginal atrophy 02/07/2016   Vulvitis 02/07/2016   Menopause 02/07/2016   Osteopenia 02/07/2016   HPV test positive 02/07/2016   Essential hypertension 02/07/2016   Diverticulosis 02/07/2016    Rosalyn Gess, OTR/L,CLT 02/11/2022, 12:24 PM  [facility] [address], [place], [address] Phone: [phone]   Fax:  [phone]  Name: Christina Foley MRN: [id number] Date of Birth: July 05, 1946

## 2022-02-13 ENCOUNTER — Ambulatory Visit: Payer: PPO | Admitting: Occupational Therapy

## 2022-02-13 DIAGNOSIS — M25642 Stiffness of left hand, not elsewhere classified: Secondary | ICD-10-CM

## 2022-02-13 DIAGNOSIS — L905 Scar conditions and fibrosis of skin: Secondary | ICD-10-CM

## 2022-02-13 DIAGNOSIS — M6281 Muscle weakness (generalized): Secondary | ICD-10-CM

## 2022-02-13 DIAGNOSIS — M79642 Pain in left hand: Secondary | ICD-10-CM

## 2022-02-14 ENCOUNTER — Ambulatory Visit: Payer: PPO | Admitting: Occupational Therapy

## 2022-02-14 DIAGNOSIS — M5416 Radiculopathy, lumbar region: Secondary | ICD-10-CM | POA: Diagnosis not present

## 2022-02-14 DIAGNOSIS — M79642 Pain in left hand: Secondary | ICD-10-CM

## 2022-02-14 DIAGNOSIS — M25642 Stiffness of left hand, not elsewhere classified: Secondary | ICD-10-CM

## 2022-02-14 DIAGNOSIS — M6281 Muscle weakness (generalized): Secondary | ICD-10-CM

## 2022-02-14 DIAGNOSIS — L905 Scar conditions and fibrosis of skin: Secondary | ICD-10-CM

## 2022-02-17 ENCOUNTER — Encounter: Payer: Self-pay | Admitting: Occupational Therapy

## 2022-02-17 NOTE — Therapy (Signed)
[facility] [address] [place], [place], [address] Phone: [phone]   Fax:  [phone]  Occupational Therapy Treatment  Patient Details  Name: Christina Foley MRN: [id number] Date of Birth: Apr 12, 1946 No data recorded  Encounter Date: 02/13/2022   OT End of Session - 02/17/22 1627     Visit Number 12    Number of Visits 25    Date for OT Re-Evaluation 03/25/22    OT Start Time 1118    OT Stop Time 1205    OT Time Calculation (min) 47 min    Activity Tolerance Patient tolerated treatment well    Behavior During Therapy [facility] for tasks assessed/performed             Past Medical History:  Diagnosis Date   Back pain    Cataract cortical, senile, left    DDD (degenerative disc disease), cervical    Decreased libido    Diverticulitis    Diverticulosis    Environmental allergies    seasonal   GERD (gastroesophageal reflux disease)    Heart murmur    1970s   HPV test positive    Hyperlipidemia    Hypertension    Menopausal and postmenopausal disorder    Menopause    Osteoarthritis    Osteopenia    Osteoporosis    Pneumonia    Postmenopausal atrophic vaginitis    Rectocele    Shoulder pain, right    rotator cuff tear   Thyroid nodule     Past Surgical History:  Procedure Laterality Date   BREAST CYST ASPIRATION Bilateral 2000   benign   BUNIONECTOMY Left    CATARACT EXTRACTION Left    COLONOSCOPY     1993, 2005, 2009   COLONOSCOPY WITH PROPOFOL N/A 03/31/2018   Procedure: COLONOSCOPY WITH PROPOFOL;  Surgeon: Manya Silvas, MD;  Location: [facility] ENDOSCOPY;  Service: Endoscopy;  Laterality: N/A;   CRYOTHERAPY     ESOPHAGOGASTRODUODENOSCOPY  1993   ESOPHAGOGASTRODUODENOSCOPY  05/24/2020   ESOPHAGOGASTRODUODENOSCOPY (EGD) WITH PROPOFOL N/A 03/31/2018   Procedure: ESOPHAGOGASTRODUODENOSCOPY (EGD) WITH PROPOFOL;  Surgeon: Manya Silvas, MD;  Location: [facility] ENDOSCOPY;  Service:  Endoscopy;  Laterality: N/A;   HARDWARE REMOVAL Left 02/04/2022   Procedure: Left little finger hardware removal;  Surgeon: Hessie Knows, MD;  Location: [facility] ORS;  Service: Orthopedics;  Laterality: Left;   OPEN REDUCTION INTERNAL FIXATION (ORIF) PROXIMAL PHALANX Left 11/14/2021   Procedure: Left, little finger proximal phalanx ORIF;  Surgeon: Hessie Knows, MD;  Location: [facility] ORS;  Service: Orthopedics;  Laterality: Left;  left little finger   SHOULDER ARTHROSCOPY Right 10/28/2017   Procedure: ARTHROSCOPY SHOULDER EITH DEBRIDEMENT DECOMPRESSIOIN AND REAPIR OF ROTATOR CUFF REPAIR;  Surgeon: Corky Mull, MD;  Location: [place];  Service: Orthopedics;  Laterality: Right;   TOTAL HIP ARTHROPLASTY Right 08/07/2014   TUBAL LIGATION      There were no vitals filed for this visit.   Subjective Assessment - 02/17/22 1605     Subjective  Pt reports she is doing well, will be going back to see the doctor next week.    Pertinent History Christina Foley is a 76 y.o. female here today status post left little finger ORIF by Dr. Hessie Knows on 11/14/2021. Patient is doing well. Pain well controlled. Still having some limitation with flexion and slight limitation with extension - had hand therapy but needed hard ware removal - seen Dr Rudene Christians 02/07/22 for follow-up evaluation  status post left small finger hardware removal performed on 02/04/2022. She is here for initial evaluation after removal. She was splinted in flexion because she was having trouble flexion. Full flexion could be obtained. She comes back now for splint removal and starting range of motion. refer to OT    Patient Stated Goals Want my motion and strength back in L hand to return to my prior activities and travelling    Currently in Pain? Yes    Pain Score 2     Pain Location Finger (Comment which one)    Pain Orientation Left    Pain Descriptors / Indicators Aching;Tender;Tightness    Pain Type Surgical pain    Pain Onset  More than a month ago    Pain Frequency Intermittent              Pt seen this date for moist heat for 5 mins prior to ROM, then performing PROM with alternating periods of heat between sets.  Pt seen for PROM for flexion, extension for MCP, PIP and DIP, multiple rounds completed, blocking at MCP.   INtrinsic passive range of motion for PIP flexion and then composite, to perform 3-5 times a day.   Lumbrical flexion with PIP extension.  No increased edema this date and pain decreased to 2/10.      [facility] OT Assessment - 02/17/22 1646       Left Hand AROM   L Little  MCP 0-90 90 Degrees    L Little PIP 0-100 80 Degrees   -30 extension active   L Little DIP 0-70 45 Degrees                              OT Education - 02/17/22 1627     Education Details exercises, ROM    Person(s) Educated Patient    Methods Explanation;Demonstration;Verbal cues;Handout    Comprehension Verbalized understanding;Returned demonstration;Verbal cues required              OT Short Term Goals - 02/11/22 1218       OT SHORT TERM GOAL #1   Title 1. Patient to be independent in home program to decrease edema and decrease scar tissue to increase motion in left fifth digit    Baseline Stitches in place, edema - has coban on  -AROM 80MC, PIP 75 and ext -40 PIP    Time 3    Period Weeks    Status New    Target Date 03/04/22               OT Long Term Goals - 02/11/22 1220       OT LONG TERM GOAL #1   Title Patient left fifth digit flexion active range of motion increase for patient to touch palm to drive and squeeze washcloth    Baseline MC80, PIP 75, AROM    Time 6    Period Weeks    Status New    Target Date 03/25/22      OT LONG TERM GOAL #2   Title 2.  Patient able to make composite fist with left fifth digit to squeeze washcloth with no increase symptoms    Baseline pain 4/10 -5/10 - AROM MC 80, PIP 75 - stitches in place 1 wk out from surgery    Time 6     Period Weeks    Status New    Target Date 03/25/22  OT LONG TERM GOAL #3   Title 3.  Patient's left grip strength increased to more than 75% compared to right hand to return to crafts and art activities without increase symptoms    Baseline NO strength yet - 1 wk out from hardware removal - AROM 80 MC , PIP 75- PIP ext -40 - pain 4-5/10    Time 6    Period Weeks    Status New    Target Date 03/25/22                   Plan - 02/17/22 1627     Clinical Impression Statement Patient s/p with left fifth ORIF of proximal phalanges on 11/14/21-patient was seen for therapy for several weeks with limited progress in endrange of flexion and extension limited by hardware.  Patient had hardware removed on 02/04/2022 and referred by orthopedist to OT for 3 times a week for 3 weeks to initiate early passive range of motion-active range of motion to increase flexion and extension at L fifth digit.  Patient was bandaged into composite flexion for the first week.  Patient's pain coming in and in session stayed about 4-5/10.  Patient showed increase passive and active range of motion during session and moist heat to PIP  80- 90 degrees.  Stitches will be removed on 13 June.  Patient to keep bandage on as well as buddy strapped per Doctor's order.  Patient can continue to benefit from OT services to decrease scar tissue, decrease edema and increase range of motion and strength to return to previous level of function..    OT Occupational Profile and History Problem Focused Assessment - Including review of records relating to presenting problem    Occupational performance deficits (Please refer to evaluation for details): ADL's;IADL's;Leisure    Body Structure / Function / Physical Skills ADL;Scar mobility;UE functional use;IADL;Pain;Dexterity;FMC;ROM;Edema    Psychosocial Skills Habits;Environmental  Adaptations;Routines and Behaviors    Rehab Potential Good    Clinical Decision Making Limited treatment  options, no task modification necessary    Comorbidities Affecting Occupational Performance: None    Modification or Assistance to Complete Evaluation  No modification of tasks or assist necessary to complete eval    OT Frequency 3x / week   3 x wk for 3 wks, 2 x wk for 3 wks   OT Duration 6 weeks    OT Treatment/Interventions Self-care/ADL training;Cryotherapy;Paraffin;Therapeutic exercise;Fluidtherapy;Neuromuscular education;Manual Therapy;Scar mobilization;Splinting;Moist Heat;Contrast Bath;Energy conservation;Passive range of motion;Therapeutic activities;Patient/family education    Consulted and Agree with Plan of Care Patient             Patient will benefit from skilled therapeutic intervention in order to improve the following deficits and impairments:   Body Structure / Function / Physical Skills: ADL, Scar mobility, UE functional use, IADL, Pain, Dexterity, FMC, ROM, Edema   Psychosocial Skills: Habits, Environmental  Adaptations, Routines and Behaviors   Visit Diagnosis: Muscle weakness (generalized)  Stiffness of left hand, not elsewhere classified  Pain in left hand  Scar tissue    Problem List Patient Active Problem List   Diagnosis Date Noted   Vaginal atrophy 02/07/2016   Vulvitis 02/07/2016   Menopause 02/07/2016   Osteopenia 02/07/2016   HPV test positive 02/07/2016   Essential hypertension 02/07/2016   Diverticulosis 02/07/2016   Christina Foley, OTR/L, CLT  Nigel Ericsson, OT 02/17/2022, 5:01 PM  [facility] [address], [place], [address] Phone: [phone]  Fax:  [phone]  Name: KAILAN CARMEN MRN: [id number] Date of Birth: Mar 15, 1946

## 2022-02-17 NOTE — Therapy (Signed)
[facility] [address] [place], [place], [address] Phone: [phone]   Fax:  [phone]  Occupational Therapy Treatment  Patient Details  Name: Christina Foley MRN: [id number] Date of Birth: 1945/10/22 No data recorded  Encounter Date: 02/14/2022   OT End of Session - 02/17/22 2114     Visit Number 13    Number of Visits 25    Date for OT Re-Evaluation 03/25/22    OT Start Time 7    OT Stop Time 1214    OT Time Calculation (min) 44 min    Activity Tolerance Patient tolerated treatment well    Behavior During Therapy [facility] for tasks assessed/performed             Past Medical History:  Diagnosis Date   Back pain    Cataract cortical, senile, left    DDD (degenerative disc disease), cervical    Decreased libido    Diverticulitis    Diverticulosis    Environmental allergies    seasonal   GERD (gastroesophageal reflux disease)    Heart murmur    1970s   HPV test positive    Hyperlipidemia    Hypertension    Menopausal and postmenopausal disorder    Menopause    Osteoarthritis    Osteopenia    Osteoporosis    Pneumonia    Postmenopausal atrophic vaginitis    Rectocele    Shoulder pain, right    rotator cuff tear   Thyroid nodule     Past Surgical History:  Procedure Laterality Date   BREAST CYST ASPIRATION Bilateral 2000   benign   BUNIONECTOMY Left    CATARACT EXTRACTION Left    COLONOSCOPY     1993, 2005, 2009   COLONOSCOPY WITH PROPOFOL N/A 03/31/2018   Procedure: COLONOSCOPY WITH PROPOFOL;  Surgeon: Manya Silvas, MD;  Location: [facility] ENDOSCOPY;  Service: Endoscopy;  Laterality: N/A;   CRYOTHERAPY     ESOPHAGOGASTRODUODENOSCOPY  1993   ESOPHAGOGASTRODUODENOSCOPY  05/24/2020   ESOPHAGOGASTRODUODENOSCOPY (EGD) WITH PROPOFOL N/A 03/31/2018   Procedure: ESOPHAGOGASTRODUODENOSCOPY (EGD) WITH PROPOFOL;  Surgeon: Manya Silvas, MD;  Location: [facility] ENDOSCOPY;  Service:  Endoscopy;  Laterality: N/A;   HARDWARE REMOVAL Left 02/04/2022   Procedure: Left little finger hardware removal;  Surgeon: Hessie Knows, MD;  Location: [facility] ORS;  Service: Orthopedics;  Laterality: Left;   OPEN REDUCTION INTERNAL FIXATION (ORIF) PROXIMAL PHALANX Left 11/14/2021   Procedure: Left, little finger proximal phalanx ORIF;  Surgeon: Hessie Knows, MD;  Location: [facility] ORS;  Service: Orthopedics;  Laterality: Left;  left little finger   SHOULDER ARTHROSCOPY Right 10/28/2017   Procedure: ARTHROSCOPY SHOULDER EITH DEBRIDEMENT DECOMPRESSIOIN AND REAPIR OF ROTATOR CUFF REPAIR;  Surgeon: Corky Mull, MD;  Location: [place];  Service: Orthopedics;  Laterality: Right;   TOTAL HIP ARTHROPLASTY Right 08/07/2014   TUBAL LIGATION      There were no vitals filed for this visit.   Subjective Assessment - 02/17/22 2112     Subjective  Had another appt this morning and hasn't had a chance yet to do exercises.  Pain none at rest.  Was a little sore yesterday after therapy.  Going to PT now for sciatica and hoping she will not have any issues with her upcoming trip to [place].    Pertinent History Christina Foley is a 76 y.o. female here today status post left little finger ORIF by Dr. Hessie Knows on 11/14/2021. Patient is doing  well. Pain well controlled. Still having some limitation with flexion and slight limitation with extension - had hand therapy but needed hard ware removal - seen Dr Rudene Christians 02/07/22 for follow-up evaluation status post left small finger hardware removal performed on 02/04/2022. She is here for initial evaluation after removal. She was splinted in flexion because she was having trouble flexion. Full flexion could be obtained. She comes back now for splint removal and starting range of motion. refer to OT    Patient Stated Goals Want my motion and strength back in L hand to return to my prior activities and travelling    Pain Score 2     Pain Location Finger (Comment  which one)    Pain Orientation Left    Pain Descriptors / Indicators Aching;Tightness;Tender    Pain Type Surgical pain    Pain Onset More than a month ago    Pain Frequency Intermittent            Pt reports she is a little sore from yesterday's session, no pain at rest but mild pain 1-2/10 with ROM exercises during session.    Pt seen this date for moist heat for 4-5 mins prior to ROM, then performing PROM with alternating periods of heat between sets.  Pt seen for PROM for flexion, extension for MCP, PIP and DIP, multiple rounds completed.  AROM with measurements, blocking at MCP.    Continue with Intrinsic passive range of motion for PIP flexion and then composite, to perform 3-5 times a day.   Lumbrical flexion with PIP extension.   Pt with mild edema at the base of the ring finger on the left, discussed use of contrast for any edema in hand over the weekend.    Pt has appt next week on the 13th to get stitches out, continue with OT 3 times a week to work on motion following hardware removal   [facility] OT Assessment - 02/17/22 2123       Left Hand AROM   L Little  MCP 0-90 90 Degrees    L Little PIP 0-100 80 Degrees    L Little DIP 0-70 48 Degrees                              OT Education - 02/17/22 2113     Education Details exercises, ROM    Person(s) Educated Patient    Methods Explanation;Demonstration;Verbal cues;Handout    Comprehension Verbalized understanding;Returned demonstration;Verbal cues required              OT Short Term Goals - 02/11/22 1218       OT SHORT TERM GOAL #1   Title 1. Patient to be independent in home program to decrease edema and decrease scar tissue to increase motion in left fifth digit    Baseline Stitches in place, edema - has coban on  -AROM 80MC, PIP 75 and ext -40 PIP    Time 3    Period Weeks    Status New    Target Date 03/04/22               OT Long Term Goals - 02/11/22 1220       OT LONG  TERM GOAL #1   Title Patient left fifth digit flexion active range of motion increase for patient to touch palm to drive and squeeze washcloth    Baseline MC80, PIP 75, AROM    Time  6    Period Weeks    Status New    Target Date 03/25/22      OT LONG TERM GOAL #2   Title 2.  Patient able to make composite fist with left fifth digit to squeeze washcloth with no increase symptoms    Baseline pain 4/10 -5/10 - AROM MC 80, PIP 75 - stitches in place 1 wk out from surgery    Time 6    Period Weeks    Status New    Target Date 03/25/22      OT LONG TERM GOAL #3   Title 3.  Patient's left grip strength increased to more than 75% compared to right hand to return to crafts and art activities without increase symptoms    Baseline NO strength yet - 1 wk out from hardware removal - AROM 80 MC , PIP 75- PIP ext -40 - pain 4-5/10    Time 6    Period Weeks    Status New    Target Date 03/25/22                   Plan - 02/17/22 2114     Clinical Impression Statement Patient s/p with left fifth ORIF of proximal phalanges on 11/14/21-patient was seen for therapy for several weeks with limited progress in endrange of flexion and extension limited by hardware.  Patient had hardware removed on 02/04/2022 and referred by orthopedist to OT for 3 times a week for 3 weeks to initiate early passive range of motion-active range of motion to increase flexion and extension at L fifth digit.  Patient was bandaged into composite flexion for the first week.  Some mild soreness this date after therapy session yesterday but no pain at rest, monitored pain this date during session.  Patient continues to demonstrate increase passive and active range of motion during session and moist heat to PIP  80- 90 degrees.  Stitches will be removed on 13 June.  Patient to keep bandage on as well as buddy strapped per Doctor's order.  Patient can continue to benefit from OT services to decrease scar tissue, decrease edema and  increase range of motion and strength to return to previous level of function..    OT Occupational Profile and History Problem Focused Assessment - Including review of records relating to presenting problem    Occupational performance deficits (Please refer to evaluation for details): ADL's;IADL's;Leisure    Body Structure / Function / Physical Skills ADL;Scar mobility;UE functional use;IADL;Pain;Dexterity;FMC;ROM;Edema    Psychosocial Skills Habits;Environmental  Adaptations;Routines and Behaviors    Rehab Potential Good    Clinical Decision Making Limited treatment options, no task modification necessary    Comorbidities Affecting Occupational Performance: None    Modification or Assistance to Complete Evaluation  No modification of tasks or assist necessary to complete eval    OT Frequency 3x / week   3 x wk for 3 wks, 2 x wk for 3 wks   OT Duration 6 weeks    OT Treatment/Interventions Self-care/ADL training;Cryotherapy;Paraffin;Therapeutic exercise;Fluidtherapy;Neuromuscular education;Manual Therapy;Scar mobilization;Splinting;Moist Heat;Contrast Bath;Energy conservation;Passive range of motion;Therapeutic activities;Patient/family education    Consulted and Agree with Plan of Care Patient             Patient will benefit from skilled therapeutic intervention in order to improve the following deficits and impairments:   Body Structure / Function / Physical Skills: ADL, Scar mobility, UE functional use, IADL, Pain, Dexterity, FMC, ROM, Edema   Psychosocial Skills: Habits,  Environmental  Adaptations, Routines and Behaviors   Visit Diagnosis: Muscle weakness (generalized)  Stiffness of left hand, not elsewhere classified  Pain in left hand  Scar tissue    Problem List Patient Active Problem List   Diagnosis Date Noted   Vaginal atrophy 02/07/2016   Vulvitis 02/07/2016   Menopause 02/07/2016   Osteopenia 02/07/2016   HPV test positive 02/07/2016   Essential hypertension  02/07/2016   Diverticulosis 02/07/2016   Christina Foley, OTR/L, CLT  Christina Foley, OT 02/17/2022, 9:25 PM  [place] [address] [place], [address] Phone: [phone]   Fax:  [phone]  Name: Christina Foley MRN: [id number] Date of Birth: [birth year]

## 2022-02-18 ENCOUNTER — Ambulatory Visit: Payer: PPO | Admitting: Occupational Therapy

## 2022-02-18 DIAGNOSIS — M6281 Muscle weakness (generalized): Secondary | ICD-10-CM

## 2022-02-18 DIAGNOSIS — L905 Scar conditions and fibrosis of skin: Secondary | ICD-10-CM

## 2022-02-18 DIAGNOSIS — M79642 Pain in left hand: Secondary | ICD-10-CM

## 2022-02-18 DIAGNOSIS — M25642 Stiffness of left hand, not elsewhere classified: Secondary | ICD-10-CM

## 2022-02-18 DIAGNOSIS — M5416 Radiculopathy, lumbar region: Secondary | ICD-10-CM | POA: Diagnosis not present

## 2022-02-18 NOTE — Therapy (Signed)
[place] [facility] [address], [place], [address] Phone: [phone]   Fax:  [phone]  Occupational Therapy Treatment  Patient Details  Name: Christina Foley MRN: [id number] Date of Birth: 1946/08/08 No data recorded  Encounter Date: 02/18/2022   OT End of Session - 02/18/22 1101     Visit Number 14    Number of Visits 25    Date for OT Re-Evaluation 03/25/22    OT Start Time 1029    OT Stop Time 1101    OT Time Calculation (min) 32 min    Activity Tolerance Patient tolerated treatment well    Behavior During Therapy [facility] for tasks assessed/performed             Past Medical History:  Diagnosis Date   Back pain    Cataract cortical, senile, left    DDD (degenerative disc disease), cervical    Decreased libido    Diverticulitis    Diverticulosis    Environmental allergies    seasonal   GERD (gastroesophageal reflux disease)    Heart murmur    1970s   HPV test positive    Hyperlipidemia    Hypertension    Menopausal and postmenopausal disorder    Menopause    Osteoarthritis    Osteopenia    Osteoporosis    Pneumonia    Postmenopausal atrophic vaginitis    Rectocele    Shoulder pain, right    rotator cuff tear   Thyroid nodule     Past Surgical History:  Procedure Laterality Date   BREAST CYST ASPIRATION Bilateral 2000   benign   BUNIONECTOMY Left    CATARACT EXTRACTION Left    COLONOSCOPY     1993, 2005, 2009   COLONOSCOPY WITH PROPOFOL N/A 03/31/2018   Procedure: COLONOSCOPY WITH PROPOFOL;  Surgeon: Manya Silvas, MD;  Location: [facility] ENDOSCOPY;  Service: Endoscopy;  Laterality: N/A;   CRYOTHERAPY     ESOPHAGOGASTRODUODENOSCOPY  1993   ESOPHAGOGASTRODUODENOSCOPY  05/24/2020   ESOPHAGOGASTRODUODENOSCOPY (EGD) WITH PROPOFOL N/A 03/31/2018   Procedure: ESOPHAGOGASTRODUODENOSCOPY (EGD) WITH PROPOFOL;  Surgeon: Manya Silvas, MD;  Location: [facility] ENDOSCOPY;  Service:  Endoscopy;  Laterality: N/A;   HARDWARE REMOVAL Left 02/04/2022   Procedure: Left little finger hardware removal;  Surgeon: Hessie Knows, MD;  Location: [facility] ORS;  Service: Orthopedics;  Laterality: Left;   OPEN REDUCTION INTERNAL FIXATION (ORIF) PROXIMAL PHALANX Left 11/14/2021   Procedure: Left, little finger proximal phalanx ORIF;  Surgeon: Hessie Knows, MD;  Location: [facility] ORS;  Service: Orthopedics;  Laterality: Left;  left little finger   SHOULDER ARTHROSCOPY Right 10/28/2017   Procedure: ARTHROSCOPY SHOULDER EITH DEBRIDEMENT DECOMPRESSIOIN AND REAPIR OF ROTATOR CUFF REPAIR;  Surgeon: Corky Mull, MD;  Location: [place];  Service: Orthopedics;  Laterality: Right;   TOTAL HIP ARTHROPLASTY Right 08/07/2014   TUBAL LIGATION      There were no vitals filed for this visit.   Subjective Assessment - 02/18/22 1059     Subjective  Messages is coming out this afternoon I have been trying hard to get my sessions in 4 times a day even at the beach.  I feel like I making progress    Pertinent History Christina Foley is a 76 y.o. female here today status post left little finger ORIF by Dr. Hessie Knows on 11/14/2021. Patient is doing well. Pain well controlled. Still having some limitation with flexion and slight limitation with extension - had  hand therapy but needed hard ware removal - seen Dr Rudene Christians 02/07/22 for follow-up evaluation status post left small finger hardware removal performed on 02/04/2022. She is here for initial evaluation after removal. She was splinted in flexion because she was having trouble flexion. Full flexion could be obtained. She comes back now for splint removal and starting range of motion. refer to OT    Patient Stated Goals Want my motion and strength back in L hand to return to my prior activities and travelling    Currently in Pain? Yes    Pain Score 6     Pain Location Finger (Comment which one)    Pain Orientation Left    Pain Descriptors / Indicators  Aching;Tender;Tightness    Pain Type Surgical pain    Pain Onset More than a month ago    Pain Frequency Intermittent    Aggravating Factors  with PROM at PIP 5th                [facility] OT Assessment - 02/18/22 0001       Left Hand AROM   L Little  MCP 0-90 90 Degrees    L Little PIP 0-100 85 Degrees   PROM , place and hold in session 80   L Little DIP 0-70 55 Degrees              Coming in PIP extension -25 at fifth          OT Treatments/Exercises (OP) - 02/18/22 0001       Moist Heat Therapy   Number Minutes Moist Heat 8 Minutes    Moist Heat Location Hand   Hand in a fist as well as intrinsic fist and Coban flexion wrap for composite fifth            Pt seen this date for moist heat for 8 mins  and stretching , PROM done on and off during heat Done flexion wrap coban for composite fist too  prior  and during  ROM, then performing PROM with alternating periods of heat between sets.  Pt seen for PROM for flexion, extension for MCP, PIP and DIP, multiple rounds completed, blocking at MCP.   INtrinsic passive range of motion for PIP flexion and then composite, to perform 3-5 times a day.   Lumbrical flexion with PIP extension.  No increased edema this date and pain decreased to 2/10 after PROM an stretches  Stitches to be removed later this afternoon.   Patient to have increased tenderness 7/10 over A1 pulley patient to do ice massage and make sure she does passive range of motion prior to any active range of motion       OT Education - 02/18/22 1101     Education Details exercises, ROM    Person(s) Educated Patient    Methods Explanation;Demonstration;Verbal cues;Handout    Comprehension Verbalized understanding;Returned demonstration;Verbal cues required              OT Short Term Goals - 02/11/22 1218       OT SHORT TERM GOAL #1   Title 1. Patient to be independent in home program to decrease edema and decrease scar tissue to increase  motion in left fifth digit    Baseline Stitches in place, edema - has coban on  -AROM 80MC, PIP 75 and ext -40 PIP    Time 3    Period Weeks    Status New    Target Date 03/04/22  OT Long Term Goals - 02/11/22 1220       OT LONG TERM GOAL #1   Title Patient left fifth digit flexion active range of motion increase for patient to touch palm to drive and squeeze washcloth    Baseline MC80, PIP 75, AROM    Time 6    Period Weeks    Status New    Target Date 03/25/22      OT LONG TERM GOAL #2   Title 2.  Patient able to make composite fist with left fifth digit to squeeze washcloth with no increase symptoms    Baseline pain 4/10 -5/10 - AROM MC 80, PIP 75 - stitches in place 1 wk out from surgery    Time 6    Period Weeks    Status New    Target Date 03/25/22      OT LONG TERM GOAL #3   Title 3.  Patient's left grip strength increased to more than 75% compared to right hand to return to crafts and art activities without increase symptoms    Baseline NO strength yet - 1 wk out from hardware removal - AROM 80 MC , PIP 75- PIP ext -40 - pain 4-5/10    Time 6    Period Weeks    Status New    Target Date 03/25/22                   Plan - 02/18/22 1102     Clinical Impression Statement Patient s/p with left fifth ORIF of proximal phalanges on 11/14/21-patient was seen for therapy for several weeks with limited progress in endrange of flexion and extension limited by hardware.  Patient had hardware removed on 02/04/2022 and referred by orthopedist to OT for 3 times a week for 3 weeks to initiate early passive range of motion-active range of motion to increase flexion and extension at L fifth digit.  Patient was bandaged into composite flexion for the first week.  Patient continues to demonstrate increase passive and active range of motion during session and moist heat to PIP PROM 80-85 and place and hold 80, DIP 55 degrees, MC 90 . Stitches will be removed  this  afternoon.  Patient to keep bandage on as well as buddy strapped per Doctor's order.  Patient can continue to benefit from OT services to decrease scar tissue, decrease edema and increase range of motion and strength to return to previous level of function..    OT Occupational Profile and History Problem Focused Assessment - Including review of records relating to presenting problem    Occupational performance deficits (Please refer to evaluation for details): ADL's;IADL's;Leisure    Body Structure / Function / Physical Skills ADL;Scar mobility;UE functional use;IADL;Pain;Dexterity;FMC;ROM;Edema    Psychosocial Skills Habits;Environmental  Adaptations;Routines and Behaviors    Rehab Potential Good    Clinical Decision Making Limited treatment options, no task modification necessary    Comorbidities Affecting Occupational Performance: None    Modification or Assistance to Complete Evaluation  No modification of tasks or assist necessary to complete eval    OT Frequency 3x / week    OT Duration 6 weeks    OT Treatment/Interventions Self-care/ADL training;Cryotherapy;Paraffin;Therapeutic exercise;Fluidtherapy;Neuromuscular education;Manual Therapy;Scar mobilization;Splinting;Moist Heat;Contrast Bath;Energy conservation;Passive range of motion;Therapeutic activities;Patient/family education    Consulted and Agree with Plan of Care Patient             Patient will benefit from skilled therapeutic intervention in order to improve the following deficits and  impairments:   Body Structure / Function / Physical Skills: ADL, Scar mobility, UE functional use, IADL, Pain, Dexterity, FMC, ROM, Edema   Psychosocial Skills: Habits, Environmental  Adaptations, Routines and Behaviors   Visit Diagnosis: Muscle weakness (generalized)  Stiffness of left hand, not elsewhere classified  Scar tissue  Pain in left hand    Problem List Patient Active Problem List   Diagnosis Date Noted   Vaginal  atrophy 02/07/2016   Vulvitis 02/07/2016   Menopause 02/07/2016   Osteopenia 02/07/2016   HPV test positive 02/07/2016   Essential hypertension 02/07/2016   Diverticulosis 02/07/2016    Rosalyn Gess, OTR/L,CLT 02/18/2022, 11:06 AM  [place] [facility] [address] [place], [address] Phone: [phone]   Fax:  [phone]  Name: Christina Foley MRN: [id number] Date of Birth: 06-25-1946

## 2022-02-20 ENCOUNTER — Ambulatory Visit: Payer: PPO | Admitting: Occupational Therapy

## 2022-02-20 DIAGNOSIS — M79642 Pain in left hand: Secondary | ICD-10-CM

## 2022-02-20 DIAGNOSIS — M6281 Muscle weakness (generalized): Secondary | ICD-10-CM | POA: Diagnosis not present

## 2022-02-20 DIAGNOSIS — L905 Scar conditions and fibrosis of skin: Secondary | ICD-10-CM

## 2022-02-20 DIAGNOSIS — M25642 Stiffness of left hand, not elsewhere classified: Secondary | ICD-10-CM

## 2022-02-20 DIAGNOSIS — M5416 Radiculopathy, lumbar region: Secondary | ICD-10-CM | POA: Diagnosis not present

## 2022-02-20 NOTE — Therapy (Signed)
[place] [facility] [address], [place], [address] Phone: [phone]   Fax:  [phone]  Occupational Therapy Treatment  Patient Details  Name: Christina Foley MRN: [id number] Date of Birth: 03/19/1946 No data recorded  Encounter Date: 02/20/2022   OT End of Session - 02/20/22 0921     Visit Number 15    Number of Visits 25    Date for OT Re-Evaluation 03/25/22    OT Start Time 0905    OT Stop Time 0938    OT Time Calculation (min) 33 min    Activity Tolerance Patient tolerated treatment well    Behavior During Therapy [facility] for tasks assessed/performed             Past Medical History:  Diagnosis Date   Back pain    Cataract cortical, senile, left    DDD (degenerative disc disease), cervical    Decreased libido    Diverticulitis    Diverticulosis    Environmental allergies    seasonal   GERD (gastroesophageal reflux disease)    Heart murmur    1970s   HPV test positive    Hyperlipidemia    Hypertension    Menopausal and postmenopausal disorder    Menopause    Osteoarthritis    Osteopenia    Osteoporosis    Pneumonia    Postmenopausal atrophic vaginitis    Rectocele    Shoulder pain, right    rotator cuff tear   Thyroid nodule     Past Surgical History:  Procedure Laterality Date   BREAST CYST ASPIRATION Bilateral 2000   benign   BUNIONECTOMY Left    CATARACT EXTRACTION Left    COLONOSCOPY     1993, 2005, 2009   COLONOSCOPY WITH PROPOFOL N/A 03/31/2018   Procedure: COLONOSCOPY WITH PROPOFOL;  Surgeon: Manya Silvas, MD;  Location: [facility] ENDOSCOPY;  Service: Endoscopy;  Laterality: N/A;   CRYOTHERAPY     ESOPHAGOGASTRODUODENOSCOPY  1993   ESOPHAGOGASTRODUODENOSCOPY  05/24/2020   ESOPHAGOGASTRODUODENOSCOPY (EGD) WITH PROPOFOL N/A 03/31/2018   Procedure: ESOPHAGOGASTRODUODENOSCOPY (EGD) WITH PROPOFOL;  Surgeon: Manya Silvas, MD;  Location: [facility] ENDOSCOPY;  Service:  Endoscopy;  Laterality: N/A;   HARDWARE REMOVAL Left 02/04/2022   Procedure: Left little finger hardware removal;  Surgeon: Hessie Knows, MD;  Location: [facility] ORS;  Service: Orthopedics;  Laterality: Left;   OPEN REDUCTION INTERNAL FIXATION (ORIF) PROXIMAL PHALANX Left 11/14/2021   Procedure: Left, little finger proximal phalanx ORIF;  Surgeon: Hessie Knows, MD;  Location: [facility] ORS;  Service: Orthopedics;  Laterality: Left;  left little finger   SHOULDER ARTHROSCOPY Right 10/28/2017   Procedure: ARTHROSCOPY SHOULDER EITH DEBRIDEMENT DECOMPRESSIOIN AND REAPIR OF ROTATOR CUFF REPAIR;  Surgeon: Corky Mull, MD;  Location: [place];  Service: Orthopedics;  Laterality: Right;   TOTAL HIP ARTHROPLASTY Right 08/07/2014   TUBAL LIGATION      There were no vitals filed for this visit.   Subjective Assessment - 02/20/22 0913     Subjective  Had my stitches removed and sterri strips on - can I start scar massage - done 3 x exercises last time    Pertinent History Christina Foley is a 76 y.o. female here today status post left little finger ORIF by Dr. Hessie Knows on 11/14/2021. Patient is doing well. Pain well controlled. Still having some limitation with flexion and slight limitation with extension - had hand therapy but needed hard ware removal - seen Dr  Rudene Christians 02/07/22 for follow-up evaluation status post left small finger hardware removal performed on 02/04/2022. She is here for initial evaluation after removal. She was splinted in flexion because she was having trouble flexion. Full flexion could be obtained. She comes back now for splint removal and starting range of motion. refer to OT    Patient Stated Goals Want my motion and strength back in L hand to return to my prior activities and travelling    Currently in Pain? Yes    Pain Score 3     Pain Location Finger (Comment which one)    Pain Orientation Left    Pain Descriptors / Indicators Aching;Tightness;Tender    Pain Type  Surgical pain    Pain Onset More than a month ago    Pain Frequency Intermittent                          OT Treatments/Exercises (OP) - 02/20/22 0001       Moist Heat Therapy   Number Minutes Moist Heat 8 Minutes    Moist Heat Location Hand   2 minutes of moist heat with range of motion then intrinsic a fist to minutes then range of motion in flexion Coban wrap composite flexion of fifth     LUE Paraffin   Number Minutes Paraffin --    LUE Paraffin Location --    Comments --               Pt seen this date for moist heat for 8 mins  and stretching , PROM done on and off during heat Done flexion wrap coban for composite fist too last 2 min  prior  and during  heat PROM,/AROM   then performing PROM with alternating periods of heat between sets.  Pt seen for PROM for flexion, extension for MCP, PIP and DIP, multiple rounds completed, blocking at MCP.   INtrinsic passive range of motion for PIP flexion and then composite, to perform 3-5 times a day.   Lumbrical flexion with PIP extension.  No increased edema this date and pain decreased to 2/10 after PROM an stretches Stitches was removed 2 days ago Steri-Strips in place Patient  tenderness decreased to 5 /10 over A1 pulley patient to do ice massage and make sure she does passive range of motion prior to any active range of motion Patient is blocking MC flexion at 90 degrees during passive range of motion active range of motion for PIP Was able to get flexion of passive range of motion PIP 95 and able to touch palm passively Continue to doing great with edema control-decrease inflammation this time       OT Education - 02/20/22 0921     Education Details exercises, ROM    Person(s) Educated Patient    Methods Explanation;Demonstration;Verbal cues;Handout    Comprehension Verbalized understanding;Returned demonstration;Verbal cues required              OT Short Term Goals - 02/11/22 1218       OT  SHORT TERM GOAL #1   Title 1. Patient to be independent in home program to decrease edema and decrease scar tissue to increase motion in left fifth digit    Baseline Stitches in place, edema - has coban on  -AROM 80MC, PIP 75 and ext -40 PIP    Time 3    Period Weeks    Status New    Target Date 03/04/22  OT Long Term Goals - 02/11/22 1220       OT LONG TERM GOAL #1   Title Patient left fifth digit flexion active range of motion increase for patient to touch palm to drive and squeeze washcloth    Baseline MC80, PIP 75, AROM    Time 6    Period Weeks    Status New    Target Date 03/25/22      OT LONG TERM GOAL #2   Title 2.  Patient able to make composite fist with left fifth digit to squeeze washcloth with no increase symptoms    Baseline pain 4/10 -5/10 - AROM MC 80, PIP 75 - stitches in place 1 wk out from surgery    Time 6    Period Weeks    Status New    Target Date 03/25/22      OT LONG TERM GOAL #3   Title 3.  Patient's left grip strength increased to more than 75% compared to right hand to return to crafts and art activities without increase symptoms    Baseline NO strength yet - 1 wk out from hardware removal - AROM 80 MC , PIP 75- PIP ext -40 - pain 4-5/10    Time 6    Period Weeks    Status New    Target Date 03/25/22                   Plan - 02/20/22 5573     Clinical Impression Statement Patient s/p with left fifth ORIF of proximal phalanges on 11/14/21-patient was seen for therapy for several weeks with limited progress in endrange of flexion and extension limited by hardware.  Patient had hardware removed on 02/04/2022 and referred by orthopedist to OT for 3 times a week for 3 weeks to initiate early passive range of motion-active range of motion to increase flexion and extension at L fifth digit.  Patient was bandaged into composite flexion for the first week after surgery.  Patient continues to demonstrate increase passive and active  range of motion during sessions this past 2 weeks.  Was able to get active range of motion 75-80.  Was able to get today 90-95 degrees of passive range of motion at PIP.  With PIP extension at about -30 degrees.  Patient's stitches was removed 2 days ago Steri-Strips in place.  Patient to continue with buddy strap per Doctor's order.  Patient can continue to benefit from OT services to decrease scar tissue, decrease edema and increase range of motion and strength to return to previous level of function..    OT Occupational Profile and History Problem Focused Assessment - Including review of records relating to presenting problem    Occupational performance deficits (Please refer to evaluation for details): ADL's;IADL's;Leisure    Body Structure / Function / Physical Skills ADL;Scar mobility;UE functional use;IADL;Pain;Dexterity;FMC;ROM;Edema    Psychosocial Skills Habits;Environmental  Adaptations;Routines and Behaviors    Rehab Potential Good    Clinical Decision Making Limited treatment options, no task modification necessary    Comorbidities Affecting Occupational Performance: None    Modification or Assistance to Complete Evaluation  No modification of tasks or assist necessary to complete eval    OT Frequency 3x / week    OT Duration 6 weeks    OT Treatment/Interventions Self-care/ADL training;Cryotherapy;Paraffin;Therapeutic exercise;Fluidtherapy;Neuromuscular education;Manual Therapy;Scar mobilization;Splinting;Moist Heat;Contrast Bath;Energy conservation;Passive range of motion;Therapeutic activities;Patient/family education    Consulted and Agree with Plan of Care Patient  Patient will benefit from skilled therapeutic intervention in order to improve the following deficits and impairments:   Body Structure / Function / Physical Skills: ADL, Scar mobility, UE functional use, IADL, Pain, Dexterity, FMC, ROM, Edema   Psychosocial Skills: Habits, Environmental  Adaptations,  Routines and Behaviors   Visit Diagnosis: Muscle weakness (generalized)  Stiffness of left hand, not elsewhere classified  Scar tissue  Pain in left hand    Problem List Patient Active Problem List   Diagnosis Date Noted   Vaginal atrophy 02/07/2016   Vulvitis 02/07/2016   Menopause 02/07/2016   Osteopenia 02/07/2016   HPV test positive 02/07/2016   Essential hypertension 02/07/2016   Diverticulosis 02/07/2016    Rosalyn Gess, OTR/L,CLT 02/20/2022, 1:33 PM  [place] [facility] [address], [place], [address] Phone: [phone]   Fax:  [phone]  Name: TATISHA CERINO MRN: [id number] Date of Birth: 12/08/1945

## 2022-02-21 ENCOUNTER — Ambulatory Visit: Payer: PPO | Admitting: Occupational Therapy

## 2022-02-21 DIAGNOSIS — L905 Scar conditions and fibrosis of skin: Secondary | ICD-10-CM

## 2022-02-21 DIAGNOSIS — M6281 Muscle weakness (generalized): Secondary | ICD-10-CM | POA: Diagnosis not present

## 2022-02-21 DIAGNOSIS — M25642 Stiffness of left hand, not elsewhere classified: Secondary | ICD-10-CM

## 2022-02-21 DIAGNOSIS — M79642 Pain in left hand: Secondary | ICD-10-CM

## 2022-02-21 NOTE — Therapy (Signed)
[place] [facility] [address], [place], [address] Phone: [phone]   Fax:  [phone]  Occupational Therapy Treatment  Patient Details  Name: Christina Foley MRN: [id number] Date of Birth: 1946-05-25 No data recorded  Encounter Date: 02/21/2022   OT End of Session - 02/21/22 1244     Visit Number 16    Number of Visits 25    Date for OT Re-Evaluation 03/25/22    OT Start Time 1005    OT Stop Time 1031    OT Time Calculation (min) 26 min    Activity Tolerance Patient tolerated treatment well    Behavior During Therapy [facility] for tasks assessed/performed             Past Medical History:  Diagnosis Date   Back pain    Cataract cortical, senile, left    DDD (degenerative disc disease), cervical    Decreased libido    Diverticulitis    Diverticulosis    Environmental allergies    seasonal   GERD (gastroesophageal reflux disease)    Heart murmur    1970s   HPV test positive    Hyperlipidemia    Hypertension    Menopausal and postmenopausal disorder    Menopause    Osteoarthritis    Osteopenia    Osteoporosis    Pneumonia    Postmenopausal atrophic vaginitis    Rectocele    Shoulder pain, right    rotator cuff tear   Thyroid nodule     Past Surgical History:  Procedure Laterality Date   BREAST CYST ASPIRATION Bilateral 2000   benign   BUNIONECTOMY Left    CATARACT EXTRACTION Left    COLONOSCOPY     1993, 2005, 2009   COLONOSCOPY WITH PROPOFOL N/A 03/31/2018   Procedure: COLONOSCOPY WITH PROPOFOL;  Surgeon: Manya Silvas, MD;  Location: [facility] ENDOSCOPY;  Service: Endoscopy;  Laterality: N/A;   CRYOTHERAPY     ESOPHAGOGASTRODUODENOSCOPY  1993   ESOPHAGOGASTRODUODENOSCOPY  05/24/2020   ESOPHAGOGASTRODUODENOSCOPY (EGD) WITH PROPOFOL N/A 03/31/2018   Procedure: ESOPHAGOGASTRODUODENOSCOPY (EGD) WITH PROPOFOL;  Surgeon: Manya Silvas, MD;  Location: [facility] ENDOSCOPY;  Service:  Endoscopy;  Laterality: N/A;   HARDWARE REMOVAL Left 02/04/2022   Procedure: Left little finger hardware removal;  Surgeon: Hessie Knows, MD;  Location: [facility] ORS;  Service: Orthopedics;  Laterality: Left;   OPEN REDUCTION INTERNAL FIXATION (ORIF) PROXIMAL PHALANX Left 11/14/2021   Procedure: Left, little finger proximal phalanx ORIF;  Surgeon: Hessie Knows, MD;  Location: [facility] ORS;  Service: Orthopedics;  Laterality: Left;  left little finger   SHOULDER ARTHROSCOPY Right 10/28/2017   Procedure: ARTHROSCOPY SHOULDER EITH DEBRIDEMENT DECOMPRESSIOIN AND REAPIR OF ROTATOR CUFF REPAIR;  Surgeon: Corky Mull, MD;  Location: [place];  Service: Orthopedics;  Laterality: Right;   TOTAL HIP ARTHROPLASTY Right 08/07/2014   TUBAL LIGATION      There were no vitals filed for this visit.   Subjective Assessment - 02/21/22 1243     Subjective  The Steri-Strips still on again to keep it on for another day or 2.  But I am doing scar massage over it.  Is a little sore since I had my session yesterday but not painful.    Pertinent History Christina Foley is a 76 y.o. female here today status post left little finger ORIF by Dr. Hessie Knows on 11/14/2021. Patient is doing well. Pain well controlled. Still having some limitation with flexion and  slight limitation with extension - had hand therapy but needed hard ware removal - seen Dr Rudene Christians 02/07/22 for follow-up evaluation status post left small finger hardware removal performed on 02/04/2022. She is here for initial evaluation after removal. She was splinted in flexion because she was having trouble flexion. Full flexion could be obtained. She comes back now for splint removal and starting range of motion. refer to OT    Patient Stated Goals Want my motion and strength back in L hand to return to my prior activities and travelling    Currently in Pain? No/denies                [facility] OT Assessment - 02/21/22 0001       Left Hand AROM   L  Little  MCP 0-90 90 Degrees    L Little PIP 0-100 80 Degrees   90 PROM, 0ext   L Little DIP 0-70 55 Degrees                      OT Treatments/Exercises (OP) - 02/21/22 0001       Moist Heat Therapy   Number Minutes Moist Heat 8 Minutes    Moist Heat Location Hand   Hand in extension and then and composite flexion to 3 times in between exercises.             Pt seen this date for moist heat for 8 mins  and stretching , PROM done on and off during heat Generally he twisted extension, then did intrinsic stretch, and then did composite fist.  With performing PROM flexion and extension of PIP with alternating periods of heat between sets.   Pt seen for PROM for flexion, extension for MCP, PIP and DIP, multiple rounds completed, blocking at MCP.   INtrinsic passive range of motion for PIP flexion and then composite, to perform 3-5 times a day.   Lumbrical flexion with PIP extension.  Patient still doing great with swelling.  Did do some ice for a minute at the end of session.  Done great tolerating passive range of motion from full extension to 90 degrees flexion.   Patient's pain about a 4/10 today because of session yesterday.    Steri-Strips still in place patient can do scar massage over Steri-Strips. Patient  tenderness decreased to 3 /10 over A1 pulley patient to do ice massage and make sure she does passive range of motion prior to any active range of motion Patient is blocking MC flexion at 90 degrees during passive range of motion active range of motion for PIP Was able to get flexion of passive range of motion PIP 90 and able to touch palm passively Continue to doing great with edema control-decrease inflammation this time        OT Education - 02/21/22 1244     Education Details exercises, ROM    Person(s) Educated Patient    Methods Explanation;Demonstration;Verbal cues;Handout    Comprehension Verbalized understanding;Returned demonstration;Verbal cues  required              OT Short Term Goals - 02/11/22 1218       OT SHORT TERM GOAL #1   Title 1. Patient to be independent in home program to decrease edema and decrease scar tissue to increase motion in left fifth digit    Baseline Stitches in place, edema - has coban on  -AROM 80MC, PIP 75 and ext -40 PIP    Time 3  Period Weeks    Status New    Target Date 03/04/22               OT Long Term Goals - 02/11/22 1220       OT LONG TERM GOAL #1   Title Patient left fifth digit flexion active range of motion increase for patient to touch palm to drive and squeeze washcloth    Baseline MC80, PIP 75, AROM    Time 6    Period Weeks    Status New    Target Date 03/25/22      OT LONG TERM GOAL #2   Title 2.  Patient able to make composite fist with left fifth digit to squeeze washcloth with no increase symptoms    Baseline pain 4/10 -5/10 - AROM MC 80, PIP 75 - stitches in place 1 wk out from surgery    Time 6    Period Weeks    Status New    Target Date 03/25/22      OT LONG TERM GOAL #3   Title 3.  Patient's left grip strength increased to more than 75% compared to right hand to return to crafts and art activities without increase symptoms    Baseline NO strength yet - 1 wk out from hardware removal - AROM 80 MC , PIP 75- PIP ext -40 - pain 4-5/10    Time 6    Period Weeks    Status New    Target Date 03/25/22                   Plan - 02/21/22 1248     Clinical Impression Statement Patient s/p with left fifth ORIF of proximal phalanges on 11/14/21-patient was seen for therapy for several weeks with limited progress in endrange of flexion and extension limited by hardware.  Patient had hardware removed on 02/04/2022 and referred by orthopedist to OT for 3 times a week for 3 weeks to initiate early passive range of motion-active range of motion to increase flexion and extension at L fifth digit.  Patient was bandaged into composite flexion for the first week  after surgery.  Patient continues to demonstrate increase passive and active range of motion during sessions this past 2 weeks.  Was able to get active range of motion 75-80.  Was able to get today 90 degrees of passive range of motion at PIP to PROM PIP ext 0 degrees.  With PIP extension  AROM at about -30 degrees.  Patient's stitches was removed  earlier this week - Steri-Strips in place.  Patient can start doing some scar massage since last time over the Steri-Strips.  Patient to continue with buddy strap per Doctor's order.  Patient can continue to benefit from OT services to decrease scar tissue, decrease edema and increase range of motion and strength to return to previous level of function..    OT Occupational Profile and History Problem Focused Assessment - Including review of records relating to presenting problem    Occupational performance deficits (Please refer to evaluation for details): ADL's;IADL's;Leisure    Body Structure / Function / Physical Skills ADL;Scar mobility;UE functional use;IADL;Pain;Dexterity;FMC;ROM;Edema    Psychosocial Skills Habits;Environmental  Adaptations;Routines and Behaviors    Rehab Potential Good    Clinical Decision Making Limited treatment options, no task modification necessary    Comorbidities Affecting Occupational Performance: None    Modification or Assistance to Complete Evaluation  No modification of tasks or assist necessary to complete eval  OT Frequency 3x / week    OT Duration 6 weeks    OT Treatment/Interventions Self-care/ADL training;Cryotherapy;Paraffin;Therapeutic exercise;Fluidtherapy;Neuromuscular education;Manual Therapy;Scar mobilization;Splinting;Moist Heat;Contrast Bath;Energy conservation;Passive range of motion;Therapeutic activities;Patient/family education    Consulted and Agree with Plan of Care Patient             Patient will benefit from skilled therapeutic intervention in order to improve the following deficits and  impairments:   Body Structure / Function / Physical Skills: ADL, Scar mobility, UE functional use, IADL, Pain, Dexterity, FMC, ROM, Edema   Psychosocial Skills: Habits, Environmental  Adaptations, Routines and Behaviors   Visit Diagnosis: Muscle weakness (generalized)  Stiffness of left hand, not elsewhere classified  Scar tissue  Pain in left hand    Problem List Patient Active Problem List   Diagnosis Date Noted   Vaginal atrophy 02/07/2016   Vulvitis 02/07/2016   Menopause 02/07/2016   Osteopenia 02/07/2016   HPV test positive 02/07/2016   Essential hypertension 02/07/2016   Diverticulosis 02/07/2016    Christina Foley, OTR/L,CLT 02/21/2022, 12:50 PM  [place] [facility] [address], [place], [address] Phone: [phone]   Fax:  [phone]  Name: KAELI NICHELSON MRN: [id number] Date of Birth: Feb 12, 1946

## 2022-02-25 ENCOUNTER — Ambulatory Visit: Payer: PPO | Admitting: Occupational Therapy

## 2022-02-25 DIAGNOSIS — M6281 Muscle weakness (generalized): Secondary | ICD-10-CM

## 2022-02-25 DIAGNOSIS — M25642 Stiffness of left hand, not elsewhere classified: Secondary | ICD-10-CM

## 2022-02-25 DIAGNOSIS — M79642 Pain in left hand: Secondary | ICD-10-CM

## 2022-02-25 DIAGNOSIS — M5416 Radiculopathy, lumbar region: Secondary | ICD-10-CM | POA: Diagnosis not present

## 2022-02-25 DIAGNOSIS — M79671 Pain in right foot: Secondary | ICD-10-CM | POA: Diagnosis not present

## 2022-02-25 DIAGNOSIS — L905 Scar conditions and fibrosis of skin: Secondary | ICD-10-CM

## 2022-02-25 DIAGNOSIS — M65871 Other synovitis and tenosynovitis, right ankle and foot: Secondary | ICD-10-CM | POA: Diagnosis not present

## 2022-02-25 DIAGNOSIS — M19071 Primary osteoarthritis, right ankle and foot: Secondary | ICD-10-CM | POA: Diagnosis not present

## 2022-02-25 NOTE — Therapy (Signed)
[place] [facility] [address] [place], [place], [address] Phone: [phone]   Fax:  [phone]  Occupational Therapy Treatment  Patient Details  Name: Christina Foley MRN: [id number] Date of Birth: 10/04/1945 No data recorded  Encounter Date: 02/25/2022   OT End of Session - 02/25/22 1620     Visit Number 17    Number of Visits 25    Date for OT Re-Evaluation 03/25/22    OT Start Time 1530    OT Stop Time 1611    OT Time Calculation (min) 41 min    Activity Tolerance Patient tolerated treatment well    Behavior During Therapy [facility] for tasks assessed/performed             Past Medical History:  Diagnosis Date   Back pain    Cataract cortical, senile, left    DDD (degenerative disc disease), cervical    Decreased libido    Diverticulitis    Diverticulosis    Environmental allergies    seasonal   GERD (gastroesophageal reflux disease)    Heart murmur    1970s   HPV test positive    Hyperlipidemia    Hypertension    Menopausal and postmenopausal disorder    Menopause    Osteoarthritis    Osteopenia    Osteoporosis    Pneumonia    Postmenopausal atrophic vaginitis    Rectocele    Shoulder pain, right    rotator cuff tear   Thyroid nodule     Past Surgical History:  Procedure Laterality Date   BREAST CYST ASPIRATION Bilateral 2000   benign   BUNIONECTOMY Left    CATARACT EXTRACTION Left    COLONOSCOPY     1993, 2005, 2009   COLONOSCOPY WITH PROPOFOL N/A 03/31/2018   Procedure: COLONOSCOPY WITH PROPOFOL;  Surgeon: Manya Silvas, MD;  Location: [facility] ENDOSCOPY;  Service: Endoscopy;  Laterality: N/A;   CRYOTHERAPY     ESOPHAGOGASTRODUODENOSCOPY  1993   ESOPHAGOGASTRODUODENOSCOPY  05/24/2020   ESOPHAGOGASTRODUODENOSCOPY (EGD) WITH PROPOFOL N/A 03/31/2018   Procedure: ESOPHAGOGASTRODUODENOSCOPY (EGD) WITH PROPOFOL;  Surgeon: Manya Silvas, MD;  Location: [facility] ENDOSCOPY;  Service:  Endoscopy;  Laterality: N/A;   HARDWARE REMOVAL Left 02/04/2022   Procedure: Left little finger hardware removal;  Surgeon: Hessie Knows, MD;  Location: [facility] ORS;  Service: Orthopedics;  Laterality: Left;   OPEN REDUCTION INTERNAL FIXATION (ORIF) PROXIMAL PHALANX Left 11/14/2021   Procedure: Left, little finger proximal phalanx ORIF;  Surgeon: Hessie Knows, MD;  Location: [facility] ORS;  Service: Orthopedics;  Laterality: Left;  left little finger   SHOULDER ARTHROSCOPY Right 10/28/2017   Procedure: ARTHROSCOPY SHOULDER EITH DEBRIDEMENT DECOMPRESSIOIN AND REAPIR OF ROTATOR CUFF REPAIR;  Surgeon: Corky Mull, MD;  Location: [place];  Service: Orthopedics;  Laterality: Right;   TOTAL HIP ARTHROPLASTY Right 08/07/2014   TUBAL LIGATION      There were no vitals filed for this visit.   Subjective Assessment - 02/25/22 1619     Subjective  The Steri-Strips came off so I started doing scar massage but it was really stiff this weekend when work up in [employer].    Pertinent History Christina Foley is a 76 y.o. female here today status post left little finger ORIF by Dr. Hessie Knows on 11/14/2021. Patient is doing well. Pain well controlled. Still having some limitation with flexion and slight limitation with extension - had hand therapy but needed hard ware removal -  seen Dr Rudene Christians 02/07/22 for follow-up evaluation status post left small finger hardware removal performed on 02/04/2022. She is here for initial evaluation after removal. She was splinted in flexion because she was having trouble flexion. Full flexion could be obtained. She comes back now for splint removal and starting range of motion. refer to OT    Patient Stated Goals Want my motion and strength back in L hand to return to my prior activities and travelling    Currently in Pain? No/denies                          OT Treatments/Exercises (OP) - 02/25/22 0001       Moist Heat Therapy   Number Minutes  Moist Heat 8 Minutes    Moist Heat Location Hand   Several times with Coban wrap in between extension and flexion of fifth            Pt seen this date for moist heat for 8 mins  and stretching , PROM done on and off during heat Wrap into flexion, then intrinsic stretch, and then did composite fist.  With performing PROM flexion and extension of PIP with alternating periods of heat between sets.    Pt seen for PROM for flexion, extension for MCP, PIP and DIP, multiple rounds completed, blocking at MCP.   INtrinsic passive range of motion for PIP flexion and then composite, to perform 3-5 times a day.   Lumbrical flexion with PIP extension.  Patient still doing great with swelling.  Did do some ice for a minute at the end of session.  Done great tolerating passive range of motion from full extension to 90 degrees flexion.       1 Steri-Strip still in place patient can do scar massage over Steri-Strips. Done mini massager and xtractor Patient  tenderness decreased to 3 /10 over A1 pulley patient to do ice massage and make sure she does passive range of motion prior to any active range of motion Patient is blocking MC flexion at 90 degrees during passive range of motion active range of motion for PIP Was able to get flexion of passive range of motion PIP 95 and able to touch palm passively this date  Follow by place and hold- but decrease extention  Done graston tool nr 2 brushing and sweeping over volar 5th and palm prior to extention PROM at PIP to 0 Continue to doing great with edema control-decrease inflammation this time         OT Education - 02/25/22 1620     Education Details exercises, ROM    Person(s) Educated Patient    Methods Explanation;Demonstration;Verbal cues;Handout    Comprehension Verbalized understanding;Returned demonstration;Verbal cues required              OT Short Term Goals - 02/11/22 1218       OT SHORT TERM GOAL #1   Title 1. Patient to be  independent in home program to decrease edema and decrease scar tissue to increase motion in left fifth digit    Baseline Stitches in place, edema - has coban on  -AROM 80MC, PIP 75 and ext -40 PIP    Time 3    Period Weeks    Status New    Target Date 03/04/22               OT Long Term Goals - 02/11/22 1220       OT  LONG TERM GOAL #1   Title Patient left fifth digit flexion active range of motion increase for patient to touch palm to drive and squeeze washcloth    Baseline MC80, PIP 75, AROM    Time 6    Period Weeks    Status New    Target Date 03/25/22      OT LONG TERM GOAL #2   Title 2.  Patient able to make composite fist with left fifth digit to squeeze washcloth with no increase symptoms    Baseline pain 4/10 -5/10 - AROM MC 80, PIP 75 - stitches in place 1 wk out from surgery    Time 6    Period Weeks    Status New    Target Date 03/25/22      OT LONG TERM GOAL #3   Title 3.  Patient's left grip strength increased to more than 75% compared to right hand to return to crafts and art activities without increase symptoms    Baseline NO strength yet - 1 wk out from hardware removal - AROM 80 MC , PIP 75- PIP ext -40 - pain 4-5/10    Time 6    Period Weeks    Status New    Target Date 03/25/22                   Plan - 02/25/22 1621     Clinical Impression Statement Patient s/p with left fifth ORIF of proximal phalanges on 11/14/21-patient was seen for therapy for several weeks with limited progress in endrange of flexion and extension limited by hardware.  Patient had hardware removed on 02/04/2022 and referred by orthopedist to OT for 3 times a week for 3 weeks to initiate early passive range of motion-active range of motion to increase flexion and extension at L fifth digit.  Patient was bandaged into composite flexion for the first week after surgery.  Patient continues to demonstrate increase passive and active range of motion during sessions this past 2-3  weeks.  Was able to get active range of motion 80.  Was able to get today 90-95 degrees of passive range of motion at PIP to PROM PIP ext 0 degrees.  With PIP extension  AROM at about -30 degrees, was little more stiff today for extention.  Patient's stitches was removed  earlier last  week - 1 Steri-Strips still in place.  Focusing on scar massage.  Patient to continue with buddy strap per Doctor's order.  Patient can continue to benefit from OT services to decrease scar tissue, decrease edema and increase range of motion and strength to return to previous level of function..    OT Occupational Profile and History Problem Focused Assessment - Including review of records relating to presenting problem    Occupational performance deficits (Please refer to evaluation for details): ADL's;IADL's;Leisure    Body Structure / Function / Physical Skills ADL;Scar mobility;UE functional use;IADL;Pain;Dexterity;FMC;ROM;Edema    Psychosocial Skills Habits;Environmental  Adaptations;Routines and Behaviors    Rehab Potential Good    Clinical Decision Making Limited treatment options, no task modification necessary    Comorbidities Affecting Occupational Performance: None    Modification or Assistance to Complete Evaluation  No modification of tasks or assist necessary to complete eval    OT Frequency 3x / week    OT Duration 6 weeks    OT Treatment/Interventions Self-care/ADL training;Cryotherapy;Paraffin;Therapeutic exercise;Fluidtherapy;Neuromuscular education;Manual Therapy;Scar mobilization;Splinting;Moist Heat;Contrast Bath;Energy conservation;Passive range of motion;Therapeutic activities;Patient/family education    Consulted and Agree  with Plan of Care Patient             Patient will benefit from skilled therapeutic intervention in order to improve the following deficits and impairments:   Body Structure / Function / Physical Skills: ADL, Scar mobility, UE functional use, IADL, Pain, Dexterity, FMC,  ROM, Edema   Psychosocial Skills: Habits, Environmental  Adaptations, Routines and Behaviors   Visit Diagnosis: Muscle weakness (generalized)  Stiffness of left hand, not elsewhere classified  Scar tissue  Pain in left hand    Problem List Patient Active Problem List   Diagnosis Date Noted   Vaginal atrophy 02/07/2016   Vulvitis 02/07/2016   Menopause 02/07/2016   Osteopenia 02/07/2016   HPV test positive 02/07/2016   Essential hypertension 02/07/2016   Diverticulosis 02/07/2016    Rosalyn Gess, OTR/l,CLT 02/25/2022, 4:24 PM  [place] [facility] [address], [place], [address] Phone: [phone]   Fax:  [phone]  Name: Christina Foley MRN: [id number] Date of Birth: July 30, 1946

## 2022-02-27 ENCOUNTER — Ambulatory Visit: Payer: PPO | Admitting: Occupational Therapy

## 2022-02-27 DIAGNOSIS — L905 Scar conditions and fibrosis of skin: Secondary | ICD-10-CM

## 2022-02-27 DIAGNOSIS — M6281 Muscle weakness (generalized): Secondary | ICD-10-CM | POA: Diagnosis not present

## 2022-02-27 DIAGNOSIS — M79642 Pain in left hand: Secondary | ICD-10-CM

## 2022-02-27 DIAGNOSIS — M25642 Stiffness of left hand, not elsewhere classified: Secondary | ICD-10-CM

## 2022-02-27 DIAGNOSIS — M5416 Radiculopathy, lumbar region: Secondary | ICD-10-CM | POA: Diagnosis not present

## 2022-02-27 NOTE — Therapy (Signed)
[facility] [address], [place], [address] Phone: [phone]   Fax:  [phone]  Occupational Therapy Treatment  Patient Details  Name: Christina Foley MRN: [id number] Date of Birth: [birth year] No data recorded  Encounter Date: 02/27/2022   OT End of Session - 02/27/22 1452     Visit Number 18    Number of Visits 25    Date for OT Re-Evaluation 03/25/22    OT Start Time 1315    OT Stop Time 1355    OT Time Calculation (min) 40 min    Activity Tolerance Patient tolerated treatment well    Behavior During Therapy [facility] for tasks assessed/performed             Past Medical History:  Diagnosis Date   Back pain    Cataract cortical, senile, left    DDD (degenerative disc disease), cervical    Decreased libido    Diverticulitis    Diverticulosis    Environmental allergies    seasonal   GERD (gastroesophageal reflux disease)    Heart murmur    1970s   HPV test positive    Hyperlipidemia    Hypertension    Menopausal and postmenopausal disorder    Menopause    Osteoarthritis    Osteopenia    Osteoporosis    Pneumonia    Postmenopausal atrophic vaginitis    Rectocele    Shoulder pain, right    rotator cuff tear   Thyroid nodule     Past Surgical History:  Procedure Laterality Date   BREAST CYST ASPIRATION Bilateral 2000   benign   BUNIONECTOMY Left    CATARACT EXTRACTION Left    COLONOSCOPY     1993, 2005, 2009   COLONOSCOPY WITH PROPOFOL N/A 03/31/2018   Procedure: COLONOSCOPY WITH PROPOFOL;  Surgeon: Manya Silvas, MD;  Location: [facility] ENDOSCOPY;  Service: Endoscopy;  Laterality: N/A;   CRYOTHERAPY     ESOPHAGOGASTRODUODENOSCOPY  1993   ESOPHAGOGASTRODUODENOSCOPY  05/24/2020   ESOPHAGOGASTRODUODENOSCOPY (EGD) WITH PROPOFOL N/A 03/31/2018   Procedure: ESOPHAGOGASTRODUODENOSCOPY (EGD) WITH PROPOFOL;  Surgeon: Manya Silvas, MD;  Location: [facility] ENDOSCOPY;  Service:  Endoscopy;  Laterality: N/A;   HARDWARE REMOVAL Left 02/04/2022   Procedure: Left little finger hardware removal;  Surgeon: Hessie Knows, MD;  Location: [facility] ORS;  Service: Orthopedics;  Laterality: Left;   OPEN REDUCTION INTERNAL FIXATION (ORIF) PROXIMAL PHALANX Left 11/14/2021   Procedure: Left, little finger proximal phalanx ORIF;  Surgeon: Hessie Knows, MD;  Location: [facility] ORS;  Service: Orthopedics;  Laterality: Left;  left little finger   SHOULDER ARTHROSCOPY Right 10/28/2017   Procedure: ARTHROSCOPY SHOULDER EITH DEBRIDEMENT DECOMPRESSIOIN AND REAPIR OF ROTATOR CUFF REPAIR;  Surgeon: Corky Mull, MD;  Location: [facility];  Service: Orthopedics;  Laterality: Right;   TOTAL HIP ARTHROPLASTY Right 08/07/2014   TUBAL LIGATION      There were no vitals filed for this visit.   Subjective Assessment - 02/27/22 1319     Subjective  Last Steri-Strip came off -can I start using the silicone sleeves.  Keeping the buddy strap still on.  Little bit more sore may be swelling but not back    Pertinent History Christina Foley is a 76 y.o. female here today status post left little finger ORIF by Dr. Hessie Knows on 11/14/2021. Patient is doing well. Pain well controlled. Still having some limitation with flexion and slight limitation with extension - had hand therapy  but needed hard ware removal - seen Dr Rudene Christians 02/07/22 for follow-up evaluation status post left small finger hardware removal performed on 02/04/2022. She is here for initial evaluation after removal. She was splinted in flexion because she was having trouble flexion. Full flexion could be obtained. She comes back now for splint removal and starting range of motion. refer to OT    Patient Stated Goals Want my motion and strength back in L hand to return to my prior activities and travelling    Currently in Pain? Yes    Pain Score 2     Pain Location Finger (Comment which one)    Pain Orientation Left    Pain Descriptors /  Indicators Aching;Tender;Tightness    Pain Type Surgical pain    Pain Onset More than a month ago    Pain Frequency Intermittent                          OT Treatments/Exercises (OP) - 02/27/22 0001       Moist Heat Therapy   Number Minutes Moist Heat 8 Minutes    Moist Heat Location Hand   Several times in extension and flexion flexion extension of PIP exercises            Pt seen this date for moist heat for 8 mins  and stretching , PROM done on and off during heat Wrap into flexion, then intrinsic stretch, and then did composite fist.  With performing PROM flexion and extension of PIP with alternating periods of heat between sets.    Pt seen for PROM for flexion, extension for MCP, PIP and DIP, multiple rounds completed, blocking at MCP.   INtrinsic passive range of motion for PIP flexion and then composite, to perform 3-5 times a day.   Lumbrical flexion with PIP extension.  Patient still doing great with swelling.  Done great tolerating passive range of motion from full extension to 95 degrees flexion.       Last Steri-Strip came off patient to focus on more scar massage. Done mini massager and xtractor today with passive range of motion. Patient  tenderness decreased to 3 /10 over A1 pulley patient to do ice massage and make sure she does passive range of motion prior to any active range of motion Patient is blocking MC flexion at 90 degrees during passive range of motion active range of motion for PIP Was able to get flexion of passive range of motion PIP 95 and able to touch palm passively  Follow by place and hold- but decrease extention  Done graston tool nr 2 brushing and sweeping over volar 5th and palm prior to extention PROM at PIP to 0 Continue to doing great with edema control-decrease inflammation this time         OT Education - 02/27/22 1319     Education Details exercises, ROM    Person(s) Educated Patient    Methods  Explanation;Demonstration;Verbal cues;Handout    Comprehension Verbalized understanding;Returned demonstration;Verbal cues required              OT Short Term Goals - 02/11/22 1218       OT SHORT TERM GOAL #1   Title 1. Patient to be independent in home program to decrease edema and decrease scar tissue to increase motion in left fifth digit    Baseline Stitches in place, edema - has coban on  -AROM 80MC, PIP 75 and ext -40 PIP  Time 3    Period Weeks    Status New    Target Date 03/04/22               OT Long Term Goals - 02/11/22 1220       OT LONG TERM GOAL #1   Title Patient left fifth digit flexion active range of motion increase for patient to touch palm to drive and squeeze washcloth    Baseline MC80, PIP 75, AROM    Time 6    Period Weeks    Status New    Target Date 03/25/22      OT LONG TERM GOAL #2   Title 2.  Patient able to make composite fist with left fifth digit to squeeze washcloth with no increase symptoms    Baseline pain 4/10 -5/10 - AROM MC 80, PIP 75 - stitches in place 1 wk out from surgery    Time 6    Period Weeks    Status New    Target Date 03/25/22      OT LONG TERM GOAL #3   Title 3.  Patient's left grip strength increased to more than 75% compared to right hand to return to crafts and art activities without increase symptoms    Baseline NO strength yet - 1 wk out from hardware removal - AROM 80 MC , PIP 75- PIP ext -40 - pain 4-5/10    Time 6    Period Weeks    Status New    Target Date 03/25/22                   Plan - 02/27/22 1453     Clinical Impression Statement Patient s/p with left fifth ORIF of proximal phalanges on 11/14/21-patient was seen for therapy for several weeks with limited progress in endrange of flexion and extension limited by hardware.  Patient had hardware removed on 02/04/2022 and referred by orthopedist to OT for 3 times a week for 3 weeks to initiate early passive range of motion-active range of  motion to increase flexion and extension at L fifth digit.  Patient was bandaged into composite flexion for the first week after surgery.  Patient continues to demonstrate increase passive and active range of motion during sessions this past  3 weeks.  Was able to get active range of motion 80.  Was able to get 95 degrees of passive range of motion at PIP to PROM PIP ext 0 degrees.  With PIP extension  AROM at about -30 degrees, was little more stiff for  PIP extention the last 2 sessions.  Last Steri-Strip came off doing more scar massage now and patient can start silicone sleeve  and focusing on scar massage.  Patient to continue with buddy strap per Doctor's order.  Patient can continue to benefit from OT services to decrease scar tissue, decrease edema and increase range of motion and strength to return to previous level of function..    OT Occupational Profile and History Problem Focused Assessment - Including review of records relating to presenting problem    Occupational performance deficits (Please refer to evaluation for details): ADL's;IADL's;Leisure    Body Structure / Function / Physical Skills ADL;Scar mobility;UE functional use;IADL;Pain;Dexterity;FMC;ROM;Edema    Rehab Potential Good    Clinical Decision Making Limited treatment options, no task modification necessary    Comorbidities Affecting Occupational Performance: None    Modification or Assistance to Complete Evaluation  No modification of tasks or assist necessary to  complete eval    OT Frequency 3x / week    OT Duration 6 weeks    OT Treatment/Interventions Self-care/ADL training;Cryotherapy;Paraffin;Therapeutic exercise;Fluidtherapy;Neuromuscular education;Manual Therapy;Scar mobilization;Splinting;Moist Heat;Contrast Bath;Energy conservation;Passive range of motion;Therapeutic activities;Patient/family education    Consulted and Agree with Plan of Care Patient             Patient will benefit from skilled therapeutic  intervention in order to improve the following deficits and impairments:   Body Structure / Function / Physical Skills: ADL, Scar mobility, UE functional use, IADL, Pain, Dexterity, FMC, ROM, Edema       Visit Diagnosis: Muscle weakness (generalized)  Stiffness of left hand, not elsewhere classified  Scar tissue  Pain in left hand    Problem List Patient Active Problem List   Diagnosis Date Noted   Vaginal atrophy 02/07/2016   Vulvitis 02/07/2016   Menopause 02/07/2016   Osteopenia 02/07/2016   HPV test positive 02/07/2016   Essential hypertension 02/07/2016   Diverticulosis 02/07/2016    Rosalyn Gess, OTR/L,CLT 02/27/2022, 2:56 PM  [facility] [address] [place], [address] Phone: [phone]   Fax:  [phone]  Name: Christina Foley MRN: [id number] Date of Birth: 1946/01/30

## 2022-02-28 ENCOUNTER — Ambulatory Visit: Payer: PPO | Admitting: Occupational Therapy

## 2022-02-28 DIAGNOSIS — L905 Scar conditions and fibrosis of skin: Secondary | ICD-10-CM

## 2022-02-28 DIAGNOSIS — M79642 Pain in left hand: Secondary | ICD-10-CM

## 2022-02-28 DIAGNOSIS — M25642 Stiffness of left hand, not elsewhere classified: Secondary | ICD-10-CM

## 2022-02-28 DIAGNOSIS — M6281 Muscle weakness (generalized): Secondary | ICD-10-CM | POA: Diagnosis not present

## 2022-03-03 DIAGNOSIS — M5416 Radiculopathy, lumbar region: Secondary | ICD-10-CM | POA: Diagnosis not present

## 2022-03-04 ENCOUNTER — Ambulatory Visit: Payer: PPO | Admitting: Occupational Therapy

## 2022-03-04 DIAGNOSIS — M25642 Stiffness of left hand, not elsewhere classified: Secondary | ICD-10-CM

## 2022-03-04 DIAGNOSIS — M6281 Muscle weakness (generalized): Secondary | ICD-10-CM

## 2022-03-04 DIAGNOSIS — M79642 Pain in left hand: Secondary | ICD-10-CM

## 2022-03-04 DIAGNOSIS — L905 Scar conditions and fibrosis of skin: Secondary | ICD-10-CM

## 2022-03-04 NOTE — Therapy (Signed)
[facility] [address] [place], [place], [address] Phone: [phone]   Fax:  [phone]  Occupational Therapy Treatment  Patient Details  Name: Christina Foley MRN: [id number] Date of Birth: 09/17/1945 No data recorded  Encounter Date: 03/04/2022   OT End of Session - 03/04/22 0816     Visit Number 20    Number of Visits 25    Date for OT Re-Evaluation 03/25/22    OT Start Time 0816    OT Stop Time 0856    OT Time Calculation (min) 40 min    Activity Tolerance Patient tolerated treatment well    Behavior During Therapy [facility] for tasks assessed/performed             Past Medical History:  Diagnosis Date   Back pain    Cataract cortical, senile, left    DDD (degenerative disc disease), cervical    Decreased libido    Diverticulitis    Diverticulosis    Environmental allergies    seasonal   GERD (gastroesophageal reflux disease)    Heart murmur    1970s   HPV test positive    Hyperlipidemia    Hypertension    Menopausal and postmenopausal disorder    Menopause    Osteoarthritis    Osteopenia    Osteoporosis    Pneumonia    Postmenopausal atrophic vaginitis    Rectocele    Shoulder pain, right    rotator cuff tear   Thyroid nodule     Past Surgical History:  Procedure Laterality Date   BREAST CYST ASPIRATION Bilateral 2000   benign   BUNIONECTOMY Left    CATARACT EXTRACTION Left    COLONOSCOPY     1993, 2005, 2009   COLONOSCOPY WITH PROPOFOL N/A 03/31/2018   Procedure: COLONOSCOPY WITH PROPOFOL;  Surgeon: Scot Jun, MD;  Location: [facility] ENDOSCOPY;  Service: Endoscopy;  Laterality: N/A;   CRYOTHERAPY     ESOPHAGOGASTRODUODENOSCOPY  1993   ESOPHAGOGASTRODUODENOSCOPY  05/24/2020   ESOPHAGOGASTRODUODENOSCOPY (EGD) WITH PROPOFOL N/A 03/31/2018   Procedure: ESOPHAGOGASTRODUODENOSCOPY (EGD) WITH PROPOFOL;  Surgeon: Scot Jun, MD;  Location[facility] ENDOSCOPY;  Service:  Endoscopy;  Laterality: N/A;   HARDWARE REMOVAL Left 02/04/2022   Procedure: Left little finger hardware removal;  Surgeon: Kennedy Bucker, MD;  Location: [facility] ORS;  Service: Orthopedics;  Laterality: Left;   OPEN REDUCTION INTERNAL FIXATION (ORIF) PROXIMAL PHALANX Left 11/14/2021   Procedure: Left, little finger proximal phalanx ORIF;  Surgeon: Kennedy Bucker, MD;  Location: [facility] ORS;  Service: Orthopedics;  Laterality: Left;  left little finger   SHOULDER ARTHROSCOPY Right 10/28/2017   Procedure: ARTHROSCOPY SHOULDER EITH DEBRIDEMENT DECOMPRESSIOIN AND REAPIR OF ROTATOR CUFF REPAIR;  Surgeon: Christena Flake, MD;  Location: [facility];  Service: Orthopedics;  Laterality: Right;   TOTAL HIP ARTHROPLASTY Right 08/07/2014   TUBAL LIGATION      There were no vitals filed for this visit.   Subjective Assessment - 03/04/22 0816     Subjective  On losing a little bit of the straightening of my pinky.  I working at scar really hard.  I am not getting into touch my palm like you do.    Pertinent History Christina Foley is a 76 y.o. female here today status post left little finger ORIF by Dr. Kennedy Bucker on 11/14/2021. Patient is doing well. Pain well controlled. Still having some limitation with flexion and slight limitation with extension - had hand  therapy but needed hard ware removal - seen Dr Rosita Kea 02/07/22 for follow-up evaluation status post left small finger hardware removal performed on 02/04/2022. She is here for initial evaluation after removal. She was splinted in flexion because she was having trouble flexion. Full flexion could be obtained. She comes back now for splint removal and starting range of motion. refer to OT    Patient Stated Goals Want my motion and strength back in L hand to return to my prior activities and travelling    Currently in Pain? No/denies                   Able to get full passive range of motion for 90 degrees of PIP flexion coming in this date.   Able to get composite flexion touching palm.  Appear patient is losing some PIP active range of motion extension during the day. Patient with more strength in flexion than extension.         OT Treatments/Exercises (OP) - 03/04/22 0001       LUE Paraffin   Number Minutes Paraffin 8 Minutes    LUE Paraffin Location Hand    Comments LMB splint on for PIP extension               This date initiated paraffin wax with LMB splint for PIP extension.  Prior to soft tissue massage using mini massager and Grasston tool #2 for sweeping and brushing over volar fifth digit. Prior to passive range of motion for PIP extension Able to get full 0 degree PIP extension Initiated rubber band for PIP extension blocking MC at 0 degrees.  Change to place and hold with MC flexion blocked with 0-placed on hold PIP extension 5 repetitions 4 sets with passive range of motion between if needed  Patient alternating during the day working on extension and flexion. Reinforced with patient that she should be able to touch her palm with her passive range of motion composite flexion for fifth during the day to be able to use it functionally.   Patient using silicone digit sleeve on left fifth digit at nighttime.   Kinesiotape done today over the lateral scar of proximal phalanges.  Done 2 strips in the ex 100% pull Patient educated to replace it during the day as needed and husband to assist for scar mobilization with range of motion.   Patient continues to have some tenderness over the A1 pulley of the fifth.      OT Education - 03/04/22 0816     Education Details exercises, ROM    Person(s) Educated Patient    Methods Explanation;Demonstration;Verbal cues;Handout    Comprehension Verbalized understanding;Returned demonstration;Verbal cues required              OT Short Term Goals - 02/11/22 1218       OT SHORT TERM GOAL #1   Title 1. Patient to be independent in home program to decrease  edema and decrease scar tissue to increase motion in left fifth digit    Baseline Stitches in place, edema - has coban on  -AROM , PIP 75 and ext -40 PIP    Time 3    Period Weeks    Status New    Target Date 03/04/22               OT Long Term Goals - 02/11/22 1220       OT LONG TERM GOAL #1   Title Patient left fifth digit flexion  active range of motion increase for patient to touch palm to drive and squeeze washcloth    Baseline MC80, PIP 75, AROM    Time 6    Period Weeks    Status New    Target Date 03/25/22      OT LONG TERM GOAL #2   Title 2.  Patient able to make composite fist with left fifth digit to squeeze washcloth with no increase symptoms    Baseline pain 4/10 -5/10 - AROM MC 80, PIP 75 - stitches in place 1 wk out from surgery    Time 6    Period Weeks    Status New    Target Date 03/25/22      OT LONG TERM GOAL #3   Title 3.  Patient's left grip strength increased to more than 75% compared to right hand to return to crafts and art activities without increase symptoms    Baseline NO strength yet - 1 wk out from hardware removal - AROM 80 MC , PIP 75- PIP ext -40 - pain 4-5/10    Time 6    Period Weeks    Status New    Target Date 03/25/22                   Plan - 03/04/22 0816     Clinical Impression Statement Patient s/p with left fifth ORIF of proximal phalanges on 11/14/21-patient was seen for therapy for several weeks with limited progress in endrange of flexion and extension limited by hardware.  Patient had hardware removed on 02/04/2022 and referred by orthopedist to OT for 3 times a week for 3 weeks to initiate early passive range of motion-active range of motion to increase flexion and extension at L fifth digit.  Patient was bandaged into composite flexion for the first week after surgery.  Patient continues to demonstrate increase passive and active range of motion in flexion of 5th the last 3 wks. able to get 90 degrees of PIP flexion  this date and touching palm prior to heat.  But appear patient is losing some active range of motion and PIP extension.  Can get passive range of motion full extension.  At this date Kinesiotape for daytime use over scar as well as light rubber band for PIP extension blocking MC and 0 degrees of extension..  Focusing on scar massage, PROM PIP flexion and extention.  Initiated this date paraffin wax for repeat prior to motion.  Patient to continue with buddy strap per Doctor's order.  Patient can continue to benefit from OT services to decrease scar tissue, decrease edema and increase range of motion and strength to return to previous level of function..    OT Occupational Profile and History Problem Focused Assessment - Including review of records relating to presenting problem    Occupational performance deficits (Please refer to evaluation for details): ADL's;IADL's;Leisure    Body Structure / Function / Physical Skills ADL;Scar mobility;UE functional use;IADL;Pain;Dexterity;FMC;ROM;Edema    Psychosocial Skills Habits;Environmental  Adaptations;Routines and Behaviors    Rehab Potential Good    Clinical Decision Making Limited treatment options, no task modification necessary    Comorbidities Affecting Occupational Performance: None    Modification or Assistance to Complete Evaluation  No modification of tasks or assist necessary to complete eval    OT Frequency 3x / week    OT Duration 6 weeks    OT Treatment/Interventions Self-care/ADL training;Cryotherapy;Paraffin;Therapeutic exercise;Fluidtherapy;Neuromuscular education;Manual Therapy;Scar mobilization;Splinting;Moist Heat;Contrast Bath;Energy conservation;Passive range of motion;Therapeutic activities;Patient/family  education    Consulted and Agree with Plan of Care Patient             Patient will benefit from skilled therapeutic intervention in order to improve the following deficits and impairments:   Body Structure / Function /  Physical Skills: ADL, Scar mobility, UE functional use, IADL, Pain, Dexterity, FMC, ROM, Edema   Psychosocial Skills: Habits, Environmental  Adaptations, Routines and Behaviors   Visit Diagnosis: Muscle weakness (generalized)  Stiffness of left hand, not elsewhere classified  Scar tissue  Pain in left hand    Problem List Patient Active Problem List   Diagnosis Date Noted   Vaginal atrophy 02/07/2016   Vulvitis 02/07/2016   Menopause 02/07/2016   Osteopenia 02/07/2016   HPV test positive 02/07/2016   Essential hypertension 02/07/2016   Diverticulosis 02/07/2016    Oletta Cohn, OTR/L,CLT 03/04/2022, 8:59 AM  [facility] [address], [place], [address] Phone: [phone]   Fax:  [phone]  Name: Christina Foley MRN: [id number] Date of Birth: [birth year]

## 2022-03-06 ENCOUNTER — Ambulatory Visit: Payer: PPO | Admitting: Occupational Therapy

## 2022-03-06 DIAGNOSIS — M5416 Radiculopathy, lumbar region: Secondary | ICD-10-CM | POA: Diagnosis not present

## 2022-03-06 DIAGNOSIS — M6281 Muscle weakness (generalized): Secondary | ICD-10-CM | POA: Diagnosis not present

## 2022-03-06 DIAGNOSIS — L905 Scar conditions and fibrosis of skin: Secondary | ICD-10-CM

## 2022-03-06 DIAGNOSIS — M79642 Pain in left hand: Secondary | ICD-10-CM

## 2022-03-06 DIAGNOSIS — M25642 Stiffness of left hand, not elsewhere classified: Secondary | ICD-10-CM

## 2022-03-06 NOTE — Therapy (Signed)
[place] [facility] [address], [place], [address] Phone: [phone]   Fax:  [phone]  Occupational Therapy Treatment  Patient Details  Name: Christina Foley MRN: [id number] Date of Birth: 06-Jan-1946 No data recorded  Encounter Date: 03/06/2022   OT End of Session - 03/06/22 1512     Visit Number 21    Number of Visits 25    Date for OT Re-Evaluation 03/25/22    OT Start Time 8101    OT Stop Time 1445    OT Time Calculation (min) 43 min    Activity Tolerance Patient tolerated treatment well    Behavior During Therapy [facility] for tasks assessed/performed             Past Medical History:  Diagnosis Date   Back pain    Cataract cortical, senile, left    DDD (degenerative disc disease), cervical    Decreased libido    Diverticulitis    Diverticulosis    Environmental allergies    seasonal   GERD (gastroesophageal reflux disease)    Heart murmur    1970s   HPV test positive    Hyperlipidemia    Hypertension    Menopausal and postmenopausal disorder    Menopause    Osteoarthritis    Osteopenia    Osteoporosis    Pneumonia    Postmenopausal atrophic vaginitis    Rectocele    Shoulder pain, right    rotator cuff tear   Thyroid nodule     Past Surgical History:  Procedure Laterality Date   BREAST CYST ASPIRATION Bilateral 2000   benign   BUNIONECTOMY Left    CATARACT EXTRACTION Left    COLONOSCOPY     1993, 2005, 2009   COLONOSCOPY WITH PROPOFOL N/A 03/31/2018   Procedure: COLONOSCOPY WITH PROPOFOL;  Surgeon: Manya Silvas, MD;  Location: [facility] ENDOSCOPY;  Service: Endoscopy;  Laterality: N/A;   CRYOTHERAPY     ESOPHAGOGASTRODUODENOSCOPY  1993   ESOPHAGOGASTRODUODENOSCOPY  05/24/2020   ESOPHAGOGASTRODUODENOSCOPY (EGD) WITH PROPOFOL N/A 03/31/2018   Procedure: ESOPHAGOGASTRODUODENOSCOPY (EGD) WITH PROPOFOL;  Surgeon: Manya Silvas, MD;  Location: [facility] ENDOSCOPY;  Service:  Endoscopy;  Laterality: N/A;   HARDWARE REMOVAL Left 02/04/2022   Procedure: Left little finger hardware removal;  Surgeon: Hessie Knows, MD;  Location: [facility] ORS;  Service: Orthopedics;  Laterality: Left;   OPEN REDUCTION INTERNAL FIXATION (ORIF) PROXIMAL PHALANX Left 11/14/2021   Procedure: Left, little finger proximal phalanx ORIF;  Surgeon: Hessie Knows, MD;  Location: [facility] ORS;  Service: Orthopedics;  Laterality: Left;  left little finger   SHOULDER ARTHROSCOPY Right 10/28/2017   Procedure: ARTHROSCOPY SHOULDER EITH DEBRIDEMENT DECOMPRESSIOIN AND REAPIR OF ROTATOR CUFF REPAIR;  Surgeon: Corky Mull, MD;  Location: [place];  Service: Orthopedics;  Laterality: Right;   TOTAL HIP ARTHROPLASTY Right 08/07/2014   TUBAL LIGATION      There were no vitals filed for this visit.   Subjective Assessment - 03/06/22 1447     Subjective  I did not had this much time for exercises.  We had a feeling of than a lunch meeting.  I feel like the strengthening is low but better.  In one of the times I was able to get my pinky down to my palm.    Pertinent History Christina Foley is a 76 y.o. female here today status post left little finger ORIF by Dr. Hessie Knows on 11/14/2021. Patient is doing well. Pain  well controlled. Still having some limitation with flexion and slight limitation with extension - had hand therapy but needed hard ware removal - seen Dr Rudene Christians 02/07/22 for follow-up evaluation status post left small finger hardware removal performed on 02/04/2022. She is here for initial evaluation after removal. She was splinted in flexion because she was having trouble flexion. Full flexion could be obtained. She comes back now for splint removal and starting range of motion. refer to OT    Patient Stated Goals Want my motion and strength back in L hand to return to my prior activities and travelling    Currently in Pain? No/denies               PIP ext -30 , PROM 0 Flexion PIP   90 Able to touch palm PROM            OT Treatments/Exercises (OP) - 03/06/22 0001       LUE Paraffin   Number Minutes Paraffin 8 Minutes    LUE Paraffin Location Hand    Comments LMB splint on for PIP extension on fifth             Paraffin wax with LMB splint for PIP extension.  Prior to soft tissue massage using mini massager and Grasston tool #2 for sweeping and brushing over volar fifth digit. Prior to passive range of motion for PIP extension Able to get full 0 degree PIP extension Reviewed again with patient rubber band for PIP extension blocking MC at 0 degrees.  Can do place and hold with MC flexion blocked with 0-placed on hold PIP extension 5 repetitions 4 sets with passive range of motion between if needed   Patient alternating during the day working on extension and flexion. Reinforced with patient that she should be able to touch her palm with her passive range of motion composite flexion for fifth during the day to be able to use it functionally.     Patient using silicone digit sleeve on left fifth digit at nighttime.   Kinesiotape over the lateral scar of proximal phalanges.  Done 2 strips in the ex 100% pull Patient educated to replace it during the day as needed and husband to assist for scar mobilization with range of motion.     Patient continues to have some tenderness over the A1 pulley of the fifth but decrease to 3/10           OT Education - 03/06/22 1512     Education Details exercises, ROM    Person(s) Educated Patient    Methods Explanation;Demonstration;Verbal cues;Handout    Comprehension Verbalized understanding;Returned demonstration;Verbal cues required              OT Short Term Goals - 02/11/22 1218       OT SHORT TERM GOAL #1   Title 1. Patient to be independent in home program to decrease edema and decrease scar tissue to increase motion in left fifth digit    Baseline Stitches in place, edema - has coban on  -AROM  80MC, PIP 75 and ext -40 PIP    Time 3    Period Weeks    Status New    Target Date 03/04/22               OT Long Term Goals - 02/11/22 1220       OT LONG TERM GOAL #1   Title Patient left fifth digit flexion active range of motion increase for patient  to touch palm to drive and squeeze washcloth    Baseline MC80, PIP 75, AROM    Time 6    Period Weeks    Status New    Target Date 03/25/22      OT LONG TERM GOAL #2   Title 2.  Patient able to make composite fist with left fifth digit to squeeze washcloth with no increase symptoms    Baseline pain 4/10 -5/10 - AROM MC 80, PIP 75 - stitches in place 1 wk out from surgery    Time 6    Period Weeks    Status New    Target Date 03/25/22      OT LONG TERM GOAL #3   Title 3.  Patient's left grip strength increased to more than 75% compared to right hand to return to crafts and art activities without increase symptoms    Baseline NO strength yet - 1 wk out from hardware removal - AROM 80 MC , PIP 75- PIP ext -40 - pain 4-5/10    Time 6    Period Weeks    Status New    Target Date 03/25/22                   Plan - 03/06/22 1512     Clinical Impression Statement Patient s/p with left fifth ORIF of proximal phalanges on 11/14/21-patient was seen for therapy for several weeks with limited progress in endrange of flexion and extension limited by hardware.  Patient had hardware removed on 02/04/2022 and referred by orthopedist to OT for 3 times a week for 3 weeks to initiate early passive range of motion-active range of motion to increase flexion and extension at L fifth digit.  Patient was bandaged into composite flexion for the first week after surgery.  Patient continues to demonstrate increase passive and active range of motion in flexion of 5th the last 3 wks. able to get 90 degrees of PIP flexion and touching palm prior to heat.  But appear patient is losing some active range of motion and PIP extension.  Can get passive  range of motion full extension.  This week started Kinesiotape for daytime use over scar as well as light rubber band for PIP extension blocking MC and 0 degrees of extension..  Focusing on scar massage, PROM PIP flexion and extention.  Initiated this week paraffin wax prior to range of motion scar tissue-was able to do repetitive passive range of motion for PIP flexion to 90 and PIP extension to 0.  Patient can continue to benefit from OT services to decrease scar tissue, decrease edema and increase range of motion and strength to return to previous level of function..    OT Occupational Profile and History Problem Focused Assessment - Including review of records relating to presenting problem    Occupational performance deficits (Please refer to evaluation for details): ADL's;IADL's;Leisure    Body Structure / Function / Physical Skills ADL;Scar mobility;UE functional use;IADL;Pain;Dexterity;FMC;ROM;Edema    Psychosocial Skills Habits;Environmental  Adaptations;Routines and Behaviors    Rehab Potential Good    Clinical Decision Making Limited treatment options, no task modification necessary    Comorbidities Affecting Occupational Performance: None    Modification or Assistance to Complete Evaluation  No modification of tasks or assist necessary to complete eval    OT Frequency 3x / week    OT Duration 6 weeks    OT Treatment/Interventions Self-care/ADL training;Cryotherapy;Paraffin;Therapeutic exercise;Fluidtherapy;Neuromuscular education;Manual Therapy;Scar mobilization;Splinting;Moist Heat;Contrast Bath;Energy conservation;Passive range of motion;Therapeutic  activities;Patient/family education    Consulted and Agree with Plan of Care Patient             Patient will benefit from skilled therapeutic intervention in order to improve the following deficits and impairments:   Body Structure / Function / Physical Skills: ADL, Scar mobility, UE functional use, IADL, Pain, Dexterity, FMC, ROM,  Edema   Psychosocial Skills: Habits, Environmental  Adaptations, Routines and Behaviors   Visit Diagnosis: Muscle weakness (generalized)  Stiffness of left hand, not elsewhere classified  Pain in left hand  Scar tissue    Problem List Patient Active Problem List   Diagnosis Date Noted   Vaginal atrophy 02/07/2016   Vulvitis 02/07/2016   Menopause 02/07/2016   Osteopenia 02/07/2016   HPV test positive 02/07/2016   Essential hypertension 02/07/2016   Diverticulosis 02/07/2016    Rosalyn Gess, OTR/L,CLT 03/06/2022, 3:16 PM  [place] [facility] [address] [place], [address] Phone: [phone]   Fax:  [phone]  Name: TRESSIA LABRUM MRN: [id number] Date of Birth: [birth year]

## 2022-03-07 ENCOUNTER — Ambulatory Visit: Payer: PPO | Admitting: Occupational Therapy

## 2022-03-07 DIAGNOSIS — M79642 Pain in left hand: Secondary | ICD-10-CM

## 2022-03-07 DIAGNOSIS — M6281 Muscle weakness (generalized): Secondary | ICD-10-CM

## 2022-03-07 DIAGNOSIS — L905 Scar conditions and fibrosis of skin: Secondary | ICD-10-CM

## 2022-03-07 DIAGNOSIS — M25642 Stiffness of left hand, not elsewhere classified: Secondary | ICD-10-CM

## 2022-03-07 NOTE — Therapy (Signed)
[place] [facility] [address], [place], [address] Phone: [phone]   Fax:  [phone]  Occupational Therapy Treatment  Patient Details  Name: Christina Foley MRN: [id number] Date of Birth: 12-27-1945 No data recorded  Encounter Date: 03/07/2022   OT End of Session - 03/07/22 0907     Visit Number 22    Number of Visits 25    Date for OT Re-Evaluation 03/25/22    OT Start Time 0828    OT Stop Time 0905    OT Time Calculation (min) 37 min    Activity Tolerance Patient tolerated treatment well    Behavior During Therapy [facility] for tasks assessed/performed             Past Medical History:  Diagnosis Date   Back pain    Cataract cortical, senile, left    DDD (degenerative disc disease), cervical    Decreased libido    Diverticulitis    Diverticulosis    Environmental allergies    seasonal   GERD (gastroesophageal reflux disease)    Heart murmur    1970s   HPV test positive    Hyperlipidemia    Hypertension    Menopausal and postmenopausal disorder    Menopause    Osteoarthritis    Osteopenia    Osteoporosis    Pneumonia    Postmenopausal atrophic vaginitis    Rectocele    Shoulder pain, right    rotator cuff tear   Thyroid nodule     Past Surgical History:  Procedure Laterality Date   BREAST CYST ASPIRATION Bilateral 2000   benign   BUNIONECTOMY Left    CATARACT EXTRACTION Left    COLONOSCOPY     1993, 2005, 2009   COLONOSCOPY WITH PROPOFOL N/A 03/31/2018   Procedure: COLONOSCOPY WITH PROPOFOL;  Surgeon: Manya Silvas, MD;  Location: [facility] ENDOSCOPY;  Service: Endoscopy;  Laterality: N/A;   CRYOTHERAPY     ESOPHAGOGASTRODUODENOSCOPY  1993   ESOPHAGOGASTRODUODENOSCOPY  05/24/2020   ESOPHAGOGASTRODUODENOSCOPY (EGD) WITH PROPOFOL N/A 03/31/2018   Procedure: ESOPHAGOGASTRODUODENOSCOPY (EGD) WITH PROPOFOL;  Surgeon: Manya Silvas, MD;  Location: [facility] ENDOSCOPY;  Service:  Endoscopy;  Laterality: N/A;   HARDWARE REMOVAL Left 02/04/2022   Procedure: Left little finger hardware removal;  Surgeon: Hessie Knows, MD;  Location: [facility] ORS;  Service: Orthopedics;  Laterality: Left;   OPEN REDUCTION INTERNAL FIXATION (ORIF) PROXIMAL PHALANX Left 11/14/2021   Procedure: Left, little finger proximal phalanx ORIF;  Surgeon: Hessie Knows, MD;  Location: [facility] ORS;  Service: Orthopedics;  Laterality: Left;  left little finger   SHOULDER ARTHROSCOPY Right 10/28/2017   Procedure: ARTHROSCOPY SHOULDER EITH DEBRIDEMENT DECOMPRESSIOIN AND REAPIR OF ROTATOR CUFF REPAIR;  Surgeon: Corky Mull, MD;  Location: [place];  Service: Orthopedics;  Laterality: Right;   TOTAL HIP ARTHROPLASTY Right 08/07/2014   TUBAL LIGATION      There were no vitals filed for this visit.   Subjective Assessment - 03/07/22 0906     Subjective  I done the exercises like you told me twice yesterday and I was really happy with the straightening as well as the bending and it really good.  We are leaving for [place] Sunday.    Pertinent History Christina Foley is a 76 y.o. female here today status post left little finger ORIF by Dr. Hessie Knows on 11/14/2021. Patient is doing well. Pain well controlled. Still having some limitation with flexion and slight limitation with  extension - had hand therapy but needed hard ware removal - seen Dr Rudene Christians 02/07/22 for follow-up evaluation status post left small finger hardware removal performed on 02/04/2022. She is here for initial evaluation after removal. She was splinted in flexion because she was having trouble flexion. Full flexion could be obtained. She comes back now for splint removal and starting range of motion. refer to OT    Patient Stated Goals Want my motion and strength back in L hand to return to my prior activities and travelling    Currently in Pain? No/denies                [facility] OT Assessment - 03/07/22 0001       Left Hand AROM   L  Little  MCP 0-90 90 Degrees    L Little PIP 0-100 80 Degrees   95 PROM; -25 AROM   L Little DIP 0-70 65 Degrees                      OT Treatments/Exercises (OP) - 03/07/22 0001       LUE Paraffin   Number Minutes Paraffin 8 Minutes    LUE Paraffin Location Hand    Comments LMB splint for PIP extension 5             Paraffin wax with LMB splint for PIP extension.  Prior to soft tissue massage using mini massager and Grasston tool #2 for sweeping and brushing over volar fifth digit. Prior to passive range of motion for PIP extension Able to get full 0 degree PIP extension Reviewed again with patient rubber band for PIP extension blocking MC at 0 degrees.  Can do place and hold with MC flexion blocked with 0-placed on hold PIP extension 5 repetitions 4 sets with passive range of motion between if needed   Patient alternating during the day working on extension and flexion. Reinforced with patient that she should be able to touch her palm with her passive range of motion composite flexion for fifth during the day to be able to use it functionally. Add super soft putty for gripping 10 reps with in between rolling for extension.  Patient to do that for a few days if pain-free.  Patient can upgrade to light blue extra soft gripping and rolling 2 sets of 10 or 12 pain-free Provided that for patient for home program while [place] of the country on [place] 10 to 14 days.  The above     Patient using silicone digit sleeve on left fifth digit at nighttime.   Kinesiotape over the lateral scar of proximal phalanges.  Done 2 strips in the ex 100% pull Patient educated to replace it during the day as needed and husband to assist for scar mobilization with range of motion.     Patient continues to have some tenderness over the A1 pulley of the fifth but decrease to 3/10             OT Education - 03/07/22 0907     Education Details exercises, ROM    Person(s) Educated Patient     Methods Explanation;Demonstration;Verbal cues;Handout    Comprehension Verbalized understanding;Returned demonstration;Verbal cues required              OT Short Term Goals - 02/11/22 1218       OT SHORT TERM GOAL #1   Title 1. Patient to be independent in home program to decrease edema and decrease scar tissue  to increase motion in left fifth digit    Baseline Stitches in place, edema - has coban on  -AROM 80MC, PIP 75 and ext -40 PIP    Time 3    Period Weeks    Status New    Target Date 03/04/22               OT Long Term Goals - 02/11/22 1220       OT LONG TERM GOAL #1   Title Patient left fifth digit flexion active range of motion increase for patient to touch palm to drive and squeeze washcloth    Baseline MC80, PIP 75, AROM    Time 6    Period Weeks    Status New    Target Date 03/25/22      OT LONG TERM GOAL #2   Title 2.  Patient able to make composite fist with left fifth digit to squeeze washcloth with no increase symptoms    Baseline pain 4/10 -5/10 - AROM MC 80, PIP 75 - stitches in place 1 wk out from surgery    Time 6    Period Weeks    Status New    Target Date 03/25/22      OT LONG TERM GOAL #3   Title 3.  Patient's left grip strength increased to more than 75% compared to right hand to return to crafts and art activities without increase symptoms    Baseline NO strength yet - 1 wk out from hardware removal - AROM 80 MC , PIP 75- PIP ext -40 - pain 4-5/10    Time 6    Period Weeks    Status New    Target Date 03/25/22                   Plan - 03/07/22 0907     Clinical Impression Statement Patient s/p with left fifth ORIF of proximal phalanges on 11/14/21-patient was seen for therapy for several weeks with limited progress in endrange of flexion and extension limited by hardware.  Patient had hardware removed on 02/04/2022 and referred by orthopedist to OT for 3 times a week for 3 weeks to initiate early passive range of  motion-active range of motion to increase flexion and extension at L fifth digit.  Patient was bandaged into composite flexion for the first week after surgery.  Patient continues to demonstrate increase passive and active range of motion in flexion of 5th the last 3 wks. able to get 90 degrees of PIP flexion and touching palm prior to heat.  But appeared patient was losing some active range of motion and PIP extension.  Can get passive range of motion full extension.  Initiated extension strengthening with rubber band blocking MC at 0 degrees and placed on hold PIP in extension.  Patient coming in this date with increase extension less swelling.  Patient  is going on vacation for about 10 days to 14 days.  Add light putty for gripping but patient to roll putty for digit extension in between.  This week started Kinesiotape for daytime use over scar.  Focusing on scar massage, PROM PIP flexion and extention.  Was able to do repetitive passive range of motion for PIP flexion to 90 and PIP extension to 0 with pain less than 2/10.  Patient can continue to benefit from OT services to decrease scar tissue, decrease edema and increase range of motion and strength to return to previous level of function.Marland Kitchen  OT Occupational Profile and History Problem Focused Assessment - Including review of records relating to presenting problem    Occupational performance deficits (Please refer to evaluation for details): ADL's;IADL's;Leisure    Body Structure / Function / Physical Skills ADL;Scar mobility;UE functional use;IADL;Pain;Dexterity;FMC;ROM;Edema    Psychosocial Skills Habits;Environmental  Adaptations;Routines and Behaviors    Rehab Potential Good    Clinical Decision Making Limited treatment options, no task modification necessary    Comorbidities Affecting Occupational Performance: None    Modification or Assistance to Complete Evaluation  No modification of tasks or assist necessary to complete eval    OT Frequency  3x / week    OT Duration 6 weeks    OT Treatment/Interventions Self-care/ADL training;Cryotherapy;Paraffin;Therapeutic exercise;Fluidtherapy;Neuromuscular education;Manual Therapy;Scar mobilization;Splinting;Moist Heat;Contrast Bath;Energy conservation;Passive range of motion;Therapeutic activities;Patient/family education    Consulted and Agree with Plan of Care Patient             Patient will benefit from skilled therapeutic intervention in order to improve the following deficits and impairments:   Body Structure / Function / Physical Skills: ADL, Scar mobility, UE functional use, IADL, Pain, Dexterity, FMC, ROM, Edema   Psychosocial Skills: Habits, Environmental  Adaptations, Routines and Behaviors   Visit Diagnosis: Muscle weakness (generalized)  Stiffness of left hand, not elsewhere classified  Pain in left hand  Scar tissue    Problem List Patient Active Problem List   Diagnosis Date Noted   Vaginal atrophy 02/07/2016   Vulvitis 02/07/2016   Menopause 02/07/2016   Osteopenia 02/07/2016   HPV test positive 02/07/2016   Essential hypertension 02/07/2016   Diverticulosis 02/07/2016    Rosalyn Gess, OTR/ 03/07/2022, 9:13 AM  [place] [facility] [address] [place], [address] Phone: [phone]   Fax:  [phone]  Name: Christina Foley MRN: [id number] Date of Birth: [birth year]

## 2022-03-20 DIAGNOSIS — A09 Infectious gastroenteritis and colitis, unspecified: Secondary | ICD-10-CM | POA: Diagnosis not present

## 2022-03-20 DIAGNOSIS — N3090 Cystitis, unspecified without hematuria: Secondary | ICD-10-CM | POA: Diagnosis not present

## 2022-03-21 DIAGNOSIS — M5136 Other intervertebral disc degeneration, lumbar region: Secondary | ICD-10-CM | POA: Diagnosis not present

## 2022-03-21 DIAGNOSIS — M7062 Trochanteric bursitis, left hip: Secondary | ICD-10-CM | POA: Diagnosis not present

## 2022-03-21 DIAGNOSIS — M5416 Radiculopathy, lumbar region: Secondary | ICD-10-CM | POA: Diagnosis not present

## 2022-03-21 DIAGNOSIS — M1612 Unilateral primary osteoarthritis, left hip: Secondary | ICD-10-CM | POA: Diagnosis not present

## 2022-03-21 DIAGNOSIS — M76892 Other specified enthesopathies of left lower limb, excluding foot: Secondary | ICD-10-CM | POA: Diagnosis not present

## 2022-03-24 ENCOUNTER — Ambulatory Visit: Payer: PPO | Attending: Orthopedic Surgery | Admitting: Occupational Therapy

## 2022-03-24 DIAGNOSIS — M79642 Pain in left hand: Secondary | ICD-10-CM | POA: Diagnosis not present

## 2022-03-24 DIAGNOSIS — M25642 Stiffness of left hand, not elsewhere classified: Secondary | ICD-10-CM | POA: Insufficient documentation

## 2022-03-24 DIAGNOSIS — L905 Scar conditions and fibrosis of skin: Secondary | ICD-10-CM | POA: Insufficient documentation

## 2022-03-24 DIAGNOSIS — A09 Infectious gastroenteritis and colitis, unspecified: Secondary | ICD-10-CM | POA: Diagnosis not present

## 2022-03-24 DIAGNOSIS — M6281 Muscle weakness (generalized): Secondary | ICD-10-CM | POA: Diagnosis not present

## 2022-03-24 NOTE — Therapy (Signed)
[facility] [address], [place], [address] Phone: [phone]   Fax:  [phone]  Occupational Therapy Treatment  Patient Details  Name: Christina Foley MRN: [id number] Date of Birth: [birth year] No data recorded  Encounter Date: 03/24/2022   OT End of Session - 03/24/22 1231     Visit Number 23    Number of Visits 25    Date for OT Re-Evaluation 03/25/22    OT Start Time 0900    OT Stop Time 0945    OT Time Calculation (min) 45 min    Activity Tolerance Patient tolerated treatment well    Behavior During Therapy [facility] for tasks assessed/performed             Past Medical History:  Diagnosis Date   Back pain    Cataract cortical, senile, left    DDD (degenerative disc disease), cervical    Decreased libido    Diverticulitis    Diverticulosis    Environmental allergies    seasonal   GERD (gastroesophageal reflux disease)    Heart murmur    1970s   HPV test positive    Hyperlipidemia    Hypertension    Menopausal and postmenopausal disorder    Menopause    Osteoarthritis    Osteopenia    Osteoporosis    Pneumonia    Postmenopausal atrophic vaginitis    Rectocele    Shoulder pain, right    rotator cuff tear   Thyroid nodule     Past Surgical History:  Procedure Laterality Date   BREAST CYST ASPIRATION Bilateral 2000   benign   BUNIONECTOMY Left    CATARACT EXTRACTION Left    COLONOSCOPY     1993, 2005, 2009   COLONOSCOPY WITH PROPOFOL N/A 03/31/2018   Procedure: COLONOSCOPY WITH PROPOFOL;  Surgeon: Manya Silvas, MD;  Location: [facility] ENDOSCOPY;  Service: Endoscopy;  Laterality: N/A;   CRYOTHERAPY     ESOPHAGOGASTRODUODENOSCOPY  1993   ESOPHAGOGASTRODUODENOSCOPY  05/24/2020   ESOPHAGOGASTRODUODENOSCOPY (EGD) WITH PROPOFOL N/A 03/31/2018   Procedure: ESOPHAGOGASTRODUODENOSCOPY (EGD) WITH PROPOFOL;  Surgeon: Manya Silvas, MD;  Location: [facility] ENDOSCOPY;  Service:  Endoscopy;  Laterality: N/A;   HARDWARE REMOVAL Left 02/04/2022   Procedure: Left little finger hardware removal;  Surgeon: Hessie Knows, MD;  Location: [facility] ORS;  Service: Orthopedics;  Laterality: Left;   OPEN REDUCTION INTERNAL FIXATION (ORIF) PROXIMAL PHALANX Left 11/14/2021   Procedure: Left, little finger proximal phalanx ORIF;  Surgeon: Hessie Knows, MD;  Location: [facility] ORS;  Service: Orthopedics;  Laterality: Left;  left little finger   SHOULDER ARTHROSCOPY Right 10/28/2017   Procedure: ARTHROSCOPY SHOULDER EITH DEBRIDEMENT DECOMPRESSIOIN AND REAPIR OF ROTATOR CUFF REPAIR;  Surgeon: Corky Mull, MD;  Location: [place];  Service: Orthopedics;  Laterality: Right;   TOTAL HIP ARTHROPLASTY Right 08/07/2014   TUBAL LIGATION      There were no vitals filed for this visit.   Subjective Assessment - 03/24/22 1229     Subjective  I tried to do some of my exercises while I was on vacation.  Did see Dr. Rudene Christians.  I feel like his lumbar straight and stronger.  But I could not get it to touch my palm like you did.    Pertinent History Christina Foley is a 76 y.o. female here today status post left little finger ORIF by Dr. Hessie Knows on 11/14/2021. Patient is doing well. Pain well controlled. Still having some  limitation with flexion and slight limitation with extension - had hand therapy but needed hard ware removal - seen Dr Rudene Christians 02/07/22 for follow-up evaluation status post left small finger hardware removal performed on 02/04/2022. She is here for initial evaluation after removal. She was splinted in flexion because she was having trouble flexion. Full flexion could be obtained. She comes back now for splint removal and starting range of motion. refer to OT    Patient Stated Goals Want my motion and strength back in L hand to return to my prior activities and travelling    Currently in Pain? No/denies                Christina Foley OT Assessment - 03/24/22 0001       Strength    Right Hand Grip (lbs) 50    Right Hand Lateral Pinch 7 lbs    Right Hand 3 Point Pinch 9.5 lbs    Left Hand Grip (lbs) 45    Left Hand Lateral Pinch 10 lbs    Left Hand 3 Point Pinch 9.5 lbs      Left Hand AROM   L Little  MCP 0-90 90 Degrees    L Little PIP 0-100 80 Degrees   PROM in session 95, place and hold able to touch palm   L Little DIP 0-70 65 Degrees               Patient arrived was able to maintain PIP extension on making composite flexion but only to 80 degrees of PIP flexion. Patient did show increase strength with resistance to DIP and PIP compared to 10 to 14 days before.   Patient report body that was provided for during vacation is very easy.    OT Treatments/Exercises (OP) - 03/24/22 0001       LUE Paraffin   Number Minutes Paraffin 8 Minutes    LUE Paraffin Location Hand    Comments coban wrap DIP/PIP flexion               Paraffin wax with DIP PIP flexion with Coban.   Prior prior to range of motion done [employer] #2 for sweeping and brushing over volar fifth digit and palm. Prior to passive range of motion for PIP extension Able to get full 0 degree PIP extension Reviewed again with patient rubber band for PIP extension blocking MC at 0 degrees.  Can do place and hold with MC flexion blocked with 0-placed on hold PIP extension 5 repetitions 4 sets with passive range of motion between if needed   Patient alternating during the day working on extension and flexion. Reinforced with patient that she should be able to touch her palm with her passive range of motion composite flexion for fifth during the day to be able to use it functionally.  Able to get patient composite fist with place and hold touching palm in session. Passive range of motion in PIP improved sensation to 95 but patient has to block MC at 90.  Upgrade to medium teal putty for gripping 10 reps with in between rolling for extension.gripping and rolling 2 sets of 10 or 12  pain-free      Patient using silicone digit sleeve on left fifth digit at nighttime.   Kinesiotape over the lateral scar of proximal phalanges.  Done 2 strips in the ex 100% pull Patient educated to replace it during the day as needed and husband to assist for scar mobilization with range of motion.  Tenderness over A1 pulley of the fifth improved greatly.      OT Education - 03/24/22 1231     Education Details exercises, ROM    Person(s) Educated Patient    Methods Explanation;Demonstration;Verbal cues;Handout    Comprehension Verbalized understanding;Returned demonstration;Verbal cues required              OT Short Term Goals - 02/11/22 1218       OT SHORT TERM GOAL #1   Title 1. Patient to be independent in home program to decrease edema and decrease scar tissue to increase motion in left fifth digit    Baseline Stitches in place, edema - has coban on  -AROM 80MC, PIP 75 and ext -40 PIP    Time 3    Period Weeks    Status New    Target Date 03/04/22               OT Long Term Goals - 02/11/22 1220       OT LONG TERM GOAL #1   Title Patient left fifth digit flexion active range of motion increase for patient to touch palm to drive and squeeze washcloth    Baseline MC80, PIP 75, AROM    Time 6    Period Weeks    Status New    Target Date 03/25/22      OT LONG TERM GOAL #2   Title 2.  Patient able to make composite fist with left fifth digit to squeeze washcloth with no increase symptoms    Baseline pain 4/10 -5/10 - AROM MC 80, PIP 75 - stitches in place 1 wk out from surgery    Time 6    Period Weeks    Status New    Target Date 03/25/22      OT LONG TERM GOAL #3   Title 3.  Patient's left grip strength increased to more than 75% compared to right hand to return to crafts and art activities without increase symptoms    Baseline NO strength yet - 1 wk out from hardware removal - AROM 80 MC , PIP 75- PIP ext -40 - pain 4-5/10    Time 6    Period  Weeks    Status New    Target Date 03/25/22                   Plan - 03/24/22 1320     Clinical Impression Statement Patient s/p with left fifth ORIF of proximal phalanges on 11/14/21-patient was seen for therapy for several weeks with limited progress in endrange of flexion and extension limited by hardware.  Patient had hardware removed on 02/04/2022 and referred by orthopedist to OT for 3 times a week for 3 weeks to initiate early passive range of motion-active range of motion to increase flexion and extension at L fifth digit.  Patient is 7 weeks post hardware removal.  Patient return after not being seen for about 10 to 14 days while on vacation.  Patient do show increase strength and PIP and DIP extension.  Flexion at PIP still limited to 80 degrees active range of motion but in session can increase to 95 passive range of motion.  In session patient was able to do place and hold touching palm with composite flexion.  Was able to increase patient's putty for grip strength to medium teal.  Patient to continue with rubber band for PIP DIP extension strengthening.  While advancing flexion..  Patient can continue to  benefit from OT services to decrease scar tissue, decrease edema and increase range of motion and strength to return to previous level of function..    OT Occupational Profile and History Problem Focused Assessment - Including review of records relating to presenting problem    Occupational performance deficits (Please refer to evaluation for details): ADL's;IADL's;Leisure    Body Structure / Function / Physical Skills ADL;Scar mobility;UE functional use;IADL;Pain;Dexterity;FMC;ROM;Edema    Psychosocial Skills Habits;Environmental  Adaptations;Routines and Behaviors    Rehab Potential Good    Clinical Decision Making Limited treatment options, no task modification necessary    Comorbidities Affecting Occupational Performance: None    Modification or Assistance to Complete  Evaluation  No modification of tasks or assist necessary to complete eval    OT Frequency 2x / week    OT Duration 6 weeks    OT Treatment/Interventions Self-care/ADL training;Cryotherapy;Paraffin;Therapeutic exercise;Fluidtherapy;Neuromuscular education;Manual Therapy;Scar mobilization;Splinting;Moist Heat;Contrast Bath;Energy conservation;Passive range of motion;Therapeutic activities;Patient/family education    Consulted and Agree with Plan of Care Patient             Patient will benefit from skilled therapeutic intervention in order to improve the following deficits and impairments:   Body Structure / Function / Physical Skills: ADL, Scar mobility, UE functional use, IADL, Pain, Dexterity, FMC, ROM, Edema   Psychosocial Skills: Habits, Environmental  Adaptations, Routines and Behaviors   Visit Diagnosis: Pain in left hand  Scar tissue  Muscle weakness (generalized)    Problem List Patient Active Problem List   Diagnosis Date Noted   Vaginal atrophy 02/07/2016   Vulvitis 02/07/2016   Menopause 02/07/2016   Osteopenia 02/07/2016   HPV test positive 02/07/2016   Essential hypertension 02/07/2016   Diverticulosis 02/07/2016    Christina Foley, OTR/L,CLT 03/24/2022, 1:25 PM  [facility] [address], [place], [address] Phone: [phone]   Fax:  [phone]  Name: Christina Foley MRN: [id number] Date of Birth: Jan 08, 1946

## 2022-03-26 DIAGNOSIS — M5416 Radiculopathy, lumbar region: Secondary | ICD-10-CM | POA: Diagnosis not present

## 2022-03-27 ENCOUNTER — Ambulatory Visit: Payer: PPO | Admitting: Occupational Therapy

## 2022-04-03 ENCOUNTER — Ambulatory Visit: Payer: PPO | Admitting: Occupational Therapy

## 2022-04-03 ENCOUNTER — Encounter: Payer: Self-pay | Admitting: Occupational Therapy

## 2022-04-03 DIAGNOSIS — M6281 Muscle weakness (generalized): Secondary | ICD-10-CM

## 2022-04-03 DIAGNOSIS — M25642 Stiffness of left hand, not elsewhere classified: Secondary | ICD-10-CM

## 2022-04-03 DIAGNOSIS — M79642 Pain in left hand: Secondary | ICD-10-CM | POA: Diagnosis not present

## 2022-04-03 DIAGNOSIS — L905 Scar conditions and fibrosis of skin: Secondary | ICD-10-CM

## 2022-04-04 NOTE — Therapy (Signed)
[facility] [address], [place], [address] Phone: [phone]   Fax:  [phone]  Occupational Therapy Treatment/Recertification  Patient Details  Name: Christina Foley MRN: [id number] Date of Birth: 1945-10-03 No data recorded  Encounter Date: 04/03/2022  Rationale for Evaluation and Treatment Rehabilitation    OT End of Session - 04/04/22 0857     Visit Number 24    Number of Visits 36    Date for OT Re-Evaluation 05/15/22    OT Start Time 0945    OT Stop Time 1030    OT Time Calculation (min) 45 min    Activity Tolerance Patient tolerated treatment well    Behavior During Therapy [facility] for tasks assessed/performed             Past Medical History:  Diagnosis Date   Back pain    Cataract cortical, senile, left    DDD (degenerative disc disease), cervical    Decreased libido    Diverticulitis    Diverticulosis    Environmental allergies    seasonal   GERD (gastroesophageal reflux disease)    Heart murmur    1970s   HPV test positive    Hyperlipidemia    Hypertension    Menopausal and postmenopausal disorder    Menopause    Osteoarthritis    Osteopenia    Osteoporosis    Pneumonia    Postmenopausal atrophic vaginitis    Rectocele    Shoulder pain, right    rotator cuff tear   Thyroid nodule     Past Surgical History:  Procedure Laterality Date   BREAST CYST ASPIRATION Bilateral 2000   benign   BUNIONECTOMY Left    CATARACT EXTRACTION Left    COLONOSCOPY     1993, 2005, 2009   COLONOSCOPY WITH PROPOFOL N/A 03/31/2018   Procedure: COLONOSCOPY WITH PROPOFOL;  Surgeon: Manya Silvas, MD;  Location: [facility] ENDOSCOPY;  Service: Endoscopy;  Laterality: N/A;   CRYOTHERAPY     ESOPHAGOGASTRODUODENOSCOPY  1993   ESOPHAGOGASTRODUODENOSCOPY  05/24/2020   ESOPHAGOGASTRODUODENOSCOPY (EGD) WITH PROPOFOL N/A 03/31/2018   Procedure: ESOPHAGOGASTRODUODENOSCOPY (EGD) WITH  PROPOFOL;  Surgeon: Manya Silvas, MD;  Location: [facility] ENDOSCOPY;  Service: Endoscopy;  Laterality: N/A;   HARDWARE REMOVAL Left 02/04/2022   Procedure: Left little finger hardware removal;  Surgeon: Hessie Knows, MD;  Location: [facility] ORS;  Service: Orthopedics;  Laterality: Left;   OPEN REDUCTION INTERNAL FIXATION (ORIF) PROXIMAL PHALANX Left 11/14/2021   Procedure: Left, little finger proximal phalanx ORIF;  Surgeon: Hessie Knows, MD;  Location: [facility] ORS;  Service: Orthopedics;  Laterality: Left;  left little finger   SHOULDER ARTHROSCOPY Right 10/28/2017   Procedure: ARTHROSCOPY SHOULDER EITH DEBRIDEMENT DECOMPRESSIOIN AND REAPIR OF ROTATOR CUFF REPAIR;  Surgeon: Corky Mull, MD;  Location: [place];  Service: Orthopedics;  Laterality: Right;   TOTAL HIP ARTHROPLASTY Right 08/07/2014   TUBAL LIGATION      There were no vitals filed for this visit.        [facility] OT Assessment - 04/04/22 1903       Left Hand AROM   L Little  MCP 0-90 90 Degrees   0 degrees extension   L Little PIP 0-100 80 Degrees   -30 extension   L Little DIP 0-70 85 Degrees   -15 extension.            Paraffin to left hand and digits for 8 mins, hand wrapped in  coban for DIP and PIP flexion to increase tissue mobility and improve ROM.    Measurements taken this date, refer to flow sheet for details.    PROM for PIP extension Place and hold with MC flexion blocked, PIP extension 5 reps for 4 sets Continuing to alternate flexion and extension during the day.   Pt continues to work towards composite flexion, place and hold with touching palm during session.    Review of putty exercises, pt with teal putty, performed gripping, rolling for extension and to increase to 3 sets of 12 this week.  Added putty exercise for placing small finger on putty made into the shape of a hamburger patty and pushing down 5th finger and pulling back.  Making meatballs and then flattening with small finger pushing  into extension, cues for placement of putty near PIP and then DIP.  Pt able to demonstrate.    Continue with silicone digit sleeve at night. Pt will also continue with kinesiotape at home with husband to assist.                 OT Education - 04/04/22 0857     Education Details exercises, ROM    Person(s) Educated Patient    Methods Explanation;Demonstration;Verbal cues;Handout    Comprehension Verbalized understanding;Returned demonstration;Verbal cues required              OT Short Term Goals - 04/04/22 2033       OT SHORT TERM GOAL #1   Title 1. Patient to be independent in home program to decrease edema and decrease scar tissue to increase motion in left fifth digit    Baseline Stitches in place, edema - has coban on  -AROM 80MC, PIP 75 and ext -40 PIP, cont to upgrade HEP as pt progresses    Time 3    Period Weeks    Status On-going    Target Date 05/15/22               OT Long Term Goals - 04/04/22 2034       OT LONG TERM GOAL #1   Title Patient left fifth digit flexion active range of motion increase for patient to touch palm to drive and squeeze washcloth    Baseline MC80, PIP 75, AROM    Time 6    Period Weeks    Status On-going    Target Date 05/15/22      OT LONG TERM GOAL #2   Title 2.  Patient able to make composite fist with left fifth digit to squeeze washcloth with no increase symptoms    Baseline pain 4/10 -5/10 - AROM MC 80, PIP 75 - stitches in place 1 wk out from surgery    Time 6    Period Weeks    Status On-going    Target Date 03/25/22      OT LONG TERM GOAL #3   Title 3.  Patient's left grip strength increased to more than 75% compared to right hand to return to crafts and art activities without increase symptoms    Baseline NO strength yet - 1 wk out from hardware removal - AROM 80 MC , PIP 75- PIP ext -40 - pain 4-5/10    Time 6    Period Weeks    Status On-going    Target Date 05/15/22                    Plan - 04/04/22 0973  Clinical Impression Statement Patient s/p with left fifth ORIF of proximal phalanges on 11/14/21-patient was seen for therapy for several weeks with limited progress in endrange of flexion and extension limited by hardware.  Patient had hardware removed on 02/04/2022 and referred by orthopedist to OT for 3 times a week for 3 weeks to initiate early passive range of motion-active range of motion to increase flexion and extension at L fifth digit.  Patient is 8 weeks post hardware removal.  .  Patient continues to show increased strength and PIP and DIP extension.  Flexion at PIP still limited to 80 degrees active range of motion but in session can increase to 95 passive range of motion.  In session patient was able to do place and hold touching palm with composite flexion.  Increased sets for teal putty and added a couple exercises for extension. Patient to continue with rubber band for PIP DIP extension strengthening.  While advancing flexion..  Patient can continue to benefit from OT services to decrease scar tissue, decrease edema and increase range of motion and strength to return to previous level of function..    OT Occupational Profile and History Problem Focused Assessment - Including review of records relating to presenting problem    Occupational performance deficits (Please refer to evaluation for details): ADL's;IADL's;Leisure    Body Structure / Function / Physical Skills ADL;Scar mobility;UE functional use;IADL;Pain;Dexterity;FMC;ROM;Edema    Psychosocial Skills Habits;Environmental  Adaptations;Routines and Behaviors    Rehab Potential Good    Clinical Decision Making Limited treatment options, no task modification necessary    Comorbidities Affecting Occupational Performance: None    Modification or Assistance to Complete Evaluation  No modification of tasks or assist necessary to complete eval    OT Frequency 2x / week    OT Duration 6 weeks    OT  Treatment/Interventions Self-care/ADL training;Cryotherapy;Paraffin;Therapeutic exercise;Fluidtherapy;Neuromuscular education;Manual Therapy;Scar mobilization;Splinting;Moist Heat;Contrast Bath;Energy conservation;Passive range of motion;Therapeutic activities;Patient/family education    Consulted and Agree with Plan of Care Patient             Patient will benefit from skilled therapeutic intervention in order to improve the following deficits and impairments:   Body Structure / Function / Physical Skills: ADL, Scar mobility, UE functional use, IADL, Pain, Dexterity, FMC, ROM, Edema   Psychosocial Skills: Habits, Environmental  Adaptations, Routines and Behaviors   Visit Diagnosis: Pain in left hand  Scar tissue  Muscle weakness (generalized)  Stiffness of left hand, not elsewhere classified    Problem List Patient Active Problem List   Diagnosis Date Noted   Vaginal atrophy 02/07/2016   Vulvitis 02/07/2016   Menopause 02/07/2016   Osteopenia 02/07/2016   HPV test positive 02/07/2016   Essential hypertension 02/07/2016   Diverticulosis 02/07/2016   Jan Walters T Jamisyn Langer, OTR/L, CLT  Fleta Borgeson, OT 04/04/2022, 8:49 PM  [facility] [address] [place], [address] Phone: [phone]   Fax:  [phone]  Name: LAYTOYA ION MRN: [id number] Date of Birth: 1945/12/20

## 2022-04-08 DIAGNOSIS — M5416 Radiculopathy, lumbar region: Secondary | ICD-10-CM | POA: Diagnosis not present

## 2022-04-15 ENCOUNTER — Ambulatory Visit: Payer: PPO | Admitting: Occupational Therapy

## 2022-04-17 ENCOUNTER — Ambulatory Visit: Payer: PPO | Attending: Orthopedic Surgery | Admitting: Occupational Therapy

## 2022-04-17 DIAGNOSIS — L905 Scar conditions and fibrosis of skin: Secondary | ICD-10-CM | POA: Insufficient documentation

## 2022-04-17 DIAGNOSIS — M25642 Stiffness of left hand, not elsewhere classified: Secondary | ICD-10-CM | POA: Insufficient documentation

## 2022-04-17 DIAGNOSIS — M6281 Muscle weakness (generalized): Secondary | ICD-10-CM | POA: Diagnosis not present

## 2022-04-17 DIAGNOSIS — M79642 Pain in left hand: Secondary | ICD-10-CM | POA: Diagnosis not present

## 2022-04-17 NOTE — Therapy (Signed)
[facility] [address] [place], [place], [address] Phone: [phone]   Fax:  [phone]  Occupational Therapy Treatment  Patient Details  Name: Christina Foley MRN: [id number] Date of Birth: [birth year] No data recorded  Encounter Date: 04/17/2022   OT End of Session - 04/17/22 0842     Visit Number 25    Number of Visits 65    Date for OT Re-Evaluation 05/15/22    OT Start Time 0815    OT Stop Time 0855    OT Time Calculation (min) 40 min    Activity Tolerance Patient tolerated treatment well    Behavior During Therapy [facility] for tasks assessed/performed             Past Medical History:  Diagnosis Date   Back pain    Cataract cortical, senile, left    DDD (degenerative disc disease), cervical    Decreased libido    Diverticulitis    Diverticulosis    Environmental allergies    seasonal   GERD (gastroesophageal reflux disease)    Heart murmur    1970s   HPV test positive    Hyperlipidemia    Hypertension    Menopausal and postmenopausal disorder    Menopause    Osteoarthritis    Osteopenia    Osteoporosis    Pneumonia    Postmenopausal atrophic vaginitis    Rectocele    Shoulder pain, right    rotator cuff tear   Thyroid nodule     Past Surgical History:  Procedure Laterality Date   BREAST CYST ASPIRATION Bilateral 2000   benign   BUNIONECTOMY Left    CATARACT EXTRACTION Left    COLONOSCOPY     1993, 2005, 2009   COLONOSCOPY WITH PROPOFOL N/A 03/31/2018   Procedure: COLONOSCOPY WITH PROPOFOL;  Surgeon: Manya Silvas, MD;  Location: [facility] ENDOSCOPY;  Service: Endoscopy;  Laterality: N/A;   CRYOTHERAPY     ESOPHAGOGASTRODUODENOSCOPY  1993   ESOPHAGOGASTRODUODENOSCOPY  05/24/2020   ESOPHAGOGASTRODUODENOSCOPY (EGD) WITH PROPOFOL N/A 03/31/2018   Procedure: ESOPHAGOGASTRODUODENOSCOPY (EGD) WITH PROPOFOL;  Surgeon: Manya Silvas, MD;  Location: [facility] ENDOSCOPY;  Service:  Endoscopy;  Laterality: N/A;   HARDWARE REMOVAL Left 02/04/2022   Procedure: Left little finger hardware removal;  Surgeon: Hessie Knows, MD;  Location: [facility] ORS;  Service: Orthopedics;  Laterality: Left;   OPEN REDUCTION INTERNAL FIXATION (ORIF) PROXIMAL PHALANX Left 11/14/2021   Procedure: Left, little finger proximal phalanx ORIF;  Surgeon: Hessie Knows, MD;  Location: [facility] ORS;  Service: Orthopedics;  Laterality: Left;  left little finger   SHOULDER ARTHROSCOPY Right 10/28/2017   Procedure: ARTHROSCOPY SHOULDER EITH DEBRIDEMENT DECOMPRESSIOIN AND REAPIR OF ROTATOR CUFF REPAIR;  Surgeon: Corky Mull, MD;  Location: [facility];  Service: Orthopedics;  Laterality: Right;   TOTAL HIP ARTHROPLASTY Right 08/07/2014   TUBAL LIGATION      There were no vitals filed for this visit.   Subjective Assessment - 04/17/22 0842     Subjective  I am doing okay.  The extension is getting better little stronger.  I do the paraffin once or twice a day.  I got once down to my palm.    Pertinent History Christina Foley is a 76 y.o. female here today status post left little finger ORIF by Dr. Hessie Knows on 11/14/2021. Patient is doing well. Pain well controlled. Still having some limitation with flexion and slight limitation with extension - had hand  therapy but needed hard ware removal - seen Dr Rudene Christians 02/07/22 for follow-up evaluation status post left small finger hardware removal performed on 02/04/2022. She is here for initial evaluation after removal. She was splinted in flexion because she was having trouble flexion. Full flexion could be obtained. She comes back now for splint removal and starting range of motion. refer to OT    Patient Stated Goals Want my motion and strength back in L hand to return to my prior activities and travelling    Currently in Pain? Yes    Pain Score 4     Pain Location Finger (Comment which one)    Pain Orientation Left    Pain Descriptors / Indicators  Aching;Tightness    Pain Type Surgical pain    Aggravating Factors  PROM of 5th                [facility] OT Assessment - 04/17/22 0001       Strength   Right Hand Grip (lbs) 45    Right Hand Lateral Pinch 7 lbs    Right Hand 3 Point Pinch 9.5 lbs    Left Hand Grip (lbs) 45    Left Hand Lateral Pinch 10 lbs    Left Hand 3 Point Pinch 10 lbs      Left Hand AROM   L Little  MCP 0-90 95 Degrees    L Little PIP 0-100 75 Degrees   session 95 and ext -25   L Little DIP 0-70 60 Degrees   -10                     OT Treatments/Exercises (OP) - 04/17/22 0001       LUE Paraffin   Number Minutes Paraffin 8 Minutes    LUE Paraffin Location Hand    Comments Coban flexion wrap to 5th - hesatingpad            Paraffin wax with DIP PIP flexion with Coban.   Prior prior to range of motion done [employer] #2 for sweeping and brushing over volar fifth digit and palm. Joint mobs to PIP - resulting in decrease pain and increase ease tolerating PROM flexion PIP Able to get full 0 degree PIP extension Patient to continue with rubber band for PIP extension blocking MC at 0 degrees.  Can do place and hold with MC flexion blocked with 0-placed on hold PIP extension 5 repetitions 4 sets with passive range of motion between if needed   Patient alternating during the day working on extension and flexion. Reinforced with patient that she should be able to touch her palm with her passive range of motion composite flexion for fifth during the day to be able to use it functionally.  Able to get patient composite fist with place and hold touching palm in session. Passive range of motion in PIP improved sensation to 95 but patient has to block MC at 90.   Continue medium teal putty for gripping 10 reps with in between rolling for extension.gripping and rolling 2 sets of 10 or 12 pain-free Add small   ball of teal putty -for endrange pinching with fifth digit  10 reps -     Patient using  silicone digit sleeve on left fifth digit at nighttime.            OT Education - 04/17/22 [phone]     Education Details exercises, ROM    Person(s) Educated Patient  Methods Explanation;Demonstration;Verbal cues;Handout    Comprehension Verbalized understanding;Returned demonstration;Verbal cues required              OT Short Term Goals - 04/04/22 2033       OT SHORT TERM GOAL #1   Title 1. Patient to be independent in home program to decrease edema and decrease scar tissue to increase motion in left fifth digit    Baseline Stitches in place, edema - has coban on  -AROM 80MC, PIP 75 and ext -40 PIP, cont to upgrade HEP as pt progresses    Time 3    Period Weeks    Status On-going    Target Date 05/15/22               OT Long Term Goals - 04/04/22 2034       OT LONG TERM GOAL #1   Title Patient left fifth digit flexion active range of motion increase for patient to touch palm to drive and squeeze washcloth    Baseline MC80, PIP 75, AROM    Time 6    Period Weeks    Status On-going    Target Date 05/15/22      OT LONG TERM GOAL #2   Title 2.  Patient able to make composite fist with left fifth digit to squeeze washcloth with no increase symptoms    Baseline pain 4/10 -5/10 - AROM MC 80, PIP 75 - stitches in place 1 wk out from surgery    Time 6    Period Weeks    Status On-going    Target Date 03/25/22      OT LONG TERM GOAL #3   Title 3.  Patient's left grip strength increased to more than 75% compared to right hand to return to crafts and art activities without increase symptoms    Baseline NO strength yet - 1 wk out from hardware removal - AROM 80 MC , PIP 75- PIP ext -40 - pain 4-5/10    Time 6    Period Weeks    Status On-going    Target Date 05/15/22                   Plan - 04/17/22 0843     Clinical Impression Statement Patient s/p with left fifth ORIF of proximal phalanges on 11/14/21-patient was seen for therapy for several weeks  with limited progress in endrange of flexion and extension limited by hardware.  Patient had hardware removed on 02/04/2022 and referred by orthopedist to OT for 3 times a week for 3 weeks to initiate early passive range of motion-active range of motion to increase flexion and extension at L fifth digit.  Patient is 8 weeks post hardware removal.  Patient walking in with PIP flexion is 75 but able to get in session to 95 with pain less than a 4/10.  Patient was able to do active range of motion place and hold touching palm today.  While maintaining extension at -25 PIP DIP -10.  Great improvement in edema.  As well as grip continues to be 45 pounds and right 50 pounds.. Patient to continue with rubber band for PIP DIP extension strengthening.  While advancing flexion..  Patient can continue to benefit from OT services to decrease scar tissue, decrease edema and increase range of motion and strength to return to previous level of function..    OT Occupational Profile and History Problem Focused Assessment - Including review of records relating to presenting  problem    Occupational performance deficits (Please refer to evaluation for details): ADL's;IADL's;Leisure    Body Structure / Function / Physical Skills ADL;Scar mobility;UE functional use;IADL;Pain;Dexterity;FMC;ROM;Edema    Psychosocial Skills Habits;Environmental  Adaptations;Routines and Behaviors    Rehab Potential Good    Clinical Decision Making Limited treatment options, no task modification necessary    Comorbidities Affecting Occupational Performance: None    Modification or Assistance to Complete Evaluation  No modification of tasks or assist necessary to complete eval    OT Frequency 2x / week    OT Duration 6 weeks    OT Treatment/Interventions Self-care/ADL training;Cryotherapy;Paraffin;Therapeutic exercise;Fluidtherapy;Neuromuscular education;Manual Therapy;Scar mobilization;Splinting;Moist Heat;Contrast Bath;Energy conservation;Passive  range of motion;Therapeutic activities;Patient/family education    Consulted and Agree with Plan of Care Patient             Patient will benefit from skilled therapeutic intervention in order to improve the following deficits and impairments:   Body Structure / Function / Physical Skills: ADL, Scar mobility, UE functional use, IADL, Pain, Dexterity, FMC, ROM, Edema   Psychosocial Skills: Habits, Environmental  Adaptations, Routines and Behaviors   Visit Diagnosis: Pain in left hand  Scar tissue  Muscle weakness (generalized)  Stiffness of left hand, not elsewhere classified    Problem List Patient Active Problem List   Diagnosis Date Noted   Vaginal atrophy 02/07/2016   Vulvitis 02/07/2016   Menopause 02/07/2016   Osteopenia 02/07/2016   HPV test positive 02/07/2016   Essential hypertension 02/07/2016   Diverticulosis 02/07/2016    Rosalyn Gess, OTR/L,CLT 04/17/2022, 9:02 AM  [facility] [address] [place], [address] Phone: [phone]   Fax:  [phone]  Name: Christina Foley MRN: [id number] Date of Birth: August 25, 1946

## 2022-04-21 DIAGNOSIS — E042 Nontoxic multinodular goiter: Secondary | ICD-10-CM | POA: Diagnosis not present

## 2022-05-05 ENCOUNTER — Encounter: Payer: PPO | Admitting: Occupational Therapy

## 2022-05-05 DIAGNOSIS — M5416 Radiculopathy, lumbar region: Secondary | ICD-10-CM | POA: Diagnosis not present

## 2022-05-06 ENCOUNTER — Ambulatory Visit: Payer: PPO | Admitting: Occupational Therapy

## 2022-05-06 DIAGNOSIS — M25642 Stiffness of left hand, not elsewhere classified: Secondary | ICD-10-CM

## 2022-05-06 DIAGNOSIS — M79642 Pain in left hand: Secondary | ICD-10-CM | POA: Diagnosis not present

## 2022-05-06 DIAGNOSIS — M6281 Muscle weakness (generalized): Secondary | ICD-10-CM

## 2022-05-06 DIAGNOSIS — M810 Age-related osteoporosis without current pathological fracture: Secondary | ICD-10-CM | POA: Diagnosis not present

## 2022-05-06 DIAGNOSIS — L905 Scar conditions and fibrosis of skin: Secondary | ICD-10-CM

## 2022-05-06 DIAGNOSIS — E042 Nontoxic multinodular goiter: Secondary | ICD-10-CM | POA: Diagnosis not present

## 2022-05-06 NOTE — Therapy (Signed)
[facility] [address], [place], [address] Phone: [phone]   Fax:  [phone]  Occupational Therapy Treatment  Patient Details  Name: Christina Foley MRN: [id number] Date of Birth: 1946/04/08 No data recorded  Encounter Date: 05/06/2022   OT End of Session - 05/06/22 1135     Visit Number 26    Number of Visits 73    Date for OT Re-Evaluation 05/15/22    OT Start Time 1607    OT Stop Time 1115    OT Time Calculation (min) 30 min    Activity Tolerance Patient tolerated treatment well    Behavior During Therapy [facility] for tasks assessed/performed             Past Medical History:  Diagnosis Date   Back pain    Cataract cortical, senile, left    DDD (degenerative disc disease), cervical    Decreased libido    Diverticulitis    Diverticulosis    Environmental allergies    seasonal   GERD (gastroesophageal reflux disease)    Heart murmur    1970s   HPV test positive    Hyperlipidemia    Hypertension    Menopausal and postmenopausal disorder    Menopause    Osteoarthritis    Osteopenia    Osteoporosis    Pneumonia    Postmenopausal atrophic vaginitis    Rectocele    Shoulder pain, right    rotator cuff tear   Thyroid nodule     Past Surgical History:  Procedure Laterality Date   BREAST CYST ASPIRATION Bilateral 2000   benign   BUNIONECTOMY Left    CATARACT EXTRACTION Left    COLONOSCOPY     1993, 2005, 2009   COLONOSCOPY WITH PROPOFOL N/A 03/31/2018   Procedure: COLONOSCOPY WITH PROPOFOL;  Surgeon: Manya Silvas, MD;  Location: [facility] ENDOSCOPY;  Service: Endoscopy;  Laterality: N/A;   CRYOTHERAPY     ESOPHAGOGASTRODUODENOSCOPY  1993   ESOPHAGOGASTRODUODENOSCOPY  05/24/2020   ESOPHAGOGASTRODUODENOSCOPY (EGD) WITH PROPOFOL N/A 03/31/2018   Procedure: ESOPHAGOGASTRODUODENOSCOPY (EGD) WITH PROPOFOL;  Surgeon: Manya Silvas, MD;  Location: [facility] ENDOSCOPY;  Service:  Endoscopy;  Laterality: N/A;   HARDWARE REMOVAL Left 02/04/2022   Procedure: Left little finger hardware removal;  Surgeon: Hessie Knows, MD;  Location: [facility] ORS;  Service: Orthopedics;  Laterality: Left;   OPEN REDUCTION INTERNAL FIXATION (ORIF) PROXIMAL PHALANX Left 11/14/2021   Procedure: Left, little finger proximal phalanx ORIF;  Surgeon: Hessie Knows, MD;  Location: [facility] ORS;  Service: Orthopedics;  Laterality: Left;  left little finger   SHOULDER ARTHROSCOPY Right 10/28/2017   Procedure: ARTHROSCOPY SHOULDER EITH DEBRIDEMENT DECOMPRESSIOIN AND REAPIR OF ROTATOR CUFF REPAIR;  Surgeon: Corky Mull, MD;  Location: [facility];  Service: Orthopedics;  Laterality: Right;   TOTAL HIP ARTHROPLASTY Right 08/07/2014   TUBAL LIGATION      There were no vitals filed for this visit.   Subjective Assessment - 05/06/22 1132     Subjective  I can get it to touch my palm - but still stiff and I did  not do my exercises as  much when I was on vacation - but using it more- little more strength - putty easy    Pertinent History Christina Foley is a 76 y.o. female here today status post left little finger ORIF by Dr. Hessie Knows on 11/14/2021. Patient is doing well. Pain well controlled. Still having some limitation with  flexion and slight limitation with extension - had hand therapy but needed hard ware removal - seen Dr Rudene Christians 02/07/22 for follow-up evaluation status post left small finger hardware removal performed on 02/04/2022. She is here for initial evaluation after removal. She was splinted in flexion because she was having trouble flexion. Full flexion could be obtained. She comes back now for splint removal and starting range of motion. refer to OT    Patient Stated Goals Want my motion and strength back in L hand to return to my prior activities and travelling    Currently in Pain? No/denies                [facility] OT Assessment - 05/06/22 0001       Strength   Right Hand Grip  (lbs) 53    Right Hand Lateral Pinch 8 lbs    Right Hand 3 Point Pinch 9 lbs    Left Hand Grip (lbs) 49    Left Hand Lateral Pinch 10 lbs    Left Hand 3 Point Pinch 11 lbs      Left Hand AROM   L Little  MCP 0-90 100 Degrees    L Little PIP 0-100 80 Degrees   -21   L Little DIP 0-70 60 Degrees              Patient arrives after being out of town and doing home exercises for 3 weeks. Patient showed increase flexion at PIP as well as MCP while also improving PIP extension. Edema improved greatly. Increased grip and three-point pinch          OT Treatments/Exercises (OP) - 05/06/22 0001       LUE Paraffin   Number Minutes Paraffin 8 Minutes    LUE Paraffin Location Hand    Comments LMB splint for PIP extention - prior to PROM and soft tissue             Focus on PIP extension this date.  Prior prior to range of motion done [employer] #2 for sweeping and brushing over volar fifth digit and palm. Joint mobs to PIP - resulting in decrease pain and increase ease tolerating PROM  Able to get full 0 degree PIP extension Patient to continue with rubber band for PIP extension blocking MC at 0 degrees.  Can do place and hold with MC flexion blocked with 0-placed on hold PIP extension 5 repetitions 4 sets with passive range of motion between if needed   Patient alternating during the day working on extension and flexion. Reinforced with patient that she should be able to touch her palm with her passive range of motion composite flexion for fifth during the day to be able to use it functionally.  Able to get patient composite fist with place and hold touching palm in session. Passive range of motion in PIP improved sensation to 90 but patient has to block MC at 90-100.   Upgrade to medium firm green putty for gripping 10 reps with in between rolling for extension.gripping and rolling 2 sets of 10 or 12 pain-free Add small   ball of green putty -for endrange pinching with  4th  /5th digits 10 reps -     Patient to follow-up with me in 6 to 8 weeks.           OT Education - 05/06/22 1135     Education Details exercises, ROM    Person(s) Educated Patient    Methods Explanation;Demonstration;Verbal  cues;Handout    Comprehension Verbalized understanding;Returned demonstration;Verbal cues required              OT Short Term Goals - 05/06/22 1351       OT SHORT TERM GOAL #1   Title 1. Patient to be independent in home program to decrease edema and decrease scar tissue to increase motion in left fifth digit    Baseline Stitches in place, edema - has coban on  -AROM 80MC, PIP 75 and ext -40 PIP, cont to upgrade HEP as pt progresses - progress the last 3 wks wtih HEP    Status Achieved               OT Long Term Goals - 05/06/22 1352       OT LONG TERM GOAL #1   Title Patient left fifth digit flexion active range of motion increase for patient to touch palm to drive and squeeze washcloth    Baseline MC80, PIP 75, AROM  NOW progressing - can touch palm in session- MC 100 and PIP 80 , DIP 60 - extnetion of PIP -21    Time 1    Period Weeks    Status On-going    Target Date 05/15/22      OT LONG TERM GOAL #2   Title 2.  Patient able to make composite fist with left fifth digit to squeeze washcloth with no increase symptoms    Status Achieved      OT LONG TERM GOAL #3   Title 3.  Patient's left grip strength increased to more than 75% compared to right hand to return to crafts and art activities without increase symptoms    Baseline grip and prehension strenght increasing - but not doing all crafts and art activities    Time 1    Period Weeks    Status On-going    Target Date 05/15/22                   Plan - 05/06/22 1136     Clinical Impression Statement Patient s/p with left fifth ORIF of proximal phalanges on 11/14/21-patient was seen for therapy for several weeks with limited progress in endrange of flexion and extension  limited by hardware.  Patient had hardware removed on 02/04/2022 and referred by orthopedist to OT for 3 times a week for 3 weeks to initiate early passive range of motion-active range of motion to increase flexion and extension at L fifth digit.  Patient is 3 months post hardware removal.  Pt done HEP for 3 wks and showed increase PIP flexion with hyuper flexion out of MC - pt to block MC at 90-100 but prevent further flexion to put force to PIP - able to touch palm in session. Did show increase PIP extention with HEP - and increase gripstrength. Upgrade her putty to med [employer] green - and pt to cont with HEP for 6-8 wks and follow up if needed. Great improvement in edema. Patient to continue with rubber band for PIP DIP extension strengthening.  While advancing flexion..  Patient can continue to benefit from OT services increase range of motion and strength to return to previous level of function..    OT Occupational Profile and History Problem Focused Assessment - Including review of records relating to presenting problem    Occupational performance deficits (Please refer to evaluation for details): ADL's;IADL's;Leisure    Body Structure / Function / Physical Skills ADL;Scar mobility;UE functional use;IADL;Pain;Dexterity;FMC;ROM;Edema  Psychosocial Skills Habits;Environmental  Adaptations;Routines and Behaviors    Rehab Potential Good    Clinical Decision Making Limited treatment options, no task modification necessary    Comorbidities Affecting Occupational Performance: None    Modification or Assistance to Complete Evaluation  No modification of tasks or assist necessary to complete eval    OT Frequency Monthly    OT Duration 6 weeks    OT Treatment/Interventions Self-care/ADL training;Cryotherapy;Paraffin;Therapeutic exercise;Fluidtherapy;Neuromuscular education;Manual Therapy;Scar mobilization;Splinting;Moist Heat;Contrast Bath;Energy conservation;Passive range of motion;Therapeutic  activities;Patient/family education    Consulted and Agree with Plan of Care Patient             Patient will benefit from skilled therapeutic intervention in order to improve the following deficits and impairments:   Body Structure / Function / Physical Skills: ADL, Scar mobility, UE functional use, IADL, Pain, Dexterity, FMC, ROM, Edema   Psychosocial Skills: Habits, Environmental  Adaptations, Routines and Behaviors   Visit Diagnosis: Pain in left hand  Scar tissue  Muscle weakness (generalized)  Stiffness of left hand, not elsewhere classified    Problem List Patient Active Problem List   Diagnosis Date Noted   Vaginal atrophy 02/07/2016   Vulvitis 02/07/2016   Menopause 02/07/2016   Osteopenia 02/07/2016   HPV test positive 02/07/2016   Essential hypertension 02/07/2016   Diverticulosis 02/07/2016    Rosalyn Gess, OTR/L,CLT 05/06/2022, 1:55 PM  [facility] [address] [place], [address] Phone: [phone]   Fax:  [phone]  Name: SHAKINA CHOY MRN: [id number] Date of Birth: 29-Mar-1946

## 2022-05-26 DIAGNOSIS — E782 Mixed hyperlipidemia: Secondary | ICD-10-CM | POA: Diagnosis not present

## 2022-05-26 DIAGNOSIS — M818 Other osteoporosis without current pathological fracture: Secondary | ICD-10-CM | POA: Diagnosis not present

## 2022-06-02 DIAGNOSIS — E782 Mixed hyperlipidemia: Secondary | ICD-10-CM | POA: Diagnosis not present

## 2022-06-02 DIAGNOSIS — K2100 Gastro-esophageal reflux disease with esophagitis, without bleeding: Secondary | ICD-10-CM | POA: Diagnosis not present

## 2022-06-02 DIAGNOSIS — M818 Other osteoporosis without current pathological fracture: Secondary | ICD-10-CM | POA: Diagnosis not present

## 2022-06-02 DIAGNOSIS — R739 Hyperglycemia, unspecified: Secondary | ICD-10-CM | POA: Diagnosis not present

## 2022-06-02 DIAGNOSIS — Z0000 Encounter for general adult medical examination without abnormal findings: Secondary | ICD-10-CM | POA: Diagnosis not present

## 2022-06-02 DIAGNOSIS — R011 Cardiac murmur, unspecified: Secondary | ICD-10-CM | POA: Diagnosis not present

## 2022-06-04 DIAGNOSIS — R011 Cardiac murmur, unspecified: Secondary | ICD-10-CM | POA: Diagnosis not present

## 2022-07-01 DIAGNOSIS — M1612 Unilateral primary osteoarthritis, left hip: Secondary | ICD-10-CM | POA: Diagnosis not present

## 2022-07-01 DIAGNOSIS — M25552 Pain in left hip: Secondary | ICD-10-CM | POA: Diagnosis not present

## 2022-07-01 DIAGNOSIS — M25551 Pain in right hip: Secondary | ICD-10-CM | POA: Diagnosis not present

## 2022-07-08 DIAGNOSIS — X32XXXA Exposure to sunlight, initial encounter: Secondary | ICD-10-CM | POA: Diagnosis not present

## 2022-07-08 DIAGNOSIS — Z85828 Personal history of other malignant neoplasm of skin: Secondary | ICD-10-CM | POA: Diagnosis not present

## 2022-07-08 DIAGNOSIS — D2271 Melanocytic nevi of right lower limb, including hip: Secondary | ICD-10-CM | POA: Diagnosis not present

## 2022-07-08 DIAGNOSIS — B070 Plantar wart: Secondary | ICD-10-CM | POA: Diagnosis not present

## 2022-07-08 DIAGNOSIS — D2261 Melanocytic nevi of right upper limb, including shoulder: Secondary | ICD-10-CM | POA: Diagnosis not present

## 2022-07-08 DIAGNOSIS — D2262 Melanocytic nevi of left upper limb, including shoulder: Secondary | ICD-10-CM | POA: Diagnosis not present

## 2022-07-08 DIAGNOSIS — L570 Actinic keratosis: Secondary | ICD-10-CM | POA: Diagnosis not present

## 2022-07-09 DIAGNOSIS — M810 Age-related osteoporosis without current pathological fracture: Secondary | ICD-10-CM | POA: Diagnosis not present

## 2022-07-22 ENCOUNTER — Other Ambulatory Visit: Payer: Self-pay | Admitting: Orthopedic Surgery

## 2022-07-28 DIAGNOSIS — M810 Age-related osteoporosis without current pathological fracture: Secondary | ICD-10-CM | POA: Diagnosis not present

## 2022-07-29 DIAGNOSIS — M1612 Unilateral primary osteoarthritis, left hip: Secondary | ICD-10-CM | POA: Diagnosis not present

## 2022-07-30 ENCOUNTER — Encounter
Admission: RE | Admit: 2022-07-30 | Discharge: 2022-07-30 | Disposition: A | Discharge status: Home / Self Care | Payer: PPO | Source: Ambulatory Visit | Attending: Orthopedic Surgery | Admitting: Orthopedic Surgery

## 2022-07-30 VITALS — BP 174/76 | HR 84 | Resp 14 | Ht 62.0 in | Wt 141.3 lb

## 2022-07-30 DIAGNOSIS — Z01810 Encounter for preprocedural cardiovascular examination: Secondary | ICD-10-CM | POA: Diagnosis not present

## 2022-07-30 DIAGNOSIS — Z01812 Encounter for preprocedural laboratory examination: Secondary | ICD-10-CM

## 2022-07-30 DIAGNOSIS — Z01818 Encounter for other preprocedural examination: Secondary | ICD-10-CM | POA: Diagnosis not present

## 2022-07-30 LAB — TYPE AND SCREEN
ABO/RH(D): O POS
Antibody Screen: NEGATIVE

## 2022-07-30 LAB — URINALYSIS, ROUTINE W REFLEX MICROSCOPIC
Bacteria, UA: NONE SEEN
Bilirubin Urine: NEGATIVE
Glucose, UA: NEGATIVE mg/dL
Hgb urine dipstick: NEGATIVE
Ketones, ur: NEGATIVE mg/dL
Nitrite: NEGATIVE
Protein, ur: NEGATIVE mg/dL
Specific Gravity, Urine: 1.002 — ABNORMAL LOW (ref 1.005–1.030)
pH: 6 (ref 5.0–8.0)

## 2022-07-30 LAB — SURGICAL PCR SCREEN
MRSA, PCR: NEGATIVE
Staphylococcus aureus: NEGATIVE

## 2022-07-30 NOTE — Patient Instructions (Addendum)
Your procedure is scheduled on:08-07-22 Thursday Report to the Registration Desk on the 1st floor of the [facility].Then proceed to the 2nd floor Surgery Desk To find out your arrival time, please call [phone] between 1PM - 3PM on:08-06-22 Wednesday If your arrival time is 6:00 am, do not arrive prior to that time as the [facility] entrance doors do not open until 6:00 am.  REMEMBER: Instructions that are not followed completely may result in serious medical risk, up to and including death; or upon the discretion of your surgeon and anesthesiologist your surgery may need to be rescheduled.  Do not eat food after midnight the night before surgery.  No gum chewing, lozengers or hard candies.  You may however, drink CLEAR liquids up to 2 hours before you are scheduled to arrive for your surgery. Do not drink anything within 2 hours of your scheduled arrival time.  Clear liquids include: - water  - apple juice without pulp - gatorade (not RED colors) - black coffee or tea (Do NOT add milk or creamers to the coffee or tea) Do NOT drink anything that is not on this list.  In addition, your doctor has ordered for you to drink the provided  Ensure Pre-Surgery Clear Carbohydrate Drink  Drinking this carbohydrate drink up to two hours before surgery helps to reduce insulin resistance and improve patient outcomes. Please complete drinking 2 hours prior to scheduled arrival time.  TAKE THESE MEDICATIONS THE MORNING OF SURGERY WITH A SIP OF WATER: -pantoprazole (PROTONIX)  -loratadine (CLARITIN)  -minoxidil (LONITEN)   One week prior to surgery: Stop Anti-inflammatories (NSAIDS) such as Advil, Aleve, Ibuprofen, Motrin, Naproxen, Naprosyn and Aspirin based products such as Excedrin, Goodys Powder, BC Powder.You may however, continue to take Tylenol if needed for pain up until the day of surgery.  Stop ANY OVER THE COUNTER supplements/vitamins 7 days prior to surgery (Biotin, Viactiv  Calcium Immune, Vitamin D3, Probiotic, Turmeric and Vitamin C)  No Alcohol for 24 hours before or after surgery.  No Smoking including e-cigarettes for 24 hours prior to surgery.  No chewable tobacco products for at least 6 hours prior to surgery.  No nicotine patches on the day of surgery.  Do not use any "recreational" drugs for at least a week prior to your surgery.  Please be advised that the combination of cocaine and anesthesia may have negative outcomes, up to and including death. If you test positive for cocaine, your surgery will be cancelled.  On the morning of surgery brush your teeth with toothpaste and water, you may rinse your mouth with mouthwash if you wish. Do not swallow any toothpaste or mouthwash.  Use CHG Soap as directed on instruction sheet.  Do not wear jewelry, make-up, hairpins, clips or nail polish.  Do not wear lotions, powders, or perfumes.   Do not shave body from the neck down 48 hours prior to surgery just in case you cut yourself which could leave a site for infection.  Also, freshly shaved skin may become irritated if using the CHG soap.  Contact lenses, hearing aids and dentures may not be worn into surgery.  Do not bring valuables to the hospital. [facility] is not responsible for any missing/lost belongings or valuables.   Notify your doctor if there is any change in your medical condition (cold, fever, infection).  Wear comfortable clothing (specific to your surgery type) to the hospital.  After surgery, you can help prevent lung complications by doing breathing exercises.  Take deep breaths and cough every 1-2 hours. Your doctor may order a device called an Incentive Spirometer to help you take deep breaths. When coughing or sneezing, hold a pillow firmly against your incision with both hands. This is called "splinting." Doing this helps protect your incision. It also decreases belly discomfort.  If you are being admitted to the hospital  overnight, leave your suitcase in the car. After surgery it may be brought to your room.  If you are being discharged the day of surgery, you will not be allowed to drive home. You will need a responsible adult (18 years or older) to drive you home and stay with you that night.   If you are taking public transportation, you will need to have a responsible adult (18 years or older) with you. Please confirm with your physician that it is acceptable to use public transportation.   Please call the [facility] at [phone] if you have any questions about these instructions.  Surgery Visitation Policy:  Patients undergoing a surgery or procedure may have two family members or support persons with them as long as the person is not COVID-19 positive or experiencing its symptoms.   Inpatient Visitation:    Visiting hours are 7 a.m. to 8 p.m. Up to four visitors are allowed at one time in a patient room. The visitors may rotate out with other people during the day. One designated support person (adult) may remain overnight.  MASKING: Due to an increase in RSV rates and hospitalizations, starting Wednesday, Nov. 15, in patient care areas in which we serve newborns, infants and children, masks will be required for teammates and visitors.  Children ages 90 and under may not visit. This policy affects the following departments only:  [facility] Post[facility] area Mother Baby Unit Newborn nursery/Special care nursery  Other areas: Masks continue to be strongly recommended for [facility] teammates, visitors and patients in all other areas. Visitation is not restricted outside of the units listed above.   How to Use an Incentive Spirometer An incentive spirometer is a tool that measures how well you are filling your lungs with each breath. Learning to take long, deep breaths using this tool can help you keep your lungs clear and active. This may help to  reverse or lessen your chance of developing breathing (pulmonary) problems, especially infection. You may be asked to use a spirometer: After a surgery. If you have a lung problem or a history of smoking. After a long period of time when you have been unable to move or be active. If the spirometer includes an indicator to show the highest number that you have reached, your health care provider or respiratory therapist will help you set a goal. Keep a log of your progress as told by your health care provider. What are the risks? Breathing too quickly may cause dizziness or cause you to pass out. Take your time so you do not get dizzy or light-headed. If you are in pain, you may need to take pain medicine before doing incentive spirometry. It is harder to take a deep breath if you are having pain. How to use your incentive spirometer  Sit up on the edge of your bed or on a chair. Hold the incentive spirometer so that it is in an upright position. Before you use the spirometer, breathe out normally. Place the mouthpiece in your mouth. Make sure your lips are closed tightly around it. Breathe in slowly  and as deeply as you can through your mouth, causing the piston or the ball to rise toward the top of the chamber. Hold your breath for 3-5 seconds, or for as long as possible. If the spirometer includes a coach indicator, use this to guide you in breathing. Slow down your breathing if the indicator goes above the marked areas. Remove the mouthpiece from your mouth and breathe out normally. The piston or ball will return to the bottom of the chamber. Rest for a few seconds, then repeat the steps 10 or more times. Take your time and take a few normal breaths between deep breaths so that you do not get dizzy or light-headed. Do this every 1-2 hours when you are awake. If the spirometer includes a goal marker to show the highest number you have reached (best effort), use this as a goal to work toward  during each repetition. After each set of 10 deep breaths, cough a few times. This will help to make sure that your lungs are clear. If you have an incision on your chest or abdomen from surgery, place a pillow or a rolled-up towel firmly against the incision when you cough. This can help to reduce pain while taking deep breaths and coughing. General tips When you are able to get out of bed: Walk around often. Continue to take deep breaths and cough in order to clear your lungs. Keep using the incentive spirometer until your health care provider says it is okay to stop using it. If you have been in the hospital, you may be told to keep using the spirometer at home. Contact a health care provider if: You are having difficulty using the spirometer. You have trouble using the spirometer as often as instructed. Your pain medicine is not giving enough relief for you to use the spirometer as told. You have a fever. Get help right away if: You develop shortness of breath. You develop a cough with bloody mucus from the lungs. You have fluid or blood coming from an incision site after you cough. Summary An incentive spirometer is a tool that can help you learn to take long, deep breaths to keep your lungs clear and active. You may be asked to use a spirometer after a surgery, if you have a lung problem or a history of smoking, or if you have been inactive for a long period of time. Use your incentive spirometer as instructed every 1-2 hours while you are awake. If you have an incision on your chest or abdomen, place a pillow or a rolled-up towel firmly against your incision when you cough. This will help to reduce pain. Get help right away if you have shortness of breath, you cough up bloody mucus, or blood comes from your incision when you cough. This information is not intended to replace advice given to you by your health care provider. Make sure you discuss any questions you have with your health  care provider. Document Revised: 11/14/2019 Document Reviewed: 11/14/2019 Elsevier Patient Education  [place].

## 2022-08-07 ENCOUNTER — Ambulatory Visit: Payer: PPO | Admitting: Anesthesiology

## 2022-08-07 ENCOUNTER — Ambulatory Visit: Payer: PPO

## 2022-08-07 ENCOUNTER — Encounter: Payer: Self-pay | Admitting: Orthopedic Surgery

## 2022-08-07 ENCOUNTER — Other Ambulatory Visit: Payer: Self-pay

## 2022-08-07 ENCOUNTER — Other Ambulatory Visit: Payer: Self-pay | Admitting: Radiology

## 2022-08-07 ENCOUNTER — Observation Stay
Admission: RE | Admit: 2022-08-07 | Discharge: 2022-08-08 | Disposition: A | Discharge status: Home Health | Payer: PPO | Attending: Orthopedic Surgery | Admitting: Orthopedic Surgery

## 2022-08-07 ENCOUNTER — Encounter
Admission: RE | Disposition: A | Discharge status: Home Health | Payer: Self-pay | Source: Home / Self Care | Attending: Orthopedic Surgery

## 2022-08-07 ENCOUNTER — Ambulatory Visit: Payer: PPO | Admitting: Urgent Care

## 2022-08-07 DIAGNOSIS — Z471 Aftercare following joint replacement surgery: Secondary | ICD-10-CM | POA: Diagnosis not present

## 2022-08-07 DIAGNOSIS — Z79899 Other long term (current) drug therapy: Secondary | ICD-10-CM | POA: Diagnosis not present

## 2022-08-07 DIAGNOSIS — M1612 Unilateral primary osteoarthritis, left hip: Secondary | ICD-10-CM | POA: Diagnosis not present

## 2022-08-07 DIAGNOSIS — Z96642 Presence of left artificial hip joint: Secondary | ICD-10-CM | POA: Diagnosis not present

## 2022-08-07 DIAGNOSIS — I10 Essential (primary) hypertension: Secondary | ICD-10-CM | POA: Insufficient documentation

## 2022-08-07 DIAGNOSIS — E785 Hyperlipidemia, unspecified: Secondary | ICD-10-CM | POA: Diagnosis not present

## 2022-08-07 HISTORY — PX: TOTAL HIP ARTHROPLASTY: SHX124

## 2022-08-07 LAB — ABO/RH: ABO/RH(D): O POS

## 2022-08-07 SURGERY — ARTHROPLASTY, HIP, TOTAL, ANTERIOR APPROACH
Anesthesia: Spinal | Site: Hip | Laterality: Left

## 2022-08-07 MED ORDER — SODIUM CHLORIDE 0.9 % IR SOLN
Status: DC | PRN
  Administered 2022-08-07: 100 mL

## 2022-08-07 MED ORDER — ONDANSETRON HCL 4 MG/2ML IJ SOLN: 4.0000 mg | Freq: Once | INTRAMUSCULAR | Status: DC | PRN

## 2022-08-07 MED ORDER — TRAMADOL HCL 50 MG PO TABS
50.0000 mg | ORAL_TABLET | Freq: Four times a day (QID) | ORAL | Status: DC | PRN
  Administered 2022-08-07 – 2022-08-08 (×2): 50 mg via ORAL
  Filled 2022-08-07 (×2): qty 1

## 2022-08-07 MED ORDER — METOCLOPRAMIDE HCL 5 MG/ML IJ SOLN: 5.0000 mg | Freq: Three times a day (TID) | INTRAMUSCULAR | Status: DC | PRN

## 2022-08-07 MED ORDER — PANTOPRAZOLE SODIUM 40 MG PO TBEC
40.0000 mg | DELAYED_RELEASE_TABLET | Freq: Two times a day (BID) | ORAL | Status: DC
  Administered 2022-08-07 – 2022-08-08 (×2): 40 mg via ORAL
  Filled 2022-08-07 (×2): qty 1

## 2022-08-07 MED ORDER — PHENYLEPHRINE HCL-NACL 20-0.9 MG/250ML-% IV SOLN
INTRAVENOUS | Status: DC | PRN
  Administered 2022-08-07: 30 ug/min via INTRAVENOUS

## 2022-08-07 MED ORDER — CEFAZOLIN SODIUM-DEXTROSE 2-4 GM/100ML-% IV SOLN
2.0000 g | Freq: Four times a day (QID) | INTRAVENOUS | Status: AC
  Administered 2022-08-07: 2 g via INTRAVENOUS
  Filled 2022-08-07 (×2): qty 100

## 2022-08-07 MED ORDER — PROPOFOL 1000 MG/100ML IV EMUL
INTRAVENOUS | Status: AC
  Filled 2022-08-07: qty 100

## 2022-08-07 MED ORDER — PROPOFOL 10 MG/ML IV BOLUS
INTRAVENOUS | Status: AC
  Filled 2022-08-07: qty 20

## 2022-08-07 MED ORDER — ONDANSETRON HCL 4 MG PO TABS: 4.0000 mg | ORAL_TABLET | Freq: Four times a day (QID) | ORAL | Status: DC | PRN

## 2022-08-07 MED ORDER — DEXAMETHASONE SODIUM PHOSPHATE 10 MG/ML IJ SOLN
INTRAMUSCULAR | Status: AC
  Filled 2022-08-07: qty 1

## 2022-08-07 MED ORDER — IRRISEPT - 450ML BOTTLE WITH 0.05% CHG IN STERILE WATER, USP 99.95% OPTIME
TOPICAL | Status: DC | PRN
  Administered 2022-08-07: 450 mL

## 2022-08-07 MED ORDER — ORAL CARE MOUTH RINSE: 15.0000 mL | Freq: Once | OROMUCOSAL | Status: AC

## 2022-08-07 MED ORDER — SODIUM CHLORIDE FLUSH 0.9 % IV SOLN
INTRAVENOUS | Status: AC
  Filled 2022-08-07: qty 30

## 2022-08-07 MED ORDER — SODIUM CHLORIDE 0.9 % IV SOLN: INTRAVENOUS | Status: DC

## 2022-08-07 MED ORDER — HYDROCODONE-ACETAMINOPHEN 5-325 MG PO TABS
1.0000 | ORAL_TABLET | ORAL | Status: DC | PRN
  Administered 2022-08-07: 2 via ORAL
  Filled 2022-08-07 (×2): qty 2

## 2022-08-07 MED ORDER — DEXAMETHASONE SODIUM PHOSPHATE 10 MG/ML IJ SOLN: 8.0000 mg | Freq: Once | INTRAMUSCULAR | Status: DC

## 2022-08-07 MED ORDER — ONDANSETRON HCL 4 MG/2ML IJ SOLN: 4.0000 mg | Freq: Four times a day (QID) | INTRAMUSCULAR | Status: DC | PRN

## 2022-08-07 MED ORDER — SODIUM CHLORIDE (PF) 0.9 % IJ SOLN
INTRAMUSCULAR | Status: DC | PRN
  Administered 2022-08-07: 50 mL via INTRAMUSCULAR

## 2022-08-07 MED ORDER — BUPIVACAINE LIPOSOME 1.3 % IJ SUSP
INTRAMUSCULAR | Status: AC
  Filled 2022-08-07: qty 40

## 2022-08-07 MED ORDER — DOCUSATE SODIUM 100 MG PO CAPS
100.0000 mg | ORAL_CAPSULE | Freq: Two times a day (BID) | ORAL | Status: DC
  Administered 2022-08-07 – 2022-08-08 (×3): 100 mg via ORAL
  Filled 2022-08-07 (×3): qty 1

## 2022-08-07 MED ORDER — PHENOL 1.4 % MT LIQD: 1.0000 | OROMUCOSAL | Status: DC | PRN

## 2022-08-07 MED ORDER — EPHEDRINE SULFATE (PRESSORS) 50 MG/ML IJ SOLN
INTRAMUSCULAR | Status: DC | PRN
  Administered 2022-08-07: 10 mg via INTRAVENOUS

## 2022-08-07 MED ORDER — METOCLOPRAMIDE HCL 5 MG PO TABS: 5.0000 mg | ORAL_TABLET | Freq: Three times a day (TID) | ORAL | Status: DC | PRN

## 2022-08-07 MED ORDER — BUPIVACAINE HCL (PF) 0.5 % IJ SOLN
INTRAMUSCULAR | Status: DC | PRN
  Administered 2022-08-07: 2.6 mL

## 2022-08-07 MED ORDER — FENTANYL CITRATE (PF) 100 MCG/2ML IJ SOLN: 25.0000 ug | INTRAMUSCULAR | Status: DC | PRN

## 2022-08-07 MED ORDER — CHLORHEXIDINE GLUCONATE 0.12 % MT SOLN: 15.0000 mL | Freq: Once | OROMUCOSAL | Status: AC

## 2022-08-07 MED ORDER — MINOXIDIL 2.5 MG PO TABS
1.2500 mg | ORAL_TABLET | Freq: Every day | ORAL | Status: DC
  Administered 2022-08-08: 1.25 mg via ORAL
  Filled 2022-08-07: qty 0.5

## 2022-08-07 MED ORDER — MIDAZOLAM HCL 2 MG/2ML IJ SOLN
INTRAMUSCULAR | Status: AC
  Filled 2022-08-07: qty 2

## 2022-08-07 MED ORDER — GABAPENTIN 100 MG PO CAPS
100.0000 mg | ORAL_CAPSULE | Freq: Every day | ORAL | Status: DC
  Administered 2022-08-07: 100 mg via ORAL
  Filled 2022-08-07: qty 1

## 2022-08-07 MED ORDER — FLUTICASONE PROPIONATE 50 MCG/ACT NA SUSP
2.0000 | Freq: Every day | NASAL | Status: DC
  Administered 2022-08-07: 2 via NASAL
  Filled 2022-08-07: qty 16

## 2022-08-07 MED ORDER — MIDAZOLAM HCL 5 MG/5ML IJ SOLN
INTRAMUSCULAR | Status: DC | PRN
  Administered 2022-08-07: 2 mg via INTRAVENOUS

## 2022-08-07 MED ORDER — LACTATED RINGERS IV SOLN: INTRAVENOUS | Status: DC

## 2022-08-07 MED ORDER — CEFAZOLIN SODIUM-DEXTROSE 2-4 GM/100ML-% IV SOLN
INTRAVENOUS | Status: AC
  Administered 2022-08-07: 2 g via INTRAVENOUS
  Filled 2022-08-07: qty 100

## 2022-08-07 MED ORDER — CHLORHEXIDINE GLUCONATE 0.12 % MT SOLN
OROMUCOSAL | Status: AC
  Administered 2022-08-07: 15 mL via OROMUCOSAL
  Filled 2022-08-07: qty 15

## 2022-08-07 MED ORDER — ENOXAPARIN SODIUM 40 MG/0.4ML IJ SOSY
40.0000 mg | PREFILLED_SYRINGE | INTRAMUSCULAR | Status: DC
  Administered 2022-08-08: 40 mg via SUBCUTANEOUS
  Filled 2022-08-07: qty 0.4

## 2022-08-07 MED ORDER — MORPHINE SULFATE (PF) 2 MG/ML IV SOLN
0.5000 mg | INTRAVENOUS | Status: DC | PRN
  Administered 2022-08-07: 1 mg via INTRAVENOUS
  Filled 2022-08-07: qty 1

## 2022-08-07 MED ORDER — ONDANSETRON HCL 4 MG/2ML IJ SOLN
INTRAMUSCULAR | Status: DC | PRN
  Administered 2022-08-07: 4 mg via INTRAVENOUS

## 2022-08-07 MED ORDER — ACETAMINOPHEN 500 MG PO TABS
1000.0000 mg | ORAL_TABLET | Freq: Three times a day (TID) | ORAL | Status: DC
  Administered 2022-08-07 – 2022-08-08 (×3): 1000 mg via ORAL
  Filled 2022-08-07 (×4): qty 2

## 2022-08-07 MED ORDER — TRANEXAMIC ACID-NACL 1000-0.7 MG/100ML-% IV SOLN
INTRAVENOUS | Status: AC
  Filled 2022-08-07: qty 100

## 2022-08-07 MED ORDER — ACETAMINOPHEN 10 MG/ML IV SOLN
INTRAVENOUS | Status: AC
  Filled 2022-08-07: qty 100

## 2022-08-07 MED ORDER — DEXAMETHASONE SODIUM PHOSPHATE 4 MG/ML IJ SOLN
INTRAMUSCULAR | Status: DC | PRN
  Administered 2022-08-07: 8 mg via INTRAVENOUS

## 2022-08-07 MED ORDER — PROPOFOL 500 MG/50ML IV EMUL
INTRAVENOUS | Status: DC | PRN
  Administered 2022-08-07: 200 ug/kg/min via INTRAVENOUS

## 2022-08-07 MED ORDER — CEFAZOLIN SODIUM-DEXTROSE 2-4 GM/100ML-% IV SOLN
2.0000 g | INTRAVENOUS | Status: AC
  Administered 2022-08-07: 2 g via INTRAVENOUS

## 2022-08-07 MED ORDER — BUPIVACAINE-EPINEPHRINE (PF) 0.25% -1:200000 IJ SOLN
INTRAMUSCULAR | Status: AC
  Filled 2022-08-07: qty 60

## 2022-08-07 MED ORDER — MENTHOL 3 MG MT LOZG: 1.0000 | LOZENGE | OROMUCOSAL | Status: DC | PRN

## 2022-08-07 MED ORDER — TRANEXAMIC ACID-NACL 1000-0.7 MG/100ML-% IV SOLN
1000.0000 mg | INTRAVENOUS | Status: AC
  Administered 2022-08-07: 1000 mg via INTRAVENOUS

## 2022-08-07 MED ORDER — ACETAMINOPHEN 10 MG/ML IV SOLN
INTRAVENOUS | Status: DC | PRN
  Administered 2022-08-07: 1000 mg via INTRAVENOUS

## 2022-08-07 SURGICAL SUPPLY — 64 items
BLADE SAGITTAL AGGR TOOTH XLG (BLADE) ×1 IMPLANT
BNDG COHESIVE 6X5 TAN ST LF (GAUZE/BANDAGES/DRESSINGS) ×1 IMPLANT
CHLORAPREP W/TINT 26 (MISCELLANEOUS) ×1 IMPLANT
COVER BACK TABLE REUSABLE LG (DRAPES) ×1 IMPLANT
DERMABOND ADVANCED .7 DNX12 (GAUZE/BANDAGES/DRESSINGS) ×1 IMPLANT
DRAPE 3/4 80X56 (DRAPES) ×1 IMPLANT
DRAPE C-ARM XRAY 36X54 (DRAPES) ×1 IMPLANT
DRAPE POUCH INSTRU U-SHP 10X18 (DRAPES) ×1 IMPLANT
DRSG MEPILEX SACRM 8.7X9.8 (GAUZE/BANDAGES/DRESSINGS) ×1 IMPLANT
DRSG OPSITE POSTOP 4X8 (GAUZE/BANDAGES/DRESSINGS) ×1 IMPLANT
ELECT BLADE 4.0 EZ CLEAN MEGAD (MISCELLANEOUS)
ELECT REM PT RETURN 9FT ADLT (ELECTROSURGICAL) ×1
ELECTRODE BLDE 4.0 EZ CLN MEGD (MISCELLANEOUS) ×1 IMPLANT
ELECTRODE REM PT RTRN 9FT ADLT (ELECTROSURGICAL) ×1 IMPLANT
GLOVE BIO SURGEON STRL SZ8 (GLOVE) ×1 IMPLANT
GLOVE BIOGEL PI IND STRL 8 (GLOVE) ×1 IMPLANT
GLOVE PI ORTHO PRO STRL 7.5 (GLOVE) ×2 IMPLANT
GLOVE PI ORTHO PRO STRL SZ8 (GLOVE) ×2 IMPLANT
GLOVE SURG SYN 7.5  E (GLOVE) ×1
GLOVE SURG SYN 7.5 E (GLOVE) ×1 IMPLANT
GLOVE SURG SYN 7.5 PF PI (GLOVE) ×1 IMPLANT
GOWN STRL REUS W/ TWL LRG LVL3 (GOWN DISPOSABLE) ×1 IMPLANT
GOWN STRL REUS W/ TWL XL LVL3 (GOWN DISPOSABLE) ×2 IMPLANT
GOWN STRL REUS W/TWL LRG LVL3 (GOWN DISPOSABLE) ×1
GOWN STRL REUS W/TWL XL LVL3 (GOWN DISPOSABLE) ×2
HEAD CERAMIC FEMORAL 36MM (Head) IMPLANT
HOOD PEEL AWAY T7 (MISCELLANEOUS) ×2 IMPLANT
INSERT 0 DEGREE 36 (Miscellaneous) IMPLANT
IV NS 100ML SINGLE PACK (IV SOLUTION) ×1 IMPLANT
JET LAVAGE IRRISEPT WOUND (IRRIGATION / IRRIGATOR) ×1
KIT PATIENT CARE HANA TABLE (KITS) ×1 IMPLANT
LAVAGE JET IRRISEPT WOUND (IRRIGATION / IRRIGATOR) IMPLANT
LIGHT WAVEGUIDE WIDE FLAT (MISCELLANEOUS) ×1 IMPLANT
MANIFOLD NEPTUNE II (INSTRUMENTS) ×1 IMPLANT
MAT ABSORB  FLUID 56X50 GRAY (MISCELLANEOUS) ×1
MAT ABSORB FLUID 56X50 GRAY (MISCELLANEOUS) ×1 IMPLANT
NDL SPNL 20GX3.5 QUINCKE YW (NEEDLE) ×1 IMPLANT
NEEDLE SPNL 20GX3.5 QUINCKE YW (NEEDLE) ×1 IMPLANT
NS IRRIG 500ML POUR BTL (IV SOLUTION) ×1 IMPLANT
PACK HIP COMPR (MISCELLANEOUS) ×1 IMPLANT
SCREW HEX LP 6.5X30 (Screw) IMPLANT
SHELL TRIDENT II CLUST 50 (Shell) IMPLANT
SLEEVE SCD COMPRESS KNEE MED (STOCKING) ×1 IMPLANT
SOLUTION IRRIG SURGIPHOR (IV SOLUTION) ×1 IMPLANT
STEM HIGH OFFSET SZ4 36X103 (Stem) IMPLANT
SURGIFLO W/THROMBIN 8M KIT (HEMOSTASIS) IMPLANT
SUT BONE WAX W31G (SUTURE) ×1 IMPLANT
SUT DVC 2 QUILL PDO  T11 36X36 (SUTURE) ×1
SUT DVC 2 QUILL PDO T11 36X36 (SUTURE) ×1 IMPLANT
SUT ETHIBOND 2 V 37 (SUTURE) ×1 IMPLANT
SUT QUILL MONODERM 3-0 PS-2 (SUTURE) ×1 IMPLANT
SUT QUILL PDO 2 24X24 VLT (SUTURE) ×1 IMPLANT
SUT SILK 0 (SUTURE) ×1
SUT SILK 0 30XBRD TIE 6 (SUTURE) ×1 IMPLANT
SUT VIC AB 0 CT1 36 (SUTURE) ×1 IMPLANT
SUT VIC AB 1 CT1 36 (SUTURE) ×1 IMPLANT
SUT VIC AB 2-0 CT2 27 (SUTURE) ×2 IMPLANT
SYR 30ML LL (SYRINGE) ×2 IMPLANT
TAPE MICROFOAM 4IN (TAPE) IMPLANT
TOWEL OR 17X26 4PK STRL BLUE (TOWEL DISPOSABLE) IMPLANT
TRAP FLUID SMOKE EVACUATOR (MISCELLANEOUS) ×1 IMPLANT
TUBE KAMVAC SUCTION (TUBING) ×1 IMPLANT
WAND WEREWOLF FASTSEAL 6.0 (MISCELLANEOUS) ×1 IMPLANT
WATER STERILE IRR 1000ML POUR (IV SOLUTION) ×1 IMPLANT

## 2022-08-07 NOTE — Interval H&P Note (Signed)
History and Physical Interval Note:  08/07/2022 7:32 AM  Christina Foley  has presented today for surgery, with the diagnosis of Osteoarthritis of left hip, unspecified osteoarthritis type M16.12.  The various methods of treatment have been discussed with the patient and family. After consideration of risks, benefits and other options for treatment, the patient has consented to  Procedure(s): TOTAL HIP ARTHROPLASTY ANTERIOR APPROACH (Left) as a surgical intervention.  The patient's history has been reviewed, patient examined, no change in status, stable for surgery.  I have reviewed the patient's chart and labs.  Questions were answered to the patient's satisfaction.     Steffanie Rainwater

## 2022-08-07 NOTE — Transfer of Care (Signed)
Immediate Anesthesia Transfer of Care Note  Patient: Christina Foley  Procedure(s) Performed: TOTAL HIP ARTHROPLASTY ANTERIOR APPROACH (Left: Hip)  Patient Location: PACU  Anesthesia Type:Spinal  Level of Consciousness: awake, alert , and oriented  Airway & Oxygen Therapy: Patient Spontanous Breathing  Post-op Assessment: Report given to RN and Post -op Vital signs reviewed and stable  Post vital signs: Reviewed and stable  Last Vitals:  Vitals Value Taken Time  BP    Temp    Pulse 73 08/07/22 0958  Resp 16 08/07/22 0958  SpO2 98 % 08/07/22 0958  Vitals shown include unvalidated device data.  Last Pain:  Vitals:   08/07/22 0625  TempSrc: Temporal  PainSc: 0-No pain         Complications: No notable events documented.

## 2022-08-07 NOTE — Anesthesia Preprocedure Evaluation (Signed)
Anesthesia Evaluation  Patient identified by MRN, date of birth, ID band Patient awake    Reviewed: Allergy & Precautions, H&P , NPO status , Patient's Chart, lab work & pertinent test results, reviewed documented beta blocker date and time   Airway Mallampati: II   Neck ROM: full    Dental  (+) Poor Dentition   Pulmonary pneumonia, resolved   Pulmonary exam normal        Cardiovascular Exercise Tolerance: Good hypertension, On Medications Normal cardiovascular exam+ Valvular Problems/Murmurs  Rhythm:regular Rate:Normal     Neuro/Psych  PSYCHIATRIC DISORDERS      negative neurological ROS     GI/Hepatic Neg liver ROS,GERD  Medicated,,  Endo/Other  negative endocrine ROS    Renal/GU negative Renal ROS  negative genitourinary   Musculoskeletal   Abdominal   Peds  Hematology negative hematology ROS (+)   Anesthesia Other Findings Past Medical History: No date: Back pain No date: Cataract cortical, senile, left No date: DDD (degenerative disc disease), cervical No date: Decreased libido No date: Diverticulitis No date: Diverticulosis No date: Environmental allergies     Comment:  seasonal No date: GERD (gastroesophageal reflux disease) No date: Heart murmur     Comment:  1970s No date: HPV test positive No date: Hyperlipidemia No date: Hypertension No date: Menopausal and postmenopausal disorder No date: Menopause No date: Osteoarthritis No date: Osteopenia No date: Osteoporosis No date: Pneumonia No date: Postmenopausal atrophic vaginitis No date: Rectocele No date: Shoulder pain, right     Comment:  rotator cuff tear No date: Thyroid nodule Past Surgical History: 2000: BREAST CYST ASPIRATION; Bilateral     Comment:  benign No date: BUNIONECTOMY; Left No date: CATARACT EXTRACTION; Left No date: COLONOSCOPY     Comment:  1993, 2005, 2009 03/31/2018: COLONOSCOPY WITH PROPOFOL; N/A     Comment:   Procedure: COLONOSCOPY WITH PROPOFOL;  Surgeon: Manya Silvas, MD;  Location: [facility] ENDOSCOPY;  Service:               Endoscopy;  Laterality: N/A; No date: CRYOTHERAPY 1993: ESOPHAGOGASTRODUODENOSCOPY 05/24/2020: ESOPHAGOGASTRODUODENOSCOPY 03/31/2018: ESOPHAGOGASTRODUODENOSCOPY (EGD) WITH PROPOFOL; N/A     Comment:  Procedure: ESOPHAGOGASTRODUODENOSCOPY (EGD) WITH               PROPOFOL;  Surgeon: Manya Silvas, MD;  Location:               [facility] ENDOSCOPY;  Service: Endoscopy;  Laterality: N/A; 02/04/2022: HARDWARE REMOVAL; Left     Comment:  Procedure: Left little finger hardware removal;                Surgeon: Hessie Knows, MD;  Location: [facility] ORS;                Service: Orthopedics;  Laterality: Left; 11/14/2021: OPEN REDUCTION INTERNAL FIXATION (ORIF) PROXIMAL PHALANX;  Left     Comment:  Procedure: Left, little finger proximal phalanx ORIF;                Surgeon: Hessie Knows, MD;  Location: [facility] ORS;                Service: Orthopedics;  Laterality: Left;  left little               finger 10/28/2017: SHOULDER ARTHROSCOPY; Right     Comment:  Procedure: ARTHROSCOPY SHOULDER EITH DEBRIDEMENT  [place]is REAPIR OF ROTATOR CUFF REPAIR;                Surgeon: Corky Mull, MD;  Location: [place] [facility];  Service: Orthopedics;  Laterality: Right; 08/07/2014: TOTAL HIP ARTHROPLASTY; Right No date: TUBAL LIGATION BMI    Body Mass Index: 25.24 kg/m     Reproductive/Obstetrics negative OB ROS                             Anesthesia Physical Anesthesia Plan  ASA: 3  Anesthesia Plan: Spinal   Post-op Pain Management: Regional block*   Induction:   PONV Risk Score and Plan: 4 or greater  Airway Management Planned:   Additional Equipment:   Intra-op Plan:   Post-operative Plan:   Informed Consent: I have reviewed the patients History and Physical, chart, labs and discussed the  procedure including the risks, benefits and alternatives for the proposed anesthesia with the patient or authorized representative who has indicated his/her understanding and acceptance.     Dental Advisory Given  Plan Discussed with: CRNA  Anesthesia Plan Comments:        Anesthesia Quick Evaluation

## 2022-08-07 NOTE — Anesthesia Postprocedure Evaluation (Signed)
Anesthesia Post Note  Patient: Christina Foley  Procedure(s) Performed: TOTAL HIP ARTHROPLASTY ANTERIOR APPROACH (Left: Hip)  Patient location during evaluation: PACU Anesthesia Type: Spinal Level of consciousness: awake and alert Pain management: pain level controlled Vital Signs Assessment: post-procedure vital signs reviewed and stable Respiratory status: spontaneous breathing, nonlabored ventilation, respiratory function stable and patient connected to nasal cannula oxygen Cardiovascular status: blood pressure returned to baseline and stable Postop Assessment: no apparent nausea or vomiting Anesthetic complications: no   No notable events documented.   Last Vitals:  Vitals:   08/07/22 1030 08/07/22 1045  BP: (!) 145/67 (!) 133/59  Pulse: 84   Resp: 17   Temp:  (!) 36 C  SpO2: 98%     Last Pain:  Vitals:   08/07/22 1045  TempSrc:   PainSc: 0-No pain    LLE Motor Response: Purposeful movement;Responds to commands (08/07/22 1045) LLE Sensation: Tingling;No pain (08/07/22 1045) RLE Motor Response: Purposeful movement;Responds to commands (08/07/22 1045) RLE Sensation: Tingling;No pain (08/07/22 1045) L Sensory Level: L5-Outer lower leg, top of foot, great toe (08/07/22 1045) R Sensory Level: L5-Outer lower leg, top of foot, great toe (08/07/22 1045)  Molli Barrows

## 2022-08-07 NOTE — Plan of Care (Signed)

## 2022-08-07 NOTE — Anesthesia Procedure Notes (Signed)
Date/Time: 08/07/2022 7:45 AM  Performed by: Nelda Marseille, CRNAPre-anesthesia Checklist: Patient identified, Emergency Drugs available, Suction available, Patient being monitored and Timeout performed Oxygen Delivery Method: Simple face mask

## 2022-08-07 NOTE — Op Note (Signed)
Patient Name: Christina Foley  QVZ:[id number]  Pre-Operative Diagnosis: Left hip Osteoarthritis  Post-Operative Diagnosis: Left hip osteoarthritis  Procedure: Left Total Hip Arthroplasty  Components/Implants: Cup 72m Trident II Tritanium cluster hole    Liner: X3 Neutral 36/D  Stem Insignia High Offset size 4  Head: Biolox 362m+58m39mDate of Surgery: 08/07/2022  Surgeon: ZacSteffanie Rainwater  Assistant: ThoDorise Hiss (present and scrubbed throughout the case, critical for assistance with exposure, retraction, instrumentation, and closure)   Anesthesiologist: Adams  Anesthesia: Spinal  EBL: 100cc  IVF: [id number]: None   Brief history: The patient is a 76 45ar old female with a history of osteoarthritis of the left hip with pain limiting their range of motion and activities of daily living, which has failed multiple attempts at conservative therapy.  The risks and benefits of total hip arthroplasty as definitive surgical treatment were discussed with the patient, who opted to proceed with the operation.  After outpatient medical clearance and optimization was completed [facility]  All preoperative films were reviewed and an appropriate surgical plan was made prior to surgery.   Description of procedure: The patient was brought to the operating room where laterality was confirmed by all those present to be the left side.  The patient was administered spinal anesthesia on a stretcher prior to being moved supine on the operating room table. Patient was given an intravenous dose of antibiotics for surgical prophylaxis and TXA.  All bony prominences and extremities were well padded and the patient was securely attached to the table boots, a perineal post was placed and the patient had a safety strap placed.  Surgical site was prepped with alcohol and chlorhexidine. The surgical site over the hip was and  draped in typical sterile fashion with multiple layers of adhesive and nonadhesive drapes.  The incision site was marked out with a sterile marker and care was taken to assess the position of the ASIS and ensure appropriate position for the incision.    A surgical timeout was then called with participation of all staff in the room the patient was then a confirmed again and laterality confirmed.  Incision was made over the anterior lateral aspect of the proximal thigh in line with the TFL.  Appropriate retractors were placed and all bleeding vessels were coagulated within the subcutaneous and fatty layers.  An incision was made in the TFL fascia in the interval was carefully identified.  The lateral ascending branches of the circumflex vessels were identified, cauterized and carefully dissected.  Retractors were placed around the superior lateral and inferior medial aspects of the femoral neck and a capsulotomy was performed exposing the hip joint.  Retraction stitches were placed and the capsulotomy to assist with visualization.  Femoral neck cut was then made and the femoral head was extracted after placing the leg in traction.  Bone wax was then applied to the proximal cut surface of the femur and bipolar werewolf  was used to address any bleeding around the femoral neck cut.  Retractors were then placed around the acetabulum to fully visualize the joint space, and the remaining labral tissue was removed and pulvinar was removed.   The acetabulum was then sequentially reamed up to the appropriate size in order to get good fit and fill for the acetabular component while under fluoroscopic guidance.  Acetabular component was then placed and malleted into a secure fit while confirming position and abduction angle and  anteversion utilizing fluoroscopy.  1 screw were then placed in the acetabular cup to assist in securing the cup in place.  The acetabulum was irrigated and the real neutral liner was impacted and  checked for stability.  The femur traction was dropped and sequentially externally rotated while performing a release of the posterior and superomedial tissues off of the proximal femur to allow for mobility, care was taken to preserve the abductor mechanism, external rotators, and piriformis attachments.  The remaining interval between the abductors and the capsule was dissected out and a retractor was placed over the superolateral aspect of the femur over the greater trochanter.  The leg was carefully brought down into extension and adducted to provide visualization of the proximal femur for broaching.  The femur was then sequentially broached up to an appropriate size which provided for good fill and stability to the femoral broach.  A trial neck and head were placed on the femoral broach and the leg was brought up for reduction.  The hip was reduced and manual check of stability was performed with the boot detached from the table.  The hip was found to be stable in flexion internal rotation and extension external rotation.  Leg lengths were confirmed on fluoroscopy.  The hip was then dislocated the trial neck and head were removed. The leg was then brought down into extension and adduction in the proximal femur was reexposed.  The broach trial was removed and the femur was irrigated with normal saline prior to the real femoral stem being implanted.  After the femoral stem was seated and shown to have good fit and fill the appropriate head was impacted the leg was brought up and reduced.  There was good range of motion with stability in flexion internal rotation and extension external rotation on testing.  Leg lengths were found to be appropriate on fluoroscopic evaluation at this time.  The hip was then irrigated with irrisept solution and then saline solution.  The capsulotomy was repaired with Ethibond sutures.  A pericapsular and peritrochanteric cocktail with Exparel and bupivacaine was then injected as  well as the subcutaneous tissues.  The pericapuslar tissues were injected with exparel based cocktail. The fascia was closed with a #2 barbed running suture.  The deep tissues were closed with Vicryl sutures the subcutaneous tissues were closed with interrupted Vicryl sutures and a running barbed 3-0 suture.  The skin was then reinforced with Dermabond and a sterile dressing was placed.   The patient was awoken from anesthesia transferred off of the operating room table onto a hospital bed where examination of leg lengths found the leg lengths to be equal with a good distal pulse.  The patient was then transferred to the PACU in stable condition.

## 2022-08-07 NOTE — H&P (Signed)
History of Present Illness: Christina Foley is an 76 y.o. female presents for follow-up evaluation of her left hip.  Patient reports that she has continued to have pain in her left hip which has been present for years and is worsened over the last year and since her last visit last month.  She states that the pain is worse with activity such as walking and hiking described as soreness, and aching at its worst it is a 8/10.  She reports the pain is impacting her activities of daily living and have become a functional disability for her preventing her from going on walks and doing her daily chores.  She does have a history of recurrent bursitis over her left hip which she has received injections for in the past.  The patient denies fevers, chills, numbness, tingling, shortness of breath, chest pain, recent illness, or any trauma.     Past Medical History:     Past Medical History:  Diagnosis Date   Cataract cortical, senile around 2014    left eye   Chickenpox     DDD (degenerative disc disease), cervical     Diverticulosis     Encounter for blood transfusion 1977    following birth of son   GERD (gastroesophageal reflux disease)     Hyperlipidemia, mixed     Hypertension     Multinodular goiter      stable on serial imaging. No routine follow-up imaging recommended.   Osteoporosis     Seasonal allergies        Past Surgical History:      Past Surgical History:  Procedure Laterality Date   EGD   12/02/1991   Right total hip arthroplasty   08/07/2014    Dr. Marry Guan   Limited arthroscopic debridement,arthroscopic subacromial tendon repair,arthroscopic subacromial decompression,mini open rotator cuff repair and mini-open biceps tendoesis,right shoulder  Right 10/28/2017    DR.Poggi    COLONOSCOPY   03/31/2018    Hyperplastic Polyp: CBF 03/2028   EGD   04/05/2018    Looks close to Barrett's Esophagus per RTE: CBF 03/2021   EGD   05/24/2020    Normal EGD/Repeat as needed/CTL    Left, little finger proximal phalanx ORIF (Left) - left little finger   11/14/2021    Dr Rudene Christians   CATARACT EXTRACTION   several years ago    left eye   COLONOSCOPY   10/13/2007, 10/24/2003, 12/02/1991    Int Hemorrhoids, Diverticulosis: CBF 10/2017; Recall Ltr mailed 09/17/2017 (dh)   Foot surgery       TUBAL LIGATION Bilateral        Past Family History: Family History       Family History  Problem Relation Age of Onset   Myocardial Infarction (Heart attack) Father 29   High blood pressure (Hypertension) Father     Thyroid cancer Mother 59   High blood pressure (Hypertension) Brother     Osteoarthritis Paternal Grandmother     Breast cancer Maternal Aunt     High blood pressure (Hypertension) Brother         Medications:       Current Outpatient Medications Ordered in Epic  Medication Sig Dispense Refill   acetaminophen (TYLENOL) 500 MG tablet Take 500 mg by mouth once daily. prn         biotin 2,500 mcg Tab Take by mouth once daily One a day now per patient.       CALCIUM CARBONATE (CALCIUM 500 ORAL) Take 1  tablet by mouth once daily.          cholecalciferol (VITAMIN D3) 2,000 unit tablet Take 2,000 Units by mouth once daily.          dicyclomine (BENTYL) 20 mg tablet Take 1 tablet (20 mg total) by mouth every 6 (six) hours as needed (IBS) 60 tablet 5   diphenoxylate-atropine (LOMOTIL) 2.5-0.025 mg tablet Take 1 tablet by mouth once daily as needed for Diarrhea 40 tablet 1   EPIPEN 2-PAK 0.3 mg/0.3 mL (1:1,000) pen injector Inject 0.3 mg into the muscle once as needed.          estradioL (ESTRACE) 0.01 % (0.1 mg/gram) vaginal cream Place 0.5 g vaginally once a week 42.5 g 11   famotidine (PEPCID) 20 MG tablet Take 1 tablet (20 mg total) by mouth once daily 90 tablet 3   fluticasone propionate (FLONASE) 50 mcg/actuation nasal spray Place 2 sprays into both nostrils once daily as needed for Rhinitis       gabapentin (NEURONTIN) 100 MG capsule Take 1 capsule (100 mg total) by  mouth once daily 90 capsule 3   L. ACIDOPHILUS/PECTIN, CITRUS (ACIDOPHILUS PROBIOTIC ORAL) Take by mouth once daily.       loratadine (CLARITIN) 10 mg capsule Take 10 mg by mouth once daily as needed.       minoxidiL 2.5 MG tablet Take 1.25 mg by mouth once daily       pantoprazole (PROTONIX) 40 MG DR tablet Take 1 tablet (40 mg total) by mouth once daily Take 1 tablet 30 mins before breakfast and 1 tablet 30 mins before dinner. 90 tablet 3    No current Epic-ordered facility-administered medications on file.      Allergies:      Allergies  Allergen Reactions   Fosamax [Alendronate] Other (See Comments)      Severe reflux      Review of Systems:  A comprehensive 14 point ROS was performed, reviewed, and the pertinent orthopaedic findings are documented in the HPI.    Physical Exam: Body mass index is 24.73 kg/m. General/Constitutional: No apparent distress: well-nourished and well developed. Pulmonary exam: Lungs clear to auscultation bilaterally no wheezing rales or rhonchi Cardiac exam: Regular rate and rhythm, systolic murmur noted.   Lymphatic: No palpable adenopathy. Respiratory: Non-labored breathing Vascular: No edema, swelling or tenderness, except as noted in detailed exam. Integumentary: No impressive skin lesions present, except as noted in detailed exam. Neuro/Psych: Normal mood and affect, oriented to person, place and time. Musculoskeletal: Normal, except as noted in detailed exam and in HPI.   Left  hip exam   SKIN: intact SWELLING: none WARMTH: no warmth TENDERNESS: Mild lateral hip tenderness to palpation, Stinchfield Positive ROM: 10 degrees internal rotation and 30 degrees external rotation and pain with internal rotation STRENGTH: normal GAIT: normal STABILITY: stable to testing CREPITUS: no LEG LENGTH DISCREPANCY: right longer by .5 cm NEUROLOGICAL EXAM: normal VASCULAR EXAM: normal LUMBAR SPINE:        tenderness: no                                      straight leg raising sign: no                                     motor exam: normal   The  contralateral hip was examined for comparison and it showed: TENDERNESS: none ROM: normal and full STRENGTH: normal STABILITY: stable to testing   Hip Imaging :  I have reviewed AP pelvis and lateral hip X-rays (2 views) from the previous visit of the left hip which reveal moderate/severe degenerative changes with superior and medial joint space narrowing and osteophyte formation, subchondral cysts and sclerosis of the left hip. The right hip is noted to be status post total hip replacement with components in good position no periprosthetic fractures or loosening noted.   I reviewed lateral weightbearing sit to stand x-rays of the lumbosacral spine and pelvis ordered and taken today in clinic 2 views.  The images show degenerative changes to the lumbosacral junction with facet arthropathy.  No spondylolisthesis noted.  There is reasonable sacral inclination motion from sitting to standing with sitting at 14 degrees sacral inclination and standing at 36 degrees as measured on imaging software.  No fractures or dislocations noted   Assessment:    left hip osteoarthritis   Plan: Harry is a 76 year old female who presents with left hip bone on bone arthritis. Based upon the patient's continued symptoms and failure to respond to conservative treatment, I have recommended a left total hip replacement for this patient. A long discussion took place with the patient describing what a total joint replacement is and what the procedure would entail. A hip model, similar to the implants that will be used during the operation, was utilized to demonstrate the implants. Choices of implant manufactures were discussed and reviewed. The ability to secure the implant utilizing cement or cementless (press fit) fixation was discussed. Anterior and posterior exposures were discussed. For this patient an appropriate  approach will be anterior.   The hospitalization and post-operative care and rehabilitation were also discussed. The use of perioperative antibiotics and DVT prophylaxis were discussed. The risk, benefits and alternatives to a surgical intervention were discussed at length with the patient. The patient was also advised of risks related to the medical comorbidities and elevated body mass index (BMI). A lengthy discussion took place to review the most common complications including but not limited to: deep vein thrombosis, pulmonary embolus, heart attack, stroke, infection, wound breakdown, dislocation, numbness, leg length in-equality, damage to nerves, tendon,muscles, arteries or other blood vessels, death and other possible complications from anesthesia. The patient was told that we will take steps to minimize these risks by using sterile technique, antibiotics and DVT prophylaxis when appropriate and follow the patient postoperatively in the office setting to monitor progress. The possibility of recurrent pain, no improvement in pain and actual worsening of pain were also discussed with the patient. The risk of dislocation following total hip replacement was discussed and potential precautions to prevent dislocation were reviewed.    The discharge plan of care focused on the patient going home following surgery. The patient was encouraged to make the necessary arrangements to have someone stay with them when they are discharged home.    The benefits of surgery were discussed with the patient including the potential for improving the patient's current clinical condition through operative intervention. Alternatives to surgical intervention including continued conservative management were also discussed in detail. All questions were answered to the satisfaction of the patient. The patient participated and agreed to the plan of care as well as the use of the recommended implants for their total hip replacement  surgery. An information packet was given to the patient to review prior to surgery.  The patient received medical clearance for the surgery.   Steffanie Rainwater, MD [facility] [address] [place], [place] [address] Phone: [phone]

## 2022-08-07 NOTE — Anesthesia Procedure Notes (Signed)
Spinal  Patient location during procedure: OR Start time: 08/07/2022 7:40 AM End time: 08/07/2022 7:44 AM Reason for block: surgical anesthesia Staffing Performed: resident/CRNA  Resident/CRNA: Nelda Marseille, CRNA Performed by: Nelda Marseille, CRNA Authorized by: Molli Barrows, MD   Preanesthetic Checklist Completed: patient identified, IV checked, site marked, risks and benefits discussed, surgical consent, monitors and equipment checked, pre-op evaluation and timeout performed Spinal Block Patient position: sitting Prep: ChloraPrep Patient monitoring: heart rate, continuous pulse ox, blood pressure and cardiac monitor Approach: midline Location: L3-4 Injection technique: single-shot Needle Needle type: Whitacre and Introducer  Needle gauge: 25 G Needle length: 9 cm Assessment Sensory level: T10 Events: CSF return Additional Notes Sterile aseptic technique used throughout the procedure.  Negative paresthesia. Negative blood return. Positive free-flowing CSF. Expiration date of kit checked and confirmed. Patient tolerated procedure well, without complications.

## 2022-08-07 NOTE — Evaluation (Signed)
Physical Therapy Evaluation Patient Details Name: Christina Foley MRN: [id number] DOB: [birth year] Today's Date: 08/07/2022  History of Present Illness  admitted for acute hospitalization s/p L THR, anterior approach, WBAT (08/07/22)  Clinical Impression  Patient resting in bed upon arrival to room; alert and oriented, follows commands and agreeable to participation with session. Eager for OOB, voicing need for toileting as able.  Rates L hip pain 3-4/10; meds received prior to session.  Demonstrates excellent post-op strength (3-/5) and ROM to L LE; excellent muscle activation and control with both isolated therex and functional activities.  Able to complete bed mobility with supervision; sit/stand, basic transfers (bed/chair and chair/BSC) with RW, cga/min assist.  Good L LE WBing; good balance and overall control.  Anticipate consistent progression towards all mobility goals in subsequent sessions; patient very motivated and eager to progress. Would benefit from skilled PT to address above deficits and promote optimal return to PLOF.; Recommend transition to HHPT upon discharge from acute hospitalization.      Recommendations for follow up therapy are one component of a multi-disciplinary discharge planning process, led by the attending physician.  Recommendations may be updated based on patient status, additional functional criteria and insurance authorization.  Follow Up Recommendations Home health PT      Assistance Recommended at Discharge PRN  Patient can return home with the following  A little help with walking and/or transfers;A little help with bathing/dressing/bathroom    Equipment Recommendations Rolling walker (2 wheels)  Recommendations for Other Services       Functional Status Assessment Patient has had a recent decline in their functional status and demonstrates the ability to make significant improvements in function in a reasonable and predictable amount of  time.     Precautions / Restrictions Precautions Precautions: Fall;Anterior Hip Restrictions Weight Bearing Restrictions: Yes LLE Weight Bearing: Weight bearing as tolerated      Mobility  Bed Mobility Overal bed mobility: Needs Assistance Bed Mobility: Supine to Sit     Supine to sit: Supervision          Transfers Overall transfer level: Needs assistance Equipment used: Rolling walker (2 wheels) Transfers: Sit to/from Stand, Bed to chair/wheelchair/BSC Sit to Stand: Min guard Stand pivot transfers: Min guard         General transfer comment: cuing for hand placement    Ambulation/Gait               General Gait Details: deferred due to toileting needs  Stairs            Wheelchair Mobility    Modified Rankin (Stroke Patients Only)       Balance Overall balance assessment: Needs assistance Sitting-balance support: No upper extremity supported, Feet supported Sitting balance-Leahy Scale: Good     Standing balance support: Bilateral upper extremity supported Standing balance-Leahy Scale: Fair                               Pertinent Vitals/Pain Pain Assessment Pain Assessment: Faces Faces Pain Scale: Hurts little more Pain Location: L hip Pain Descriptors / Indicators: Aching, Headache, Grimacing Pain Intervention(s): Limited activity within patient's tolerance, Monitored during session, Repositioned    Home Living Family/patient expects to be discharged to:: Private residence Living Arrangements: Spouse/significant other Available Help at Discharge: Family Type of Home: House Home Access: Stairs to enter     Alternate Level Stairs-Number of Steps: 3 Home Layout: Multi-level;Able to live on  main level with bedroom/bathroom        Prior Function Prior Level of Function : Independent/Modified Independent             Mobility Comments: Indep with ADLs, household and community mobilization without assist device;  denies fall history; + driving. Very physically active, enjoys hiking ([place] hiking [place] May, 2024)       Hand Dominance        Extremity/Trunk Assessment   Upper Extremity Assessment Upper Extremity Assessment: Overall WFL for tasks assessed    Lower Extremity Assessment Lower Extremity Assessment: Generalized weakness (L hip grossly 3-/5 throughout LEs; otherwise, grossly WFL)       Communication   Communication: No difficulties  Cognition Arousal/Alertness: Awake/alert Behavior During Therapy: WFL for tasks assessed/performed Overall Cognitive Status: Within Functional Limits for tasks assessed                                          General Comments      Exercises Other Exercises Other Exercises: L LE supine therex, 1x10, active ROM: ankle pumps, quad sets, SAQs, heel slides, hip abduct/adduct.  Good isolated muscle activation and control Other Exercises: Toilet transfer, SPT with rw, cga/close sup; sit/stand from [facility] wtih RW, cga/close sup   Assessment/Plan    PT Assessment Patient needs continued PT services  PT Problem List Decreased strength;Decreased range of motion;Decreased activity tolerance;Decreased balance;Decreased mobility;Decreased knowledge of use of DME;Decreased safety awareness;Decreased knowledge of precautions;Pain       PT Treatment Interventions DME instruction;Gait training;Stair training;Functional mobility training;Therapeutic activities;Therapeutic exercise;Balance training;Patient/family education    PT Goals (Current goals can be found in the Care Plan section)  Acute Rehab PT Goals Patient Stated Goal: to get out of the bed PT Goal Formulation: With patient Time For Goal Achievement: 08/21/22 Potential to Achieve Goals: Good    Frequency BID     Co-evaluation               AM-PAC PT "6 Clicks" Mobility  Outcome Measure Help needed turning from your back to your side while in a flat  bed without using bedrails?: None Help needed moving from lying on your back to sitting on the side of a flat bed without using bedrails?: None Help needed moving to and from a bed to a chair (including a wheelchair)?: A Little Help needed standing up from a chair using your arms (e.g., wheelchair or bedside chair)?: A Little Help needed to walk in hospital room?: A Little Help needed climbing 3-5 steps with a railing? : A Little 6 Click Score: 20    End of Session   Activity Tolerance: Patient tolerated treatment well Patient left: in chair;with call bell/phone within reach;with chair alarm set Nurse Communication: Mobility status PT Visit Diagnosis: Other abnormalities of gait and mobility (R26.89);Difficulty in walking, not elsewhere classified (R26.2);Pain Pain - Right/Left: Left Pain - part of body: Hip    Time: 1537-1600 PT Time Calculation (min) (ACUTE ONLY): 23 min   Charges:   PT Evaluation $PT Eval Moderate Complexity: 1 Mod PT Treatments $Therapeutic Activity: 8-22 mins       Louellen Haldeman H. Owens Shark, PT, DPT, NCS 08/07/22, 5:54 PM [phone]

## 2022-08-08 DIAGNOSIS — R269 Unspecified abnormalities of gait and mobility: Secondary | ICD-10-CM | POA: Diagnosis not present

## 2022-08-08 DIAGNOSIS — M1612 Unilateral primary osteoarthritis, left hip: Secondary | ICD-10-CM | POA: Diagnosis not present

## 2022-08-08 LAB — CBC
HCT: 30.6 % — ABNORMAL LOW (ref 36.0–46.0)
Hemoglobin: 9.8 g/dL — ABNORMAL LOW (ref 12.0–15.0)
MCH: 29.9 pg (ref 26.0–34.0)
MCHC: 32 g/dL (ref 30.0–36.0)
MCV: 93.3 fL (ref 80.0–100.0)
Platelets: 311 10*3/uL (ref 150–400)
RBC: 3.28 MIL/uL — ABNORMAL LOW (ref 3.87–5.11)
RDW: 12.9 % (ref 11.5–15.5)
WBC: 9.3 10*3/uL (ref 4.0–10.5)
nRBC: 0 % (ref 0.0–0.2)

## 2022-08-08 LAB — BASIC METABOLIC PANEL
Anion gap: 7 (ref 5–15)
BUN: 14 mg/dL (ref 8–23)
CO2: 25 mmol/L (ref 22–32)
Calcium: 8.3 mg/dL — ABNORMAL LOW (ref 8.9–10.3)
Chloride: 108 mmol/L (ref 98–111)
Creatinine, Ser: 0.56 mg/dL (ref 0.44–1.00)
GFR, Estimated: >60 mL/min (ref >60)
Glucose, Bld: 102 mg/dL — ABNORMAL HIGH (ref 70–99)
Potassium: 3.4 mmol/L — ABNORMAL LOW (ref 3.5–5.1)
Sodium: 140 mmol/L (ref 135–145)

## 2022-08-08 LAB — SURGICAL PATHOLOGY

## 2022-08-08 MED ORDER — DOCUSATE SODIUM 100 MG PO CAPS: 100.0000 mg | ORAL_CAPSULE | Freq: Two times a day (BID) | ORAL | 0 refills | Status: AC

## 2022-08-08 MED ORDER — CELECOXIB 200 MG PO CAPS: 200.0000 mg | ORAL_CAPSULE | Freq: Two times a day (BID) | ORAL | 0 refills | Status: AC

## 2022-08-08 MED ORDER — ENOXAPARIN SODIUM 40 MG/0.4ML IJ SOSY: 40.0000 mg | PREFILLED_SYRINGE | INTRAMUSCULAR | 0 refills | Status: DC

## 2022-08-08 MED ORDER — POTASSIUM CHLORIDE 20 MEQ PO PACK
20.0000 meq | PACK | Freq: Once | ORAL | Status: AC
  Administered 2022-08-08: 20 meq via ORAL
  Filled 2022-08-08: qty 1

## 2022-08-08 MED ORDER — ONDANSETRON HCL 4 MG PO TABS: 4.0000 mg | ORAL_TABLET | Freq: Four times a day (QID) | ORAL | 0 refills | Status: AC | PRN

## 2022-08-08 MED ORDER — TRAMADOL HCL 50 MG PO TABS: 50.0000 mg | ORAL_TABLET | Freq: Four times a day (QID) | ORAL | 0 refills | Status: AC | PRN

## 2022-08-08 NOTE — Progress Notes (Signed)
Physical Therapy Treatment Patient Details Name: Christina Foley MRN: [id number] DOB: Feb 27, 1946 Today's Date: 08/08/2022   History of Present Illness admitted for acute hospitalization s/p L THR, anterior approach, WBAT (08/07/22)    PT Comments    Pt was long sitting in bed. Endorsing more back pain than hip pain. 5/10 pain overall during wt bearing activity. She was easily and safely able to exit bed, stand, and ambulate > 375f with RW. No LOB or safety concerns throughout. Also able to ascend/descend 4 stair with +1 L rail. Pt feels confident in her abilities and is eager to DC home this date. AChief Strategy Officerissued HEP and discussed car transfers. Highly recommend continued skilled PT at DC to maximize independence while assisting pt to PLOF.    Recommendations for follow up therapy are one component of a multi-disciplinary discharge planning process, led by the attending physician.  Recommendations may be updated based on patient status, additional functional criteria and insurance authorization.  Follow Up Recommendations  Home health PT     Assistance Recommended at Discharge PRN  Patient can return home with the following A little help with walking and/or transfers;A little help with bathing/dressing/bathroom   Equipment Recommendations  Rolling walker (2 wheels);BSC/3in1       Precautions / Restrictions Precautions Precautions: Fall;Anterior Hip Precaution Booklet Issued: Yes (comment) Restrictions Weight Bearing Restrictions: Yes LLE Weight Bearing: Weight bearing as tolerated     Mobility  Bed Mobility Overal bed mobility: Needs Assistance Bed Mobility: Supine to Sit  Supine to sit: Supervision  Transfers Overall transfer level: Needs assistance Equipment used: Rolling walker (2 wheels) Transfers: Sit to/from Stand Sit to Stand: Supervision   Ambulation/Gait Ambulation/Gait assistance: Supervision Gait Distance (Feet): 400 Feet Assistive device: Rolling  walker (2 wheels) Gait Pattern/deviations: Step-through pattern Gait velocity: WNL     General Gait Details: no LOB or safety concerns   Stairs Stairs: Yes Stairs assistance: Supervision Stair Management: One rail Left Number of Stairs: 4 General stair comments: Pt was easily and safely able to ambulate up/down stairs with L rail. step to side stepping method used. Pt feels safe and confident with her abilities to safely enter exit her home.    Balance Overall balance assessment: Modified Independent       Cognition Arousal/Alertness: Awake/alert Behavior During Therapy: WFL for tasks assessed/performed Overall Cognitive Status: Within Functional Limits for tasks assessed              General Comments General comments (skin integrity, edema, etc.): Issued HEP, Ice pack, and discussed car transfers. Pt states understanding and is eager for DC home.      Pertinent Vitals/Pain Pain Assessment Pain Assessment: 0-10 Pain Score: 5  Pain Location: L hip Pain Descriptors / Indicators: Aching, Headache, Grimacing Pain Intervention(s): Limited activity within patient's tolerance, Monitored during session, Premedicated before session, Repositioned, Ice applied     PT Goals (current goals can now be found in the care plan section) Acute Rehab PT Goals Patient Stated Goal: go home Progress towards PT goals: Progressing toward goals    Frequency    BID      PT Plan Current plan remains appropriate       AM-PAC PT "6 Clicks" Mobility   Outcome Measure  Help needed turning from your back to your side while in a flat bed without using bedrails?: None Help needed moving from lying on your back to sitting on the side of a flat bed without using bedrails?: None Help needed moving  to and from a bed to a chair (including a wheelchair)?: A Little Help needed standing up from a chair using your arms (e.g., wheelchair or bedside chair)?: A Little Help needed to walk in  hospital room?: A Little Help needed climbing 3-5 steps with a railing? : A Little 6 Click Score: 20    End of Session   Activity Tolerance: Patient tolerated treatment well Patient left: in chair;with call bell/phone within reach;with chair alarm set Nurse Communication: Mobility status PT Visit Diagnosis: Other abnormalities of gait and mobility (R26.89);Difficulty in walking, not elsewhere classified (R26.2);Pain Pain - Right/Left: Left Pain - part of body: Hip     Time: 3825-0539 PT Time Calculation (min) (ACUTE ONLY): 30 min  Charges:  $Gait Training: 8-22 mins $Therapeutic Exercise: 8-22 mins                     Julaine Fusi PTA 08/08/22, 8:48 AM

## 2022-08-08 NOTE — Progress Notes (Signed)
Spoke with the patient and she lives at home with her husband SHE needs a rolling walker and a 3 ion 1 [employer] to deliver to the bedside prior to [place] with [employer] has confirmed that they were set up for [facility] prior to surgery by the surgeons office

## 2022-08-08 NOTE — Plan of Care (Signed)

## 2022-08-08 NOTE — Plan of Care (Incomplete)
  Problem: Acute Rehab PT Goals(only PT should resolve) Goal: Pt Will Go Supine/Side To Sit Outcome: Adequate for Discharge Goal: Pt Will Transfer Bed To Chair/Chair To Bed Outcome: Adequate for Discharge Goal: Pt Will Ambulate Outcome: Adequate for Discharge Goal: Pt Will Go Up/Down Stairs Outcome: Adequate for Discharge

## 2022-08-08 NOTE — Discharge Instructions (Signed)
Instructions after Anterior Total Hip Replacement        Dr. Serita Butcher., M.D.      Dept. of [facility]  [place], [place]  [address]  Phone: [phone]   Fax: [phone]    DIET: Drink plenty of non-alcoholic fluids. Resume your normal diet. Include foods high in fiber.  ACTIVITY:  You may use crutches or a walker with weight-bearing as tolerated, unless instructed otherwise. You may be weaned off of the walker or crutches by your Physical Therapist.  Continue doing gentle exercises. Exercising will reduce the pain and swelling, increase motion, and prevent muscle weakness.   Please continue to use the TED compression stockings for 2 weeks. You may remove the stockings at night, but should reapply them in the morning. Do not drive or operate any equipment until instructed.  WOUND CARE:  Continue to use ice packs periodically to reduce pain and swelling. You may shower with honeycomb dressing 3 days after your surgery. Do not submerge incision site under water. Remove honeycomb dressing 7 days after surgery and allow dermabond to fall off on its own.   MEDICATIONS: You may resume your regular medications. Please take the pain medication as prescribed on the medication list. Do not take pain medication on an empty stomach. You have been given a prescription for a blood thinner to prevent blood clots. Please take the medication as instructed. (NOTE: After completing a 2 week course of Lovenox, take one Enteric-coated 81 mg aspirin twice a day.) Pain medications and iron supplements can cause constipation. Use a stool softener (Senokot or Colace) on a daily basis and a laxative (dulcolax or miralax) as needed. Do not drive or drink alcoholic beverages when taking pain medications.  CALL THE OFFICE FOR: Temperature above 101 degrees Excessive bleeding or drainage on the dressing. Excessive swelling, coldness, or  paleness of the toes. Persistent nausea and vomiting.  FOLLOW-UP:  You should have an appointment to return to the office in 2 weeks after surgery. Arrangements have been made for continuation of Physical Therapy (either home therapy or outpatient therapy).

## 2022-08-08 NOTE — Discharge Summary (Signed)
Physician Discharge Summary  Patient ID: Christina Foley MRN: [id number] DOB/AGE: 74-Jan-1947 74 y.o.  Admit date: 08/07/2022 Discharge date: 08/08/2022  Admission Diagnoses:  Osteoarthritis of left hip [M16.12]   Discharge Diagnoses: Patient Active Problem List   Diagnosis Date Noted   Osteoarthritis of left hip 08/07/2022   Vaginal atrophy 02/07/2016   Vulvitis 02/07/2016   Menopause 02/07/2016   Osteopenia 02/07/2016   HPV test positive 02/07/2016   Essential hypertension 02/07/2016   Diverticulosis 02/07/2016    Past Medical History:  Diagnosis Date   Back pain    Cataract cortical, senile, left    DDD (degenerative disc disease), cervical    Decreased libido    Diverticulitis    Diverticulosis    Environmental allergies    seasonal   GERD (gastroesophageal reflux disease)    Heart murmur    1970s   HPV test positive    Hyperlipidemia    Hypertension    Menopausal and postmenopausal disorder    Menopause    Osteoarthritis    Osteopenia    Osteoporosis    Pneumonia    Postmenopausal atrophic vaginitis    Rectocele    Shoulder pain, right    rotator cuff tear   Thyroid nodule      Transfusion: none   Consultants (if any):   Discharged Condition: Improved  Hospital Course: Christina Foley is an 76 y.o. female who was admitted 08/07/2022 with a diagnosis of Osteoarthritis of left hip and went to the operating room on 08/07/2022 and underwent the above named procedures.    Surgeries: Procedure(s): TOTAL HIP ARTHROPLASTY ANTERIOR APPROACH on 08/07/2022 Patient tolerated the surgery well. Taken to PACU where she was stabilized and then transferred to the orthopedic floor.  Started on Lovenox 40 mg q 24 hrs. TEDs and SCDs applied bilaterally. Heels elevated on bed. No evidence of DVT. Negative Homan. Physical therapy started on day #1 for gait training and transfer. OT started day #1 for ADL and assisted devices.  Patient's IV was d/c on day  #1.  On post op day #1 patient was stable and ready for discharge to home with HHPT.  Implants:  2m Trident II Tritanium cluster hole    Liner: X3 Neutral 36/D  Stem Insignia High Offset size 4  Head: Biolox 313m+[id number] She was given perioperative antibiotics:  Anti-infectives (From admission, onward)    Start     Dose/Rate Route Frequency Ordered Stop   08/07/22 1400  ceFAZolin (ANCEF) IVPB 2g/100 mL premix        2 g 200 mL/hr over 30 Minutes Intravenous Every 6 hours 08/07/22 1129 08/07/22 2145   08/07/22 0632  ceFAZolin (ANCEF) 2-4 GM/100ML-% IVPB       Note to Pharmacy: FueOlena Mater cabinet override      08/07/22 0632 08/07/22 2200   08/07/22 0600  ceFAZolin (ANCEF) IVPB 2g/100 mL premix        2 g 200 mL/hr over 30 Minutes Intravenous On call to O.R. 08/07/22 0116803/30/23 0802     .  She was given sequential compression devices, early ambulation, and Lovneox TEDs for DVT prophylaxis.  She benefited maximally from the hospital stay and there were no complications.    Recent vital signs:  Vitals:   08/08/22 0553 08/08/22 0700  BP: (!) 123/52 (!) 118/58  Pulse: 83 81  Resp: 18 16  Temp: 98.5 F (36.9 C) 99.5 F (37.5 C)  SpO2: 97% 95%  Recent laboratory studies:  Lab Results  Component Value Date   HGB 9.8 (L) 08/08/2022   HGB 12.6 02/04/2022   HGB 10.1 (L) 08/09/2014   Lab Results  Component Value Date   WBC 9.3 08/08/2022   PLT 311 08/08/2022   Lab Results  Component Value Date   INR 0.9 07/26/2014   Lab Results  Component Value Date   NA 140 08/08/2022   K 3.4 (L) 08/08/2022   CL 108 08/08/2022   CO2 25 08/08/2022   BUN 14 08/08/2022   CREATININE 0.56 08/08/2022   GLUCOSE 102 (H) 08/08/2022    Discharge Medications:   Allergies as of 08/08/2022       Reactions   Fosamax [alendronate] Other (See Comments)   Severe reflux        Medication List     TAKE these medications    acetaminophen 500 MG tablet Commonly known as:  TYLENOL Take 500-1,000 mg by mouth every 8 (eight) hours as needed for moderate pain.   ALPRAZolam 0.25 MG tablet Commonly known as: XANAX Take 0.25 mg by mouth daily as needed for anxiety (when traveling).   ascorbic acid 500 MG tablet Commonly known as: VITAMIN C Take 500 mg by mouth daily.   Biotin 2500 MCG Caps Take 2,500 mcg by mouth daily.   celecoxib 200 MG capsule Commonly known as: CeleBREX Take 1 capsule (200 mg total) by mouth 2 (two) times daily for 14 days.   diphenoxylate-atropine 2.5-0.025 MG tablet Commonly known as: LOMOTIL Take 1 tablet by mouth 4 (four) times daily as needed for diarrhea or loose stools.   docusate sodium 100 MG capsule Commonly known as: COLACE Take 1 capsule (100 mg total) by mouth 2 (two) times daily.   enoxaparin 40 MG/0.4ML injection Commonly known as: LOVENOX Inject 0.4 mLs (40 mg total) into the skin daily for 14 days. Start taking on: August 09, 2022   EPINEPHrine 0.3 mg/0.3 mL Soaj injection Commonly known as: EPI-PEN Inject 0.3 mg into the muscle as needed for anaphylaxis.   estradiol 0.1 MG/GM vaginal cream Commonly known as: ESTRACE VAGINAL Place 0.5 Applicatorfuls vaginally 2 (two) times a week. What changed: when to take this   famotidine 20 MG tablet Commonly known as: PEPCID Take 20 mg by mouth at bedtime.   fluticasone 50 MCG/ACT nasal spray Commonly known as: FLONASE Place 2 sprays into both nostrils at bedtime.   gabapentin 100 MG capsule Commonly known as: NEURONTIN Take 100 mg by mouth at bedtime.   loratadine 10 MG tablet Commonly known as: CLARITIN Take 10 mg by mouth every morning.   methocarbamol 500 MG tablet Commonly known as: ROBAXIN Take 500 mg by mouth every 8 (eight) hours as needed for muscle spasms.   minoxidil 2.5 MG tablet Commonly known as: LONITEN Take 1.25 mg by mouth every morning.   ondansetron 4 MG tablet Commonly known as: ZOFRAN Take 1 tablet (4 mg total) by mouth every 6  (six) hours as needed for nausea.   pantoprazole 40 MG tablet Commonly known as: PROTONIX Take 40 mg by mouth 2 (two) times daily.   PROBIOTIC DAILY PO Take 1 capsule by mouth daily.   traMADol 50 MG tablet Commonly known as: ULTRAM Take 1 tablet (50 mg total) by mouth every 6 (six) hours as needed for moderate pain.   Turmeric 500 MG Caps Take 500 mg by mouth daily.   Viactiv Calcium Immune 650-20-5.5 MG-MCG-MG Chew Generic drug: Calcium-Cholecalciferol-Zinc Chew 1 each by  mouth daily.   Vitamin D3 50 MCG (2000 UT) capsule Take 2,000 Units by mouth daily.               Durable Medical Equipment  (From admission, onward)           Start     Ordered   08/08/22 0850  For home use only DME Bedside commode  Once       Question:  Patient needs a bedside commode to treat with the following condition  Answer:  Impaired mobility   08/08/22 0849   08/08/22 0848  For home use only DME Walker rolling  Once       Question Answer Comment  Walker: With 5 Inch Wheels   Patient needs a walker to treat with the following condition Impaired mobility      08/08/22 0849            Diagnostic Studies: DG HIP UNILAT WITH PELVIS 1V LEFT  Result Date: 08/07/2022 CLINICAL DATA:  Total left hip replacement small, surgery, elective EXAM: DG HIP (WITH OR WITHOUT PELVIS) 1V*L* COMPARISON:  None Available. FINDINGS: Intraoperative images during left total hip arthroplasty. Normal alignment without evidence of loosening or periprosthetic fracture. IMPRESSION: Intraoperative images during left hip arthroplasty. No evidence of immediate hardware complication. Electronically Signed   By: Maurine Simmering M.D.   On: 08/07/2022 09:28   DG C-Arm 1-60 Min-No Report  Result Date: 08/07/2022 Fluoroscopy was utilized by the requesting physician.  No radiographic interpretation.    Disposition: Discharge disposition: 06-Home-Health Care Svc          Follow-up Information     Duanne Guess, PA-C Follow up in 2 week(s).   Specialties: Orthopedic Surgery, Emergency Medicine Contact information: [place] [address] [phone]                  Signed: Feliberto Gottron 08/08/2022, 12:31 PM

## 2022-08-08 NOTE — Progress Notes (Signed)
   Subjective: 1 Day Post-Op Procedure(s) (LRB): TOTAL HIP ARTHROPLASTY ANTERIOR APPROACH (Left) Patient reports pain as mild.   Patient is well, and has had no acute complaints or problems Denies any CP, SOB, ABD pain. We will continue therapy today.  Plan is to go Home after hospital stay.  Objective: Vital signs in last 24 hours: Temp:  [96.8 F (36 C)-99.5 F (37.5 C)] 99.5 F (37.5 C) (12/01 0700) Pulse Rate:  [65-90] 81 (12/01 0700) Resp:  [15-20] 16 (12/01 0700) BP: (114-145)/(52-69) 118/58 (12/01 0700) SpO2:  [95 %-100 %] 95 % (12/01 0700)  Intake/Output from previous day: 11/30 0701 - 12/01 0700 In: 3101 [I.V.:2801; IV Piggyback:300] Out: 1550 [Urine:1450; Blood:100] Intake/Output this shift: No intake/output data recorded.  Recent Labs    08/08/22 0530  HGB 9.8*   Recent Labs    08/08/22 0530  WBC 9.3  RBC 3.28*  HCT 30.6*  PLT 311   Recent Labs    08/08/22 0530  NA 140  K 3.4*  CL 108  CO2 25  BUN 14  CREATININE 0.56  GLUCOSE 102*  CALCIUM 8.3*   No results for input(s): "LABPT", "INR" in the last 72 hours.  EXAM General - Patient is Alert, Appropriate, and Oriented Extremity - Neurovascular intact Sensation intact distally Intact pulses distally Dorsiflexion/Plantar flexion intact No cellulitis present Compartment soft Dressing - dressing C/D/I and no drainage Motor Function - intact, moving foot and toes well on exam.   Past Medical History:  Diagnosis Date   Back pain    Cataract cortical, senile, left    DDD (degenerative disc disease), cervical    Decreased libido    Diverticulitis    Diverticulosis    Environmental allergies    seasonal   GERD (gastroesophageal reflux disease)    Heart murmur    1970s   HPV test positive    Hyperlipidemia    Hypertension    Menopausal and postmenopausal disorder    Menopause    Osteoarthritis    Osteopenia    Osteoporosis    Pneumonia    Postmenopausal atrophic vaginitis     Rectocele    Shoulder pain, right    rotator cuff tear   Thyroid nodule     Assessment/Plan:   1 Day Post-Op Procedure(s) (LRB): TOTAL HIP ARTHROPLASTY ANTERIOR APPROACH (Left) Principal Problem:   Osteoarthritis of left hip  Estimated body mass index is 25.24 kg/m as calculated from the following:   Height as of this encounter: '5\' 2"'$  (1.575 m).   Weight as of this encounter: 62.6 kg. Advance diet Up with therapy Pain well controlled  Vital signs are stable  Hypokalemia -potassium 3.4, will give one-time dose of Klor-Con  Acute post op blood loss anemia -hemoglobin 9.8, stable.  Overall patient doing very well.  Pain well controlled.  Vital signs are stable.  Labs are stable anticipate discharge to home with home health PT today pending completion of PT goals   DVT Prophylaxis - Lovenox, TED hose, and SCDs Weight-Bearing as tolerated to left leg   T. Rachelle Hora, PA-C [place] 08/08/2022, 7:59 AM

## 2022-08-08 NOTE — Progress Notes (Cosign Needed)
Patient is not able to walk the distance required to go the bathroom, or he/she is unable to safely negotiate stairs required to access the bathroom.  A 3in1 BSC will alleviate this problem  

## 2022-08-09 DIAGNOSIS — Z79891 Long term (current) use of opiate analgesic: Secondary | ICD-10-CM | POA: Diagnosis not present

## 2022-08-09 DIAGNOSIS — E782 Mixed hyperlipidemia: Secondary | ICD-10-CM | POA: Diagnosis not present

## 2022-08-09 DIAGNOSIS — M8580 Other specified disorders of bone density and structure, unspecified site: Secondary | ICD-10-CM | POA: Diagnosis not present

## 2022-08-09 DIAGNOSIS — Z7901 Long term (current) use of anticoagulants: Secondary | ICD-10-CM | POA: Diagnosis not present

## 2022-08-09 DIAGNOSIS — M5030 Other cervical disc degeneration, unspecified cervical region: Secondary | ICD-10-CM | POA: Diagnosis not present

## 2022-08-09 DIAGNOSIS — Z9841 Cataract extraction status, right eye: Secondary | ICD-10-CM | POA: Diagnosis not present

## 2022-08-09 DIAGNOSIS — Z9181 History of falling: Secondary | ICD-10-CM | POA: Diagnosis not present

## 2022-08-09 DIAGNOSIS — Z96641 Presence of right artificial hip joint: Secondary | ICD-10-CM | POA: Diagnosis not present

## 2022-08-09 DIAGNOSIS — I10 Essential (primary) hypertension: Secondary | ICD-10-CM | POA: Diagnosis not present

## 2022-08-09 DIAGNOSIS — K219 Gastro-esophageal reflux disease without esophagitis: Secondary | ICD-10-CM | POA: Diagnosis not present

## 2022-08-09 DIAGNOSIS — Z8719 Personal history of other diseases of the digestive system: Secondary | ICD-10-CM | POA: Diagnosis not present

## 2022-08-09 DIAGNOSIS — Z471 Aftercare following joint replacement surgery: Secondary | ICD-10-CM | POA: Diagnosis not present

## 2022-08-09 DIAGNOSIS — Z79899 Other long term (current) drug therapy: Secondary | ICD-10-CM | POA: Diagnosis not present

## 2022-08-09 DIAGNOSIS — Z96642 Presence of left artificial hip joint: Secondary | ICD-10-CM | POA: Diagnosis not present

## 2022-08-09 DIAGNOSIS — Z791 Long term (current) use of non-steroidal anti-inflammatories (NSAID): Secondary | ICD-10-CM | POA: Diagnosis not present

## 2022-08-09 DIAGNOSIS — Z79890 Hormone replacement therapy (postmenopausal): Secondary | ICD-10-CM | POA: Diagnosis not present

## 2022-08-09 DIAGNOSIS — M810 Age-related osteoporosis without current pathological fracture: Secondary | ICD-10-CM | POA: Diagnosis not present

## 2022-08-25 DIAGNOSIS — Z7901 Long term (current) use of anticoagulants: Secondary | ICD-10-CM | POA: Diagnosis not present

## 2022-08-25 DIAGNOSIS — Z79891 Long term (current) use of opiate analgesic: Secondary | ICD-10-CM | POA: Diagnosis not present

## 2022-08-25 DIAGNOSIS — Z79890 Hormone replacement therapy (postmenopausal): Secondary | ICD-10-CM | POA: Diagnosis not present

## 2022-08-25 DIAGNOSIS — Z9841 Cataract extraction status, right eye: Secondary | ICD-10-CM | POA: Diagnosis not present

## 2022-08-25 DIAGNOSIS — Z79899 Other long term (current) drug therapy: Secondary | ICD-10-CM | POA: Diagnosis not present

## 2022-08-25 DIAGNOSIS — Z471 Aftercare following joint replacement surgery: Secondary | ICD-10-CM | POA: Diagnosis not present

## 2022-08-25 DIAGNOSIS — Z96641 Presence of right artificial hip joint: Secondary | ICD-10-CM | POA: Diagnosis not present

## 2022-08-25 DIAGNOSIS — K219 Gastro-esophageal reflux disease without esophagitis: Secondary | ICD-10-CM | POA: Diagnosis not present

## 2022-08-25 DIAGNOSIS — M8580 Other specified disorders of bone density and structure, unspecified site: Secondary | ICD-10-CM | POA: Diagnosis not present

## 2022-08-25 DIAGNOSIS — Z96642 Presence of left artificial hip joint: Secondary | ICD-10-CM | POA: Diagnosis not present

## 2022-08-25 DIAGNOSIS — Z9181 History of falling: Secondary | ICD-10-CM | POA: Diagnosis not present

## 2022-08-25 DIAGNOSIS — I10 Essential (primary) hypertension: Secondary | ICD-10-CM | POA: Diagnosis not present

## 2022-08-25 DIAGNOSIS — E782 Mixed hyperlipidemia: Secondary | ICD-10-CM | POA: Diagnosis not present

## 2022-08-25 DIAGNOSIS — M5030 Other cervical disc degeneration, unspecified cervical region: Secondary | ICD-10-CM | POA: Diagnosis not present

## 2022-08-25 DIAGNOSIS — M810 Age-related osteoporosis without current pathological fracture: Secondary | ICD-10-CM | POA: Diagnosis not present

## 2022-08-25 DIAGNOSIS — Z8719 Personal history of other diseases of the digestive system: Secondary | ICD-10-CM | POA: Diagnosis not present

## 2022-08-25 DIAGNOSIS — Z791 Long term (current) use of non-steroidal anti-inflammatories (NSAID): Secondary | ICD-10-CM | POA: Diagnosis not present

## 2022-09-03 DIAGNOSIS — I10 Essential (primary) hypertension: Secondary | ICD-10-CM | POA: Diagnosis not present

## 2022-09-03 DIAGNOSIS — Z9181 History of falling: Secondary | ICD-10-CM | POA: Diagnosis not present

## 2022-09-03 DIAGNOSIS — M5030 Other cervical disc degeneration, unspecified cervical region: Secondary | ICD-10-CM | POA: Diagnosis not present

## 2022-09-03 DIAGNOSIS — Z96641 Presence of right artificial hip joint: Secondary | ICD-10-CM | POA: Diagnosis not present

## 2022-09-03 DIAGNOSIS — Z8719 Personal history of other diseases of the digestive system: Secondary | ICD-10-CM | POA: Diagnosis not present

## 2022-09-03 DIAGNOSIS — M8580 Other specified disorders of bone density and structure, unspecified site: Secondary | ICD-10-CM | POA: Diagnosis not present

## 2022-09-03 DIAGNOSIS — Z7901 Long term (current) use of anticoagulants: Secondary | ICD-10-CM | POA: Diagnosis not present

## 2022-09-03 DIAGNOSIS — E782 Mixed hyperlipidemia: Secondary | ICD-10-CM | POA: Diagnosis not present

## 2022-09-03 DIAGNOSIS — Z96642 Presence of left artificial hip joint: Secondary | ICD-10-CM | POA: Diagnosis not present

## 2022-09-03 DIAGNOSIS — Z791 Long term (current) use of non-steroidal anti-inflammatories (NSAID): Secondary | ICD-10-CM | POA: Diagnosis not present

## 2022-09-03 DIAGNOSIS — Z79891 Long term (current) use of opiate analgesic: Secondary | ICD-10-CM | POA: Diagnosis not present

## 2022-09-03 DIAGNOSIS — Z9841 Cataract extraction status, right eye: Secondary | ICD-10-CM | POA: Diagnosis not present

## 2022-09-03 DIAGNOSIS — M810 Age-related osteoporosis without current pathological fracture: Secondary | ICD-10-CM | POA: Diagnosis not present

## 2022-09-03 DIAGNOSIS — Z79890 Hormone replacement therapy (postmenopausal): Secondary | ICD-10-CM | POA: Diagnosis not present

## 2022-09-03 DIAGNOSIS — Z79899 Other long term (current) drug therapy: Secondary | ICD-10-CM | POA: Diagnosis not present

## 2022-09-03 DIAGNOSIS — Z471 Aftercare following joint replacement surgery: Secondary | ICD-10-CM | POA: Diagnosis not present

## 2022-09-03 DIAGNOSIS — K219 Gastro-esophageal reflux disease without esophagitis: Secondary | ICD-10-CM | POA: Diagnosis not present

## 2022-09-04 DIAGNOSIS — E782 Mixed hyperlipidemia: Secondary | ICD-10-CM | POA: Diagnosis not present

## 2022-09-04 DIAGNOSIS — Z791 Long term (current) use of non-steroidal anti-inflammatories (NSAID): Secondary | ICD-10-CM | POA: Diagnosis not present

## 2022-09-04 DIAGNOSIS — Z79899 Other long term (current) drug therapy: Secondary | ICD-10-CM | POA: Diagnosis not present

## 2022-09-04 DIAGNOSIS — Z79890 Hormone replacement therapy (postmenopausal): Secondary | ICD-10-CM | POA: Diagnosis not present

## 2022-09-04 DIAGNOSIS — Z9181 History of falling: Secondary | ICD-10-CM | POA: Diagnosis not present

## 2022-09-04 DIAGNOSIS — Z7901 Long term (current) use of anticoagulants: Secondary | ICD-10-CM | POA: Diagnosis not present

## 2022-09-04 DIAGNOSIS — Z96642 Presence of left artificial hip joint: Secondary | ICD-10-CM | POA: Diagnosis not present

## 2022-09-04 DIAGNOSIS — Z8719 Personal history of other diseases of the digestive system: Secondary | ICD-10-CM | POA: Diagnosis not present

## 2022-09-04 DIAGNOSIS — Z9841 Cataract extraction status, right eye: Secondary | ICD-10-CM | POA: Diagnosis not present

## 2022-09-04 DIAGNOSIS — Z471 Aftercare following joint replacement surgery: Secondary | ICD-10-CM | POA: Diagnosis not present

## 2022-09-04 DIAGNOSIS — K219 Gastro-esophageal reflux disease without esophagitis: Secondary | ICD-10-CM | POA: Diagnosis not present

## 2022-09-04 DIAGNOSIS — M5030 Other cervical disc degeneration, unspecified cervical region: Secondary | ICD-10-CM | POA: Diagnosis not present

## 2022-09-04 DIAGNOSIS — I10 Essential (primary) hypertension: Secondary | ICD-10-CM | POA: Diagnosis not present

## 2022-09-04 DIAGNOSIS — M8580 Other specified disorders of bone density and structure, unspecified site: Secondary | ICD-10-CM | POA: Diagnosis not present

## 2022-09-04 DIAGNOSIS — Z79891 Long term (current) use of opiate analgesic: Secondary | ICD-10-CM | POA: Diagnosis not present

## 2022-09-04 DIAGNOSIS — Z96641 Presence of right artificial hip joint: Secondary | ICD-10-CM | POA: Diagnosis not present

## 2022-09-04 DIAGNOSIS — M810 Age-related osteoporosis without current pathological fracture: Secondary | ICD-10-CM | POA: Diagnosis not present

## 2022-09-17 DIAGNOSIS — I10 Essential (primary) hypertension: Secondary | ICD-10-CM | POA: Diagnosis not present

## 2022-09-17 DIAGNOSIS — E782 Mixed hyperlipidemia: Secondary | ICD-10-CM | POA: Diagnosis not present

## 2022-09-17 DIAGNOSIS — M8580 Other specified disorders of bone density and structure, unspecified site: Secondary | ICD-10-CM | POA: Diagnosis not present

## 2022-09-17 DIAGNOSIS — Z471 Aftercare following joint replacement surgery: Secondary | ICD-10-CM | POA: Diagnosis not present

## 2022-09-17 DIAGNOSIS — M810 Age-related osteoporosis without current pathological fracture: Secondary | ICD-10-CM | POA: Diagnosis not present

## 2022-09-17 DIAGNOSIS — M5030 Other cervical disc degeneration, unspecified cervical region: Secondary | ICD-10-CM | POA: Diagnosis not present

## 2022-09-17 DIAGNOSIS — Z96642 Presence of left artificial hip joint: Secondary | ICD-10-CM | POA: Diagnosis not present

## 2022-11-18 DIAGNOSIS — Z96642 Presence of left artificial hip joint: Secondary | ICD-10-CM | POA: Diagnosis not present

## 2022-11-25 DIAGNOSIS — H43813 Vitreous degeneration, bilateral: Secondary | ICD-10-CM | POA: Diagnosis not present

## 2022-11-25 DIAGNOSIS — H2511 Age-related nuclear cataract, right eye: Secondary | ICD-10-CM | POA: Diagnosis not present

## 2022-11-25 DIAGNOSIS — L309 Dermatitis, unspecified: Secondary | ICD-10-CM | POA: Diagnosis not present

## 2022-11-25 DIAGNOSIS — C44619 Basal cell carcinoma of skin of left upper limb, including shoulder: Secondary | ICD-10-CM | POA: Diagnosis not present

## 2022-11-25 DIAGNOSIS — D0462 Carcinoma in situ of skin of left upper limb, including shoulder: Secondary | ICD-10-CM | POA: Diagnosis not present

## 2022-11-25 DIAGNOSIS — Z961 Presence of intraocular lens: Secondary | ICD-10-CM | POA: Diagnosis not present

## 2022-12-15 DIAGNOSIS — H2511 Age-related nuclear cataract, right eye: Secondary | ICD-10-CM | POA: Diagnosis not present

## 2022-12-16 DIAGNOSIS — D0462 Carcinoma in situ of skin of left upper limb, including shoulder: Secondary | ICD-10-CM | POA: Diagnosis not present

## 2022-12-18 ENCOUNTER — Encounter: Payer: Self-pay | Admitting: Ophthalmology

## 2022-12-18 NOTE — Anesthesia Preprocedure Evaluation (Addendum)
Anesthesia Evaluation  Patient identified by MRN, date of birth, ID band Patient awake    Reviewed: Allergy & Precautions, H&P , NPO status , Patient's Chart, lab work & pertinent test results  Airway Mallampati: II  TM Distance: >3 FB Neck ROM: Full    Dental no notable dental hx.  Caps upper central incisors Multiple capped teeth:   Pulmonary neg pulmonary ROS Environmental allergies   Pulmonary exam normal breath sounds clear to auscultation       Cardiovascular hypertension, Normal cardiovascular exam+ Valvular Problems/Murmurs AS and MR  Rhythm:Regular Rate:Normal + Systolic murmurs Echo 06-04-22 EF >55%, mild AS, mild MR, moderate TR, some calcium on aortic valve.  Hx untreated strep throat as child.  Can hike and walk but does become dyspneic uphill.  States her doc told her to continue to walk uphill as desired. Plans new echo in September 2024   Neuro/Psych negative neurological ROS  negative psych ROS   GI/Hepatic negative GI ROS, Neg liver ROS,GERD  Controlled and Medicated,,diverticulosis   Endo/Other  negative endocrine ROS    Renal/GU negative Renal ROS  negative genitourinary   Musculoskeletal negative musculoskeletal ROS (+) Arthritis , Osteoarthritis,  DDD osteopenia   Abdominal   Peds negative pediatric ROS (+)  Hematology negative hematology ROS (+)   Anesthesia Other Findings Osteoarthritis  Environmental allergies Back pain  Hypertension Diverticulitis  HPV test positive Postmenopausal atrophic vaginitis Menopause Osteopenia  Menopausal and postmenopausal disorder Rectocele Decreased libido Shoulder pain, right  Heart murmur GERD (gastroesophageal reflux disease) Cataract cortical, senile, left DDD (degenerative disc disease), cervical Diverticulosis Hyperlipidemia  Osteoporosis Thyroid nodule  Pneumonia    Reproductive/Obstetrics negative OB ROS                               Anesthesia Physical Anesthesia Plan  ASA: 3  Anesthesia Plan: MAC   Post-op Pain Management:    Induction: Intravenous  PONV Risk Score and Plan:   Airway Management Planned: Natural Airway and Nasal Cannula  Additional Equipment:   Intra-op Plan:   Post-operative Plan: Extubation in OR  Informed Consent: I have reviewed the patients History and Physical, chart, labs and discussed the procedure including the risks, benefits and alternatives for the proposed anesthesia with the patient or authorized representative who has indicated his/her understanding and acceptance.     Dental Advisory Given  Plan Discussed with: Anesthesiologist, CRNA and Surgeon  Anesthesia Plan Comments: (Patient consented for risks of anesthesia including but not limited to:  - adverse reactions to medications - risk of airway placement if required - damage to eyes, teeth, lips or other oral mucosa - nerve damage due to positioning  - sore throat or hoarseness - Damage to heart, brain, nerves, lungs, other parts of body or loss of life  Patient voiced understanding.)         Anesthesia Quick Evaluation

## 2022-12-22 NOTE — Discharge Instructions (Signed)

## 2022-12-24 ENCOUNTER — Other Ambulatory Visit: Payer: Self-pay

## 2022-12-24 ENCOUNTER — Encounter: Payer: Self-pay | Admitting: Ophthalmology

## 2022-12-24 ENCOUNTER — Encounter
Admission: RE | Disposition: A | Discharge status: Home / Self Care | Payer: Self-pay | Source: Home / Self Care | Attending: Ophthalmology

## 2022-12-24 ENCOUNTER — Ambulatory Visit: Payer: PPO | Admitting: Anesthesiology

## 2022-12-24 ENCOUNTER — Ambulatory Visit
Admission: RE | Admit: 2022-12-24 | Discharge: 2022-12-24 | Disposition: A | Discharge status: Home / Self Care | Payer: PPO | Attending: Ophthalmology | Admitting: Ophthalmology

## 2022-12-24 DIAGNOSIS — M1990 Unspecified osteoarthritis, unspecified site: Secondary | ICD-10-CM | POA: Diagnosis not present

## 2022-12-24 DIAGNOSIS — H2511 Age-related nuclear cataract, right eye: Secondary | ICD-10-CM | POA: Insufficient documentation

## 2022-12-24 DIAGNOSIS — I10 Essential (primary) hypertension: Secondary | ICD-10-CM | POA: Diagnosis not present

## 2022-12-24 DIAGNOSIS — K219 Gastro-esophageal reflux disease without esophagitis: Secondary | ICD-10-CM | POA: Diagnosis not present

## 2022-12-24 DIAGNOSIS — E785 Hyperlipidemia, unspecified: Secondary | ICD-10-CM | POA: Diagnosis not present

## 2022-12-24 DIAGNOSIS — M8580 Other specified disorders of bone density and structure, unspecified site: Secondary | ICD-10-CM | POA: Diagnosis not present

## 2022-12-24 HISTORY — PX: CATARACT EXTRACTION W/PHACO: SHX586

## 2022-12-24 SURGERY — PHACOEMULSIFICATION, CATARACT, WITH IOL INSERTION
Anesthesia: Monitor Anesthesia Care | Site: Eye | Laterality: Right

## 2022-12-24 MED ORDER — SIGHTPATH DOSE#1 BSS IO SOLN
INTRAOCULAR | Status: DC | PRN
  Administered 2022-12-24: 15 mL

## 2022-12-24 MED ORDER — MIDAZOLAM HCL 2 MG/2ML IJ SOLN
INTRAMUSCULAR | Status: DC | PRN
  Administered 2022-12-24 (×2): 1 mg via INTRAVENOUS

## 2022-12-24 MED ORDER — SIGHTPATH DOSE#1 BSS IO SOLN
INTRAOCULAR | Status: DC | PRN
  Administered 2022-12-24: 1 mL via INTRAMUSCULAR

## 2022-12-24 MED ORDER — SIGHTPATH DOSE#1 BSS IO SOLN
INTRAOCULAR | Status: DC | PRN
  Administered 2022-12-24: 63 mL via OPHTHALMIC

## 2022-12-24 MED ORDER — TETRACAINE HCL 0.5 % OP SOLN
1.0000 [drp] | OPHTHALMIC | Status: DC | PRN
  Administered 2022-12-24 (×3): 1 [drp] via OPHTHALMIC

## 2022-12-24 MED ORDER — ARMC OPHTHALMIC DILATING DROPS
1.0000 | OPHTHALMIC | Status: DC | PRN
  Administered 2022-12-24 (×3): 1 via OPHTHALMIC

## 2022-12-24 MED ORDER — FENTANYL CITRATE (PF) 100 MCG/2ML IJ SOLN
INTRAMUSCULAR | Status: DC | PRN
  Administered 2022-12-24: 50 ug via INTRAVENOUS

## 2022-12-24 MED ORDER — BRIMONIDINE TARTRATE-TIMOLOL 0.2-0.5 % OP SOLN
OPHTHALMIC | Status: DC | PRN
  Administered 2022-12-24: 1 [drp] via OPHTHALMIC

## 2022-12-24 MED ORDER — SIGHTPATH DOSE#1 NA HYALUR & NA CHOND-NA HYALUR IO KIT
PACK | INTRAOCULAR | Status: DC | PRN
  Administered 2022-12-24: 1 via OPHTHALMIC

## 2022-12-24 MED ORDER — LACTATED RINGERS IV SOLN: INTRAVENOUS | Status: DC

## 2022-12-24 MED ORDER — CEFUROXIME OPHTHALMIC INJECTION 1 MG/0.1 ML
INJECTION | OPHTHALMIC | Status: DC | PRN
  Administered 2022-12-24: .1 mL via INTRACAMERAL

## 2022-12-24 SURGICAL SUPPLY — 9 items
CATARACT SUITE SIGHTPATH (MISCELLANEOUS) ×1 IMPLANT
FEE CATARACT SUITE SIGHTPATH (MISCELLANEOUS) ×1 IMPLANT
GLOVE SRG 8 PF TXTR STRL LF DI (GLOVE) ×1 IMPLANT
GLOVE SURG ENC TEXT LTX SZ7.5 (GLOVE) ×1 IMPLANT
GLOVE SURG UNDER POLY LF SZ8 (GLOVE) ×1
LENS IOL TECNIS EYHANCE 22.0 (Intraocular Lens) IMPLANT
NDL FILTER BLUNT 18X1 1/2 (NEEDLE) ×1 IMPLANT
NEEDLE FILTER BLUNT 18X1 1/2 (NEEDLE) ×1 IMPLANT
SYR 3ML LL SCALE MARK (SYRINGE) ×1 IMPLANT

## 2022-12-24 NOTE — H&P (Signed)
[facility]   Primary Care Physician:  Danella Penton, MD Ophthalmologist: Dr. Lockie Mola  Pre-Procedure History & Physical: HPI:  Christina Foley is a 77 y.o. female here for ophthalmic surgery.   Past Medical History:  Diagnosis Date   Back pain    Cataract cortical, senile, left    DDD (degenerative disc disease), cervical    Decreased libido    Diverticulitis    Diverticulosis    Environmental allergies    seasonal   GERD (gastroesophageal reflux disease)    Heart murmur    1970s   HPV test positive    Hyperlipidemia    Hypertension    Menopausal and postmenopausal disorder    Menopause    Osteoarthritis    Osteopenia    Osteoporosis    Pneumonia    Postmenopausal atrophic vaginitis    Rectocele    Shoulder pain, right    rotator cuff tear   Thyroid nodule     Past Surgical History:  Procedure Laterality Date   BREAST CYST ASPIRATION Bilateral 2000   benign   BUNIONECTOMY Left    CATARACT EXTRACTION Left    COLONOSCOPY     1993, 2005, 2009   COLONOSCOPY WITH PROPOFOL N/A 03/31/2018   Procedure: COLONOSCOPY WITH PROPOFOL;  Surgeon: Scot Jun, MD;  Location: [facility] ENDOSCOPY;  Service: Endoscopy;  Laterality: N/A;   CRYOTHERAPY     ESOPHAGOGASTRODUODENOSCOPY  1993   ESOPHAGOGASTRODUODENOSCOPY  05/24/2020   ESOPHAGOGASTRODUODENOSCOPY (EGD) WITH PROPOFOL N/A 03/31/2018   Procedure: ESOPHAGOGASTRODUODENOSCOPY (EGD) WITH PROPOFOL;  Surgeon: Scot Jun, MD;  Location: [facility] ENDOSCOPY;  Service: Endoscopy;  Laterality: N/A;   HARDWARE REMOVAL Left 02/04/2022   Procedure: Left little finger hardware removal;  Surgeon: Kennedy Bucker, MD;  Location: [facility] ORS;  Service: Orthopedics;  Laterality: Left;   OPEN REDUCTION INTERNAL FIXATION (ORIF) PROXIMAL PHALANX Left 11/14/2021   Procedure: Left, little finger proximal phalanx ORIF;  Surgeon: Kennedy Bucker, MD;  Location: [facility] ORS;  Service: Orthopedics;  Laterality: Left;  left little  finger   SHOULDER ARTHROSCOPY Right 10/28/2017   Procedure: ARTHROSCOPY SHOULDER EITH DEBRIDEMENT DECOMPRESSIOIN AND REAPIR OF ROTATOR CUFF REPAIR;  Surgeon: Christena Flake, MD;  Location: [facility];  Service: Orthopedics;  Laterality: Right;   TOTAL HIP ARTHROPLASTY Right 08/07/2014   TOTAL HIP ARTHROPLASTY Left 08/07/2022   Procedure: TOTAL HIP ARTHROPLASTY ANTERIOR APPROACH;  Surgeon: Reinaldo Berber, MD;  Location: [facility] ORS;  Service: Orthopedics;  Laterality: Left;   TUBAL LIGATION      Prior to Admission medications   Medication Sig Start Date End Date Taking? Authorizing Provider  acetaminophen (TYLENOL) 500 MG tablet Take 500-1,000 mg by mouth every 8 (eight) hours as needed for moderate pain.   Yes [provider]  ALPRAZolam (XANAX) 0.25 MG tablet Take 0.25 mg by mouth daily as needed for anxiety (when traveling). 05/03/15  Yes [provider]  Biotin 2500 MCG CAPS Take 2,500 mcg by mouth daily.   Yes [provider]  Calcium-Cholecalciferol-Zinc (VIACTIV CALCIUM IMMUNE) 650-20-5.5 MG-MCG-MG CHEW Chew 1 each by mouth daily.   Yes [provider]  Cholecalciferol (VITAMIN D3) 50 MCG (2000 UT) capsule Take 2,000 Units by mouth daily.   Yes [provider]  dicyclomine (BENTYL) 20 MG tablet Take 20 mg by mouth as needed for spasms.   Yes [provider]  diphenoxylate-atropine (LOMOTIL) 2.5-0.025 MG tablet Take 1 tablet by mouth 4 (four) times daily as needed for diarrhea or loose  stools. 05/03/15  Yes [provider]  docusate sodium (COLACE) 100 MG capsule Take 1 capsule (100 mg total) by mouth 2 (two) times daily. 08/08/22  Yes Evon Slack, PA-C  EPINEPHrine 0.3 mg/0.3 mL IJ SOAJ injection Inject 0.3 mg into the muscle as needed for anaphylaxis.   Yes [provider]  estradiol (ESTRACE VAGINAL) 0.1 MG/GM vaginal cream Place 0.5 Applicatorfuls vaginally 2 (two) times a week. Patient taking differently:  Place 0.5 Applicatorfuls vaginally once a week. 03/19/17  Yes Defrancesco, Prentice Docker, MD  famotidine (PEPCID) 20 MG tablet Take 20 mg by mouth at bedtime.   Yes [provider]  fluticasone (FLONASE) 50 MCG/ACT nasal spray Place 2 sprays into both nostrils at bedtime.   Yes [provider]  gabapentin (NEURONTIN) 100 MG capsule Take 100 mg by mouth at bedtime.   Yes [provider]  loratadine (CLARITIN) 10 MG tablet Take 10 mg by mouth every morning.   Yes [provider]  minoxidil (LONITEN) 2.5 MG tablet Take 1.25 mg by mouth every morning.   Yes [provider]  pantoprazole (PROTONIX) 40 MG tablet Take 40 mg by mouth 2 (two) times daily.   Yes [provider]  Probiotic Product (PROBIOTIC DAILY PO) Take 1 capsule by mouth daily.   Yes [provider]  Turmeric 500 MG CAPS Take 500 mg by mouth daily.   Yes [provider]  vitamin B-12 (CYANOCOBALAMIN) 500 MCG tablet Take 500 mcg by mouth daily.   Yes [provider]  vitamin C (ASCORBIC ACID) 500 MG tablet Take 500 mg by mouth daily.   Yes [provider]  methocarbamol (ROBAXIN) 500 MG tablet Take 500 mg by mouth every 8 (eight) hours as needed for muscle spasms. Patient not taking: Reported on 12/18/2022    [provider]  ondansetron (ZOFRAN) 4 MG tablet Take 1 tablet (4 mg total) by mouth every 6 (six) hours as needed for nausea. Patient not taking: Reported on 12/18/2022 08/08/22   Evon Slack, PA-C  traMADol (ULTRAM) 50 MG tablet Take 1 tablet (50 mg total) by mouth every 6 (six) hours as needed for moderate pain. Patient not taking: Reported on 12/18/2022 08/08/22   Evon Slack, PA-C    Allergies as of 11/28/2022 - Review Complete 08/07/2022  Allergen Reaction Noted   Fosamax [alendronate] Other (See Comments) 09/11/2016    Family History  Problem Relation Age of Onset   Breast cancer Maternal Aunt 78   Thyroid cancer Mother     Heart disease Father    Hypertension Father    Depression Paternal Grandmother    Hypertension Brother    Ovarian cancer Neg Hx     Social History   Socioeconomic History   Marital status: Married    Spouse name: Greggory Stallion   Number of children: 2   Years of education: Not on file   Highest education level: Not on file  Occupational History   Not on file  Tobacco Use   Smoking status: Never   Smokeless tobacco: Never  Vaping Use   Vaping Use: Never used  Substance and Sexual Activity   Alcohol use: Yes    Alcohol/week: 2.0 standard drinks of alcohol    Types: 2 Standard drinks or equivalent per week    Comment: occassionally   Drug use: No   Sexual activity: Yes    Birth control/protection: Post-menopausal  Other Topics Concern   Not on file  Social History Narrative  Not on file   Social Determinants of Health   Financial Resource Strain: Not on file  Food Insecurity: No Food Insecurity (08/07/2022)   Hunger Vital Sign    Worried About Running Out of Food in the Last Year: Never true    Ran Out of Food in the Last Year: Never true  Transportation Needs: No Transportation Needs (08/07/2022)   PRAPARE - Administrator, Civil Service (Medical): No    Lack of Transportation (Non-Medical): No  Physical Activity: Sufficiently Active (03/18/2018)   Exercise Vital Sign    Days of Exercise per Week: 5 days    Minutes of Exercise per Session: 30 min  Stress: Not on file  Social Connections: Not on file  Intimate Partner Violence: Not At Risk (08/07/2022)   Humiliation, Afraid, Rape, and Kick questionnaire    Fear of Current or Ex-Partner: No    Emotionally Abused: No    Physically Abused: No    Sexually Abused: No    Review of Systems: See HPI, otherwise negative ROS  Physical Exam: BP (!) 156/97   Temp 98.1 F (36.7 C) (Temporal)   Resp (!) 21   Ht  (1.575 m)   Wt 64.8 kg   SpO2 98%   BMI 26.14 kg/m  General:   Alert,  pleasant and  cooperative in NAD Head:  Normocephalic and atraumatic. Lungs:  Clear to auscultation.    Heart:  Regular rate and rhythm.   Impression/Plan: Kathrin Ruddy is here for ophthalmic surgery.  Risks, benefits, limitations, and alternatives regarding ophthalmic surgery have been reviewed with the patient.  Questions have been answered.  All parties agreeable.   Lockie Mola, MD  12/24/2022, 9:46 AM

## 2022-12-24 NOTE — Anesthesia Postprocedure Evaluation (Signed)
Anesthesia Post Note  Patient: Christina Foley  Procedure(s) Performed: CATARACT EXTRACTION PHACO AND INTRAOCULAR LENS PLACEMENT (IOC) RIGHT  8.01  00:48.1 (Right: Eye)  Patient location during evaluation: PACU Anesthesia Type: MAC Level of consciousness: awake and alert Pain management: pain level controlled Vital Signs Assessment: post-procedure vital signs reviewed and stable Respiratory status: spontaneous breathing, nonlabored ventilation, respiratory function stable and patient connected to nasal cannula oxygen Cardiovascular status: stable and blood pressure returned to baseline Postop Assessment: no apparent nausea or vomiting Anesthetic complications: no   No notable events documented.   Last Vitals:  Vitals:   12/24/22 1104 12/24/22 1108  BP: 130/61 (!) 141/74  Pulse: 66 74  Resp: 14 15  Temp: 36.6 C 36.6 C  SpO2: 99% 97%    Last Pain:  Vitals:   12/24/22 1108  TempSrc:   PainSc: 0-No pain                 Shyniece Scripter C Sojourner Behringer

## 2022-12-24 NOTE — Transfer of Care (Signed)
Immediate Anesthesia Transfer of Care Note  Patient: Christina Foley  Procedure(s) Performed: CATARACT EXTRACTION PHACO AND INTRAOCULAR LENS PLACEMENT (IOC) RIGHT  8.01  00:48.1 (Right: Eye)  Patient Location: PACU  Anesthesia Type: MAC  Level of Consciousness: awake, alert  and patient cooperative  Airway and Oxygen Therapy: Patient Spontanous Breathing and Patient connected to supplemental oxygen  Post-op Assessment: Post-op Vital signs reviewed, Patient's Cardiovascular Status Stable, Respiratory Function Stable, Patent Airway and No signs of Nausea or vomiting  Post-op Vital Signs: Reviewed and stable  Complications: No notable events documented.

## 2022-12-24 NOTE — Op Note (Signed)
LOCATION:  [facility]   PREOPERATIVE DIAGNOSIS:    Nuclear sclerotic cataract right eye. H25.11   POSTOPERATIVE DIAGNOSIS:  Nuclear sclerotic cataract right eye.     PROCEDURE:  Phacoemusification with posterior chamber intraocular lens placement of the right eye   ULTRASOUND TIME: Procedure(s): CATARACT EXTRACTION PHACO AND INTRAOCULAR LENS PLACEMENT (IOC) RIGHT  8.01  00:48.1 (Right)  LENS:   Implant Name Type Inv. Item Serial No. Manufacturer Lot No. LRB No. Used Action  LENS IOL TECNIS EYHANCE 22.0 - [id number] Intraocular Lens LENS IOL TECNIS EYHANCE 22.0 [id number] [employer]  Right 1 Implanted         SURGEON:  Deirdre Evener, MD   ANESTHESIA:  Topical with tetracaine drops and 2% Xylocaine jelly, augmented with 1% preservative-free intracameral lidocaine.    COMPLICATIONS:  None.   DESCRIPTION OF PROCEDURE:  The patient was identified in the holding room and transported to the operating room and placed in the supine position under the operating microscope.  The right eye was identified as the operative eye and it was prepped and draped in the usual sterile ophthalmic fashion.   A 1 millimeter clear-corneal paracentesis was made at the 12:00 position.  0.5 ml of preservative-free 1% lidocaine was injected into the anterior chamber. The anterior chamber was filled with Viscoat viscoelastic.  A 2.4 millimeter keratome was used to make a near-clear corneal incision at the 9:00 position.  A curvilinear capsulorrhexis was made with a cystotome and capsulorrhexis forceps.  Balanced salt solution was used to hydrodissect and hydrodelineate the nucleus.   Phacoemulsification was then used in stop and chop fashion to remove the lens nucleus and epinucleus.  The remaining cortex was then removed using the irrigation and aspiration handpiece. Provisc was then placed into the capsular bag to distend it for lens placement.  A lens was then injected into the capsular bag.   The remaining viscoelastic was aspirated.   Wounds were hydrated with balanced salt solution.  The anterior chamber was inflated to a physiologic pressure with balanced salt solution.  No wound leaks were noted. Cefuroxime 0.1 ml of a 10mg /ml solution was injected into the anterior chamber for a dose of 1 mg of intracameral antibiotic at the completion of the case.   Timolol and Brimonidine drops were applied to the eye.  The patient was taken to the recovery room in stable condition without complications of anesthesia or surgery.   Pam Vanalstine 12/24/2022, 11:02 AM

## 2022-12-25 ENCOUNTER — Encounter: Payer: Self-pay | Admitting: Ophthalmology

## 2023-01-20 ENCOUNTER — Other Ambulatory Visit: Payer: Self-pay | Admitting: Internal Medicine

## 2023-01-20 DIAGNOSIS — Z1231 Encounter for screening mammogram for malignant neoplasm of breast: Secondary | ICD-10-CM

## 2023-01-20 DIAGNOSIS — H52223 Regular astigmatism, bilateral: Secondary | ICD-10-CM | POA: Diagnosis not present

## 2023-01-27 ENCOUNTER — Ambulatory Visit
Admission: RE | Admit: 2023-01-27 | Discharge: 2023-01-27 | Disposition: A | Discharge status: Home / Self Care | Payer: PPO | Source: Ambulatory Visit | Attending: Internal Medicine | Admitting: Internal Medicine

## 2023-01-27 DIAGNOSIS — I10 Essential (primary) hypertension: Secondary | ICD-10-CM | POA: Diagnosis not present

## 2023-01-27 DIAGNOSIS — K2100 Gastro-esophageal reflux disease with esophagitis, without bleeding: Secondary | ICD-10-CM | POA: Diagnosis not present

## 2023-01-27 DIAGNOSIS — A09 Infectious gastroenteritis and colitis, unspecified: Secondary | ICD-10-CM | POA: Diagnosis not present

## 2023-01-27 DIAGNOSIS — Z1231 Encounter for screening mammogram for malignant neoplasm of breast: Secondary | ICD-10-CM

## 2023-05-28 DIAGNOSIS — R739 Hyperglycemia, unspecified: Secondary | ICD-10-CM | POA: Diagnosis not present

## 2023-05-28 DIAGNOSIS — M818 Other osteoporosis without current pathological fracture: Secondary | ICD-10-CM | POA: Diagnosis not present

## 2023-05-28 DIAGNOSIS — E782 Mixed hyperlipidemia: Secondary | ICD-10-CM | POA: Diagnosis not present

## 2023-06-04 DIAGNOSIS — M818 Other osteoporosis without current pathological fracture: Secondary | ICD-10-CM | POA: Diagnosis not present

## 2023-06-04 DIAGNOSIS — E782 Mixed hyperlipidemia: Secondary | ICD-10-CM | POA: Diagnosis not present

## 2023-06-04 DIAGNOSIS — Z0000 Encounter for general adult medical examination without abnormal findings: Secondary | ICD-10-CM | POA: Diagnosis not present

## 2023-06-04 DIAGNOSIS — I350 Nonrheumatic aortic (valve) stenosis: Secondary | ICD-10-CM | POA: Diagnosis not present

## 2023-06-17 DIAGNOSIS — I350 Nonrheumatic aortic (valve) stenosis: Secondary | ICD-10-CM | POA: Diagnosis not present

## 2023-07-13 DIAGNOSIS — L570 Actinic keratosis: Secondary | ICD-10-CM | POA: Diagnosis not present

## 2023-07-13 DIAGNOSIS — D2271 Melanocytic nevi of right lower limb, including hip: Secondary | ICD-10-CM | POA: Diagnosis not present

## 2023-07-13 DIAGNOSIS — X32XXXA Exposure to sunlight, initial encounter: Secondary | ICD-10-CM | POA: Diagnosis not present

## 2023-07-13 DIAGNOSIS — L821 Other seborrheic keratosis: Secondary | ICD-10-CM | POA: Diagnosis not present

## 2023-07-13 DIAGNOSIS — D225 Melanocytic nevi of trunk: Secondary | ICD-10-CM | POA: Diagnosis not present

## 2023-07-13 DIAGNOSIS — D2261 Melanocytic nevi of right upper limb, including shoulder: Secondary | ICD-10-CM | POA: Diagnosis not present

## 2023-07-13 DIAGNOSIS — Z85828 Personal history of other malignant neoplasm of skin: Secondary | ICD-10-CM | POA: Diagnosis not present

## 2023-07-13 DIAGNOSIS — Z872 Personal history of diseases of the skin and subcutaneous tissue: Secondary | ICD-10-CM | POA: Diagnosis not present

## 2023-07-13 DIAGNOSIS — D2262 Melanocytic nevi of left upper limb, including shoulder: Secondary | ICD-10-CM | POA: Diagnosis not present

## 2023-07-13 DIAGNOSIS — D2272 Melanocytic nevi of left lower limb, including hip: Secondary | ICD-10-CM | POA: Diagnosis not present

## 2023-07-22 DIAGNOSIS — Z791 Long term (current) use of non-steroidal anti-inflammatories (NSAID): Secondary | ICD-10-CM | POA: Diagnosis not present

## 2023-07-22 DIAGNOSIS — M150 Primary generalized (osteo)arthritis: Secondary | ICD-10-CM | POA: Diagnosis not present

## 2023-07-28 DIAGNOSIS — H43813 Vitreous degeneration, bilateral: Secondary | ICD-10-CM | POA: Diagnosis not present

## 2023-07-28 DIAGNOSIS — Z961 Presence of intraocular lens: Secondary | ICD-10-CM | POA: Diagnosis not present

## 2023-07-28 DIAGNOSIS — H11001 Unspecified pterygium of right eye: Secondary | ICD-10-CM | POA: Diagnosis not present

## 2023-07-29 ENCOUNTER — Other Ambulatory Visit: Payer: Self-pay | Admitting: Medical Genetics

## 2023-07-29 DIAGNOSIS — Z006 Encounter for examination for normal comparison and control in clinical research program: Secondary | ICD-10-CM

## 2023-07-31 ENCOUNTER — Other Ambulatory Visit
Admission: RE | Admit: 2023-07-31 | Discharge: 2023-07-31 | Disposition: A | Discharge status: Home / Self Care | Payer: PPO | Source: Ambulatory Visit | Attending: Medical Genetics | Admitting: Medical Genetics

## 2023-07-31 DIAGNOSIS — Z006 Encounter for examination for normal comparison and control in clinical research program: Secondary | ICD-10-CM | POA: Insufficient documentation

## 2023-07-31 DIAGNOSIS — Z96642 Presence of left artificial hip joint: Secondary | ICD-10-CM | POA: Diagnosis not present

## 2023-08-10 DIAGNOSIS — I872 Venous insufficiency (chronic) (peripheral): Secondary | ICD-10-CM | POA: Diagnosis not present

## 2023-08-16 LAB — GENECONNECT MOLECULAR SCREEN: Genetic Analysis Overall Interpretation: NEGATIVE

## 2023-08-28 DIAGNOSIS — H02831 Dermatochalasis of right upper eyelid: Secondary | ICD-10-CM | POA: Diagnosis not present

## 2023-09-17 DIAGNOSIS — I872 Venous insufficiency (chronic) (peripheral): Secondary | ICD-10-CM | POA: Diagnosis not present

## 2023-09-23 DIAGNOSIS — M7061 Trochanteric bursitis, right hip: Secondary | ICD-10-CM | POA: Diagnosis not present

## 2023-10-22 DIAGNOSIS — Z09 Encounter for follow-up examination after completed treatment for conditions other than malignant neoplasm: Secondary | ICD-10-CM | POA: Diagnosis not present

## 2023-10-22 DIAGNOSIS — I872 Venous insufficiency (chronic) (peripheral): Secondary | ICD-10-CM | POA: Diagnosis not present

## 2023-11-05 DIAGNOSIS — I872 Venous insufficiency (chronic) (peripheral): Secondary | ICD-10-CM | POA: Diagnosis not present

## 2023-11-05 DIAGNOSIS — Z09 Encounter for follow-up examination after completed treatment for conditions other than malignant neoplasm: Secondary | ICD-10-CM | POA: Diagnosis not present

## 2023-11-11 DIAGNOSIS — I872 Venous insufficiency (chronic) (peripheral): Secondary | ICD-10-CM | POA: Diagnosis not present

## 2023-11-11 DIAGNOSIS — Z01818 Encounter for other preprocedural examination: Secondary | ICD-10-CM | POA: Diagnosis not present

## 2023-11-19 DIAGNOSIS — Z01818 Encounter for other preprocedural examination: Secondary | ICD-10-CM | POA: Diagnosis not present

## 2023-11-19 DIAGNOSIS — I872 Venous insufficiency (chronic) (peripheral): Secondary | ICD-10-CM | POA: Diagnosis not present

## 2023-11-23 DIAGNOSIS — Z01818 Encounter for other preprocedural examination: Secondary | ICD-10-CM | POA: Diagnosis not present

## 2023-11-23 DIAGNOSIS — I872 Venous insufficiency (chronic) (peripheral): Secondary | ICD-10-CM | POA: Diagnosis not present

## 2023-12-03 DIAGNOSIS — L03032 Cellulitis of left toe: Secondary | ICD-10-CM | POA: Diagnosis not present

## 2023-12-03 DIAGNOSIS — B351 Tinea unguium: Secondary | ICD-10-CM | POA: Diagnosis not present

## 2024-01-18 ENCOUNTER — Other Ambulatory Visit: Payer: Self-pay | Admitting: Internal Medicine

## 2024-01-18 DIAGNOSIS — Z1231 Encounter for screening mammogram for malignant neoplasm of breast: Secondary | ICD-10-CM

## 2024-01-19 DIAGNOSIS — Z791 Long term (current) use of non-steroidal anti-inflammatories (NSAID): Secondary | ICD-10-CM | POA: Diagnosis not present

## 2024-01-19 DIAGNOSIS — M150 Primary generalized (osteo)arthritis: Secondary | ICD-10-CM | POA: Diagnosis not present

## 2024-01-26 DIAGNOSIS — M79672 Pain in left foot: Secondary | ICD-10-CM | POA: Diagnosis not present

## 2024-01-26 DIAGNOSIS — M76822 Posterior tibial tendinitis, left leg: Secondary | ICD-10-CM | POA: Diagnosis not present

## 2024-02-03 ENCOUNTER — Ambulatory Visit
Admission: RE | Admit: 2024-02-03 | Discharge: 2024-02-03 | Disposition: A | Discharge status: Home / Self Care | Source: Ambulatory Visit | Attending: Internal Medicine | Admitting: Internal Medicine

## 2024-02-03 DIAGNOSIS — Z1231 Encounter for screening mammogram for malignant neoplasm of breast: Secondary | ICD-10-CM | POA: Insufficient documentation

## 2024-05-30 DIAGNOSIS — Z79899 Other long term (current) drug therapy: Secondary | ICD-10-CM | POA: Diagnosis not present

## 2024-05-30 DIAGNOSIS — E782 Mixed hyperlipidemia: Secondary | ICD-10-CM | POA: Diagnosis not present

## 2024-05-30 DIAGNOSIS — M818 Other osteoporosis without current pathological fracture: Secondary | ICD-10-CM | POA: Diagnosis not present

## 2024-06-06 DIAGNOSIS — E782 Mixed hyperlipidemia: Secondary | ICD-10-CM | POA: Diagnosis not present

## 2024-06-06 DIAGNOSIS — I350 Nonrheumatic aortic (valve) stenosis: Secondary | ICD-10-CM | POA: Diagnosis not present

## 2024-06-06 DIAGNOSIS — Z0000 Encounter for general adult medical examination without abnormal findings: Secondary | ICD-10-CM | POA: Diagnosis not present

## 2024-06-06 DIAGNOSIS — Z79899 Other long term (current) drug therapy: Secondary | ICD-10-CM | POA: Diagnosis not present

## 2024-06-06 DIAGNOSIS — M818 Other osteoporosis without current pathological fracture: Secondary | ICD-10-CM | POA: Diagnosis not present

## 2024-06-06 DIAGNOSIS — R739 Hyperglycemia, unspecified: Secondary | ICD-10-CM | POA: Diagnosis not present

## 2024-06-07 DIAGNOSIS — M76822 Posterior tibial tendinitis, left leg: Secondary | ICD-10-CM | POA: Diagnosis not present

## 2024-07-11 DIAGNOSIS — M810 Age-related osteoporosis without current pathological fracture: Secondary | ICD-10-CM | POA: Diagnosis not present

## 2024-07-14 DIAGNOSIS — D2272 Melanocytic nevi of left lower limb, including hip: Secondary | ICD-10-CM | POA: Diagnosis not present

## 2024-07-14 DIAGNOSIS — Z85828 Personal history of other malignant neoplasm of skin: Secondary | ICD-10-CM | POA: Diagnosis not present

## 2024-07-14 DIAGNOSIS — D2261 Melanocytic nevi of right upper limb, including shoulder: Secondary | ICD-10-CM | POA: Diagnosis not present

## 2024-07-14 DIAGNOSIS — D2262 Melanocytic nevi of left upper limb, including shoulder: Secondary | ICD-10-CM | POA: Diagnosis not present

## 2024-07-14 DIAGNOSIS — D225 Melanocytic nevi of trunk: Secondary | ICD-10-CM | POA: Diagnosis not present

## 2024-07-14 DIAGNOSIS — L821 Other seborrheic keratosis: Secondary | ICD-10-CM | POA: Diagnosis not present

## 2024-07-18 DIAGNOSIS — M810 Age-related osteoporosis without current pathological fracture: Secondary | ICD-10-CM | POA: Diagnosis not present

## 2024-08-01 DIAGNOSIS — Z961 Presence of intraocular lens: Secondary | ICD-10-CM | POA: Diagnosis not present

## 2024-08-01 DIAGNOSIS — H11001 Unspecified pterygium of right eye: Secondary | ICD-10-CM | POA: Diagnosis not present

## 2024-08-01 DIAGNOSIS — H04123 Dry eye syndrome of bilateral lacrimal glands: Secondary | ICD-10-CM | POA: Diagnosis not present

## 2024-08-01 DIAGNOSIS — H43813 Vitreous degeneration, bilateral: Secondary | ICD-10-CM | POA: Diagnosis not present

## 2024-08-02 DIAGNOSIS — Z96642 Presence of left artificial hip joint: Secondary | ICD-10-CM | POA: Diagnosis not present
